# Patient Record
Sex: Female | Born: 1945 | Race: White | Hispanic: No | Marital: Married | Smoking: Former smoker
Health system: Southern US, Community
[De-identification: ages and names within clinical notes are randomized; demographics above are authoritative.]

## PROBLEM LIST (undated history)

## (undated) DIAGNOSIS — L259 Unspecified contact dermatitis, unspecified cause: Secondary | ICD-10-CM

## (undated) DIAGNOSIS — E785 Hyperlipidemia, unspecified: Secondary | ICD-10-CM

## (undated) DIAGNOSIS — N183 Chronic kidney disease, stage 3 unspecified: Secondary | ICD-10-CM

## (undated) DIAGNOSIS — M1990 Unspecified osteoarthritis, unspecified site: Secondary | ICD-10-CM

## (undated) DIAGNOSIS — Z124 Encounter for screening for malignant neoplasm of cervix: Secondary | ICD-10-CM

## (undated) DIAGNOSIS — H269 Unspecified cataract: Secondary | ICD-10-CM

## (undated) DIAGNOSIS — M25579 Pain in unspecified ankle and joints of unspecified foot: Secondary | ICD-10-CM

## (undated) DIAGNOSIS — T7840XA Allergy, unspecified, initial encounter: Secondary | ICD-10-CM

## (undated) DIAGNOSIS — I10 Essential (primary) hypertension: Secondary | ICD-10-CM

## (undated) DIAGNOSIS — K219 Gastro-esophageal reflux disease without esophagitis: Secondary | ICD-10-CM

## (undated) DIAGNOSIS — F32A Depression, unspecified: Secondary | ICD-10-CM

## (undated) DIAGNOSIS — F329 Major depressive disorder, single episode, unspecified: Secondary | ICD-10-CM

## (undated) DIAGNOSIS — M8580 Other specified disorders of bone density and structure, unspecified site: Secondary | ICD-10-CM

## (undated) DIAGNOSIS — Z79899 Other long term (current) drug therapy: Secondary | ICD-10-CM

## (undated) HISTORY — PX: FINGER SURGERY: SHX640

## (undated) HISTORY — DX: Chronic kidney disease, stage 3 unspecified: N18.30

## (undated) HISTORY — PX: JOINT REPLACEMENT: SHX530

## (undated) HISTORY — DX: Unspecified cataract: H26.9

## (undated) HISTORY — PX: UPPER GASTROINTESTINAL ENDOSCOPY: SHX188

## (undated) HISTORY — DX: Encounter for screening for malignant neoplasm of cervix: Z12.4

## (undated) HISTORY — DX: Unspecified contact dermatitis, unspecified cause: L25.9

## (undated) HISTORY — DX: Hyperlipidemia, unspecified: E78.5

## (undated) HISTORY — DX: Other specified disorders of bone density and structure, unspecified site: M85.80

## (undated) HISTORY — PX: TONSILLECTOMY: SUR1361

## (undated) HISTORY — DX: Allergy, unspecified, initial encounter: T78.40XA

## (undated) HISTORY — DX: Unspecified osteoarthritis, unspecified site: M19.90

## (undated) HISTORY — DX: Pain in unspecified ankle and joints of unspecified foot: M25.579

## (undated) HISTORY — PX: OTHER SURGICAL HISTORY: SHX169

## (undated) HISTORY — DX: Gastro-esophageal reflux disease without esophagitis: K21.9

## (undated) HISTORY — PX: COLONOSCOPY: SHX174

## (undated) HISTORY — DX: Other long term (current) drug therapy: Z79.899

---

## 1967-02-11 HISTORY — PX: RHINOPLASTY: SUR1284

## 1978-02-10 HISTORY — PX: TUBAL LIGATION: SHX77

## 1978-02-10 HISTORY — PX: DIAGNOSTIC LAPAROSCOPY: SUR761

## 2003-12-15 ENCOUNTER — Encounter: Admission: RE | Admit: 2003-12-15 | Discharge: 2003-12-15 | Payer: Self-pay | Admitting: Family Medicine

## 2004-12-16 ENCOUNTER — Encounter: Admission: RE | Admit: 2004-12-16 | Discharge: 2004-12-16 | Payer: Self-pay | Admitting: Internal Medicine

## 2005-12-17 ENCOUNTER — Encounter: Admission: RE | Admit: 2005-12-17 | Discharge: 2005-12-17 | Payer: Self-pay | Admitting: *Deleted

## 2005-12-18 ENCOUNTER — Ambulatory Visit: Payer: Self-pay | Admitting: Internal Medicine

## 2006-01-06 ENCOUNTER — Ambulatory Visit: Payer: Self-pay | Admitting: Internal Medicine

## 2006-02-27 ENCOUNTER — Encounter: Admission: RE | Admit: 2006-02-27 | Discharge: 2006-02-27 | Payer: Self-pay | Admitting: Orthopedic Surgery

## 2006-05-12 HISTORY — PX: ANKLE SURGERY: SHX546

## 2006-12-31 ENCOUNTER — Encounter: Admission: RE | Admit: 2006-12-31 | Discharge: 2006-12-31 | Payer: Self-pay | Admitting: *Deleted

## 2007-05-12 HISTORY — PX: KNEE ARTHROPLASTY: SHX992

## 2007-11-05 ENCOUNTER — Inpatient Hospital Stay (HOSPITAL_COMMUNITY): Admission: RE | Admit: 2007-11-05 | Discharge: 2007-11-08 | Payer: Self-pay | Admitting: Orthopedic Surgery

## 2008-01-03 ENCOUNTER — Encounter: Admission: RE | Admit: 2008-01-03 | Discharge: 2008-01-03 | Payer: Self-pay | Admitting: *Deleted

## 2009-01-31 ENCOUNTER — Encounter: Admission: RE | Admit: 2009-01-31 | Discharge: 2009-01-31 | Payer: Self-pay | Admitting: Internal Medicine

## 2009-12-04 ENCOUNTER — Ambulatory Visit (HOSPITAL_COMMUNITY): Admission: RE | Admit: 2009-12-04 | Discharge: 2009-12-04 | Payer: Self-pay | Admitting: Orthopedic Surgery

## 2010-03-02 ENCOUNTER — Encounter: Payer: Self-pay | Admitting: Orthopedic Surgery

## 2010-05-13 LAB — HM MAMMOGRAPHY: HM Mammogram: NEGATIVE

## 2010-06-28 NOTE — Discharge Summary (Signed)
[name], [name]          ACCOUNT NO.:  [id number]   MEDICAL RECORD NO.:  [id number]          PATIENT TYPE:  INP   LOCATION:  5024                         FACILITY:  [facility]   PHYSICIAN:  Almedia Balls. Ranell Patrick, M.D. DATE OF BIRTH:  Jan 24, 1946   DATE OF ADMISSION:  11/05/2007  DATE OF DISCHARGE:  11/08/2007                               DISCHARGE SUMMARY   ADMITTING DIAGNOSIS:  Left knee end-stage osteoarthritis.   DISCHARGE DIAGNOSIS:  Left knee end-stage osteoarthritis.   PROCEDURE PERFORMED:  Left total knee replacement performed on November 05, 2007.   HISTORY OF PRESENT ILLNESS:  The patient is a 65 year old female with  worsening arthritis of the left knee.  She has failed conservative  management consistent with arthroscopy as well as injections.  Viscosupplementation therapy active modification presents now for end-  stage arthritis of the knee with disabling pain and functional loss,  desires a total knee replacement.  For further details in patient's past  medical history and physical examination, please see the medical record.   HOSPITAL COURSE:  The patient admitted to Orthopedics on November 05, 2007, where the patient underwent uncomplicated total knee arthroplasty.  The patient did well postoperatively, is up and ambulating and  weightbearing as tolerated.  She was comfortable on oral medication  prior to discharge.  She was discharged on regular diet.  Stable  condition on anticoagulant including Coumadin.  The patient to be  anticoagulated for 30 days.  She will follow up in Orthopedics in 2  weeks.      Almedia Balls. Ranell Patrick, M.D.  Electronically Signed     SRN/MEDQ  D:  12/14/2007  T:  12/15/2007  Job:  [id number]

## 2010-06-28 NOTE — Op Note (Signed)
[name], [name]          ACCOUNT NO.:  [id number]   MEDICAL RECORD NO.:  [id number]          PATIENT TYPE:  INP   LOCATION:  5024                         FACILITY:  [facility]   PHYSICIAN:  Almedia Balls. Ranell Patrick, M.D. DATE OF BIRTH:  03/15/1945   DATE OF PROCEDURE:  DATE OF DISCHARGE:  11/08/2007                               OPERATIVE REPORT   PREOPERATIVE DIAGNOSIS:  Left knee osteoarthritis, end stage.   POSTOPERATIVE DIAGNOSIS:  Left knee osteoarthritis, end stage.   PROCEDURE PERFORMED:  Left knee total knee replacement using a DePuy  segment rotating platform prosthesis.   ATTENDING SURGEON:  Almedia Balls. Ranell Patrick, MD   ASSISTANTAlbin Felling, one of the nurses in the [facility] operating room.   ANESTHESIA:  Spinal anesthesia was used.   ESTIMATED BLOOD LOSS:  Minimal.   FLUID REPLACEMENT:  2000 mL crystalloid.   TOURNIQUET TIME:  One hour and thirty minutes at 300 mmHg.   INSTRUMENT COUNT:  Correct.   COMPLICATIONS:  None.   Perioperative antibiotics were given.   INDICATIONS:  The patient is a 65 year old female presents who presents  with a history of worsening left knee pain secondary to end-stage  osteoarthritis.  The patient has failed conservative management, desires  operative treatment to restore function and eliminate pain.  Informed  consent was obtained.   DESCRIPTION OF THE PROCEDURE:  After an adequate level of spinal  anesthesia was achieved, the patient was positioned supine on the  operating table.  A nonsterile tourniquet was placed on the left  proximal thigh and left leg sterilely prepped and draped in the usual  manner.  After exsanguination of the limb using the Esmarch bandage, we  elevated the tourniquet to 300 mmHg, a longitudinal midline incision was  created over the knee in flexion.  Performed a medial parapatellar  arthrotomy.  We everted  the patella, opened the distal femur using a  step-cut drill, used distal femoral resection guide  intramedullary to  resect 10 mm off the affected left side.  This was set at 5 degrees  left.  We then proceeded to size the femur.  Anterior and posteriorly  with size 3.  We then performed anterior-posterior chamfer cuts with a  four-in-one block with appropriate attention towards the intercondylar  and epicondylar axis.  At this point, we went ahead and resected the  ACL, PCL, and remaining meniscal tissue.  We subluxed the tibia  anteriorly and performed a tibial cut, 90 degrees perpendicular to long  axis of the tibia, resecting 4 mm off the affected side.  We then  checked her gaps.  We had nice 10-mm symmetric gaps in extension and  flexion.  We then went ahead and finished the tibial preparation with  the modular step cut punch and drill.  In this, we sized to a size 3 on  the tibia as well.  Once we completed tibial preparation and resected  the bone on the femur for the posterior cruciate substituting prosthesis  using the box-cut guide, we then placed raw components on and sized 3  left femur and size 3 tibia.  We reduced  the knee and trialed with both  size 12.5 and also a size 15 insert.  The 12.5 seem to fit well.  We  then went ahead and resurfaced the patella with a freehand technique,  starting at 24-mm thickness and going down to size 16 and this was a 32  patella.  Following this, we went ahead and removed all trial  components.  We went ahead pulse irrigated the femur and the tibia.  We  plugged the end of the femur using available bone.  We then went ahead  and cemented the components in place using diffuse DePuy SmartSet high  viscosity cement.  Once the components were allowed to harden with the  knee in extension, we removed excess cement using 0.25-inch curved  osteotomes.  We then trialed again with a 12.5 and a 15 insert, the 15  fit well and we went and selected a 15 polyethylene.  With that in  place, we were able to achieve full extension and good balance both  in  flexion and extension.  The patellar tracked nicely.  At this point, we  closed the knee using PDS suture for the parapatellar arthrotomy,  subcutaneous closure with 2-0 Vicryl, and staples for skin.  Sterile  compressive dressing was applied followed by a knee immobilizer.  The  patient tolerated surgery well.      Almedia Balls. Ranell Patrick, M.D.  Electronically Signed     SRN/MEDQ  D:  11/18/2007  T:  11/19/2007  Job:  [id number]

## 2010-06-28 NOTE — H&P (Signed)
[name], [name]          ACCOUNT NO.:  [id number]   MEDICAL RECORD NO.:  [id number]         PATIENT TYPE:  CINP   LOCATION:                               FACILITY:  [facility]   PHYSICIAN:  Almedia Balls. Ranell Patrick, M.D. DATE OF BIRTH:  01-02-1946   DATE OF ADMISSION:  11/05/2007  DATE OF DISCHARGE:                              HISTORY & PHYSICAL   CHIEF COMPLAINTS:  Left knee pain.   HISTORY OF PRESENT ILLNESS:  The patient is a 65 year old female with  worsening left knee pain, which has been refractory to conservative  treatment.  The patient elected to have a left total knee arthroplasty  by Dr. Malon Kindle.   PAST MEDICAL HISTORY:  Hypertension, hyperlipidemia, and osteoarthritis.   FAMILY MEDICAL HISTORY:  Negative.   SOCIAL HISTORY:  The patient is married.  Primary care physician, Dr.  Zack Seal.  Does not smoke.  Occasional alcohol use.   ALLERGIES:  None.   CURRENT MEDICATIONS:  1. Fosamax 70 mg p.o. daily.  2. Aspirin 81 mg p.o. daily.  3. Simvastatin 40 mg p.o. daily.  4. Multivitamin.  5. Hydrochlorothiazide 12.5 mg p.o. daily.  6. Celebrex 200 mg p.o. daily.  7. Calcium 750 mg p.o. daily.   REVIEW OF SYSTEMS:  Pain with ambulation.   PHYSICAL EXAMINATION:  Pulse 76, respirations 16, and blood pressure  120/82.  The patient is a healthy-appearing 65 year old female, in no  acute distress.  Pleasant mood and affect and oriented x3.  Head and  neck exam shows cranial nerves II-XII grossly intact.  Full range of  motion of the cervical spine without any tenderness.  Chest has active  breath sounds bilaterally.  No wheezes, rhonchi, or rales.  Heart shows  regular rate and rhythm with no murmur.  Abdomen is nontender and  nondistended with active bowel sounds.  Extremities shows moderate  tenderness and crepitus of the left knee especially in the medial joint  line.  No effusion.  No rashes and no edema.   X-rays show end-stage osteoarthritis to the left  knee.   IMPRESSION:  End-stage osteoarthritis, left knee.   PLAN OF ACTION:  Left total knee arthroplasty by Dr. Malon Kindle.      Thomas B. Durwin Nora, P.A.      Almedia Balls. Ranell Patrick, M.D.  Electronically Signed    TBD/MEDQ  D:  10/21/2007  T:  10/21/2007  Job:  [id number]

## 2010-07-03 ENCOUNTER — Other Ambulatory Visit: Payer: Self-pay | Admitting: Internal Medicine

## 2010-07-03 DIAGNOSIS — Z1231 Encounter for screening mammogram for malignant neoplasm of breast: Secondary | ICD-10-CM

## 2010-07-23 ENCOUNTER — Ambulatory Visit
Admission: RE | Admit: 2010-07-23 | Discharge: 2010-07-23 | Disposition: A | Discharge status: Home / Self Care | Payer: Medicare Other | Source: Ambulatory Visit | Attending: Internal Medicine | Admitting: Internal Medicine

## 2010-07-23 DIAGNOSIS — Z1231 Encounter for screening mammogram for malignant neoplasm of breast: Secondary | ICD-10-CM

## 2010-09-06 LAB — HM PAP SMEAR: HM Pap smear: NORMAL

## 2010-11-11 LAB — DIFFERENTIAL
Basophils Absolute: 0
Basophils Relative: 1
Eosinophils Absolute: 0.2
Eosinophils Relative: 2
Lymphocytes Relative: 34
Lymphs Abs: 2.3
Monocytes Absolute: 0.5
Monocytes Relative: 8
Neutro Abs: 3.8
Neutrophils Relative %: 56

## 2010-11-11 LAB — BASIC METABOLIC PANEL
BUN: 16
BUN: 6
BUN: 8
BUN: 9
CO2: 28
CO2: 28
CO2: 30
CO2: 32
Calcium: 7.9 — ABNORMAL LOW
Calcium: 8.1 — ABNORMAL LOW
Calcium: 9.7
Chloride: 100
Chloride: 104
Chloride: 105
Chloride: 99
Creatinine, Ser: 0.71
Creatinine, Ser: 0.77
Creatinine, Ser: 0.77
GFR calc Af Amer: >60
GFR calc Af Amer: >60
GFR calc Af Amer: >60
GFR calc non Af Amer: >60
GFR calc non Af Amer: >60
GFR calc non Af Amer: >60
Glucose, Bld: 105 — ABNORMAL HIGH
Glucose, Bld: 107 — ABNORMAL HIGH
Glucose, Bld: 119 — ABNORMAL HIGH
Glucose, Bld: 94
Potassium: 3.8
Potassium: 4.1
Potassium: 4.2
Potassium: 4.4
Sodium: 135
Sodium: 138
Sodium: 140

## 2010-11-11 LAB — CBC
HCT: 29.2 — ABNORMAL LOW
HCT: 31.2 — ABNORMAL LOW
HCT: 32 — ABNORMAL LOW
HCT: 42.3
Hemoglobin: 10.1 — ABNORMAL LOW
Hemoglobin: 10.4 — ABNORMAL LOW
Hemoglobin: 10.9 — ABNORMAL LOW
Hemoglobin: 14
MCHC: 33.2
MCHC: 33.4
MCHC: 34
MCV: 87.9
MCV: 88.1
MCV: 89.7
MCV: 89.9
Platelets: 136 — ABNORMAL LOW
Platelets: 141 — ABNORMAL LOW
Platelets: 226
RBC: 3.47 — ABNORMAL LOW
RBC: 3.64 — ABNORMAL LOW
RBC: 4.71
RDW: 12.9
RDW: 12.9
RDW: 13.1
WBC: 6.9
WBC: 7.9

## 2010-11-11 LAB — APTT: aPTT: 35

## 2010-11-11 LAB — URINALYSIS, ROUTINE W REFLEX MICROSCOPIC
Bilirubin Urine: NEGATIVE
Glucose, UA: NEGATIVE
Hgb urine dipstick: NEGATIVE
Ketones, ur: NEGATIVE
Nitrite: NEGATIVE
Protein, ur: NEGATIVE
Specific Gravity, Urine: 1.024
Urobilinogen, UA: 0.2
pH: 7.5

## 2010-11-11 LAB — PROTIME-INR
INR: 1
INR: 1.1
Prothrombin Time: 12.8
Prothrombin Time: 15

## 2010-11-11 LAB — TYPE AND SCREEN
ABO/RH(D): A POS
Antibody Screen: NEGATIVE

## 2010-11-11 LAB — ABO/RH: ABO/RH(D): A POS

## 2011-02-17 DIAGNOSIS — J019 Acute sinusitis, unspecified: Secondary | ICD-10-CM | POA: Diagnosis not present

## 2011-02-17 DIAGNOSIS — R030 Elevated blood-pressure reading, without diagnosis of hypertension: Secondary | ICD-10-CM | POA: Diagnosis not present

## 2011-09-01 DIAGNOSIS — M19079 Primary osteoarthritis, unspecified ankle and foot: Secondary | ICD-10-CM | POA: Diagnosis not present

## 2011-09-08 DIAGNOSIS — I10 Essential (primary) hypertension: Secondary | ICD-10-CM | POA: Diagnosis not present

## 2011-09-08 DIAGNOSIS — M159 Polyosteoarthritis, unspecified: Secondary | ICD-10-CM | POA: Diagnosis not present

## 2011-09-08 DIAGNOSIS — Z0000 Encounter for general adult medical examination without abnormal findings: Secondary | ICD-10-CM | POA: Diagnosis not present

## 2011-09-08 DIAGNOSIS — E785 Hyperlipidemia, unspecified: Secondary | ICD-10-CM | POA: Diagnosis not present

## 2011-09-09 ENCOUNTER — Other Ambulatory Visit: Payer: Self-pay | Admitting: Internal Medicine

## 2011-09-09 DIAGNOSIS — Z1231 Encounter for screening mammogram for malignant neoplasm of breast: Secondary | ICD-10-CM

## 2011-09-15 DIAGNOSIS — Z13820 Encounter for screening for osteoporosis: Secondary | ICD-10-CM | POA: Diagnosis not present

## 2011-09-25 ENCOUNTER — Ambulatory Visit: Payer: Medicare Other

## 2011-10-02 ENCOUNTER — Ambulatory Visit
Admission: RE | Admit: 2011-10-02 | Discharge: 2011-10-02 | Disposition: A | Discharge status: Home / Self Care | Payer: Medicare Other | Source: Ambulatory Visit | Attending: Internal Medicine | Admitting: Internal Medicine

## 2011-10-02 DIAGNOSIS — Z1231 Encounter for screening mammogram for malignant neoplasm of breast: Secondary | ICD-10-CM

## 2011-10-08 DIAGNOSIS — M19079 Primary osteoarthritis, unspecified ankle and foot: Secondary | ICD-10-CM | POA: Diagnosis not present

## 2011-10-17 ENCOUNTER — Encounter (HOSPITAL_COMMUNITY): Payer: Self-pay | Admitting: Pharmacy Technician

## 2011-10-22 NOTE — Pre-Procedure Instructions (Signed)
20 [name]  10/22/2011   Your procedure is scheduled on: Thurs, Sept 19 @ 9:15 AM  Report to [facility] at 7:15 AM.  Call this number if you have problems the morning of surgery: [phone]   Remember:   Do not eat food:After Midnight.        Do not wear jewelry, make-up or nail polish.  Do not wear lotions, powders, or perfumes. You may wear deodorant.  Do not shave 48 hours prior to surgery.  Do not bring valuables to the hospital.  Contacts, dentures or bridgework may not be worn into surgery.  Leave suitcase in the car. After surgery it may be brought to your room.  For patients admitted to the hospital, checkout time is 11:00 AM the day of discharge.   Patients discharged the day of surgery will not be allowed to drive home.    Special Instructions: CHG Shower Use Special Wash: 1/2 bottle night before surgery and 1/2 bottle morning of surgery.   Please read over the following fact sheets that you were given: Pain Booklet, Coughing and Deep Breathing, Total Joint Packet, MRSA Information and Surgical Site Infection Prevention

## 2011-10-23 ENCOUNTER — Encounter (HOSPITAL_COMMUNITY)
Admission: RE | Admit: 2011-10-23 | Discharge: 2011-10-23 | Disposition: A | Discharge status: Home / Self Care | Payer: Medicare Other | Source: Ambulatory Visit | Attending: Orthopedic Surgery | Admitting: Orthopedic Surgery

## 2011-10-23 ENCOUNTER — Encounter (HOSPITAL_COMMUNITY): Payer: Self-pay

## 2011-10-23 HISTORY — DX: Essential (primary) hypertension: I10

## 2011-10-23 LAB — CBC
Hemoglobin: 14.3 g/dL (ref 12.0–15.0)
MCH: 29.5 pg (ref 26.0–34.0)
MCHC: 33.5 g/dL (ref 30.0–36.0)
MCV: 88.2 fL (ref 78.0–100.0)
RBC: 4.84 MIL/uL (ref 3.87–5.11)
RDW: 12.7 % (ref 11.5–15.5)

## 2011-10-23 LAB — SURGICAL PCR SCREEN
MRSA, PCR: NEGATIVE
Staphylococcus aureus: NEGATIVE

## 2011-10-23 LAB — TYPE AND SCREEN: ABO/RH(D): A POS

## 2011-10-23 LAB — BASIC METABOLIC PANEL
BUN: 14 mg/dL (ref 6–23)
CO2: 29 mEq/L (ref 19–32)
Calcium: 9.3 mg/dL (ref 8.4–10.5)
GFR calc non Af Amer: 85 mL/min — ABNORMAL LOW (ref >90)
Glucose, Bld: 94 mg/dL (ref 70–99)
Potassium: 4.2 mEq/L (ref 3.5–5.1)

## 2011-10-23 NOTE — Progress Notes (Addendum)
Pt here for PAT visit.  Reports being seen in August by PA at [facility] in [place], [place] for preop work-up.  Reported that she was"ok'd" for surgery at that time. Requested CXR and recent OV notes from [facility]([place], [place]). Dr. Victorino Dike paged prior to appt for consent orders-never responded to call. Spoke w/ Alcario Drought re: lack of orders.  She stated she had left message x1 w/ office re: orders. Also, she spoke w/ Cordelia Pen on 10/22/11@1526  Re: need for orders.//L. Kashia Brossard,RN

## 2011-10-29 MED ORDER — CEFAZOLIN SODIUM-DEXTROSE 2-3 GM-% IV SOLR
2.0000 g | INTRAVENOUS | Status: AC
  Administered 2011-10-30: 2 g via INTRAVENOUS
  Filled 2011-10-29: qty 50

## 2011-10-30 ENCOUNTER — Encounter (HOSPITAL_COMMUNITY): Payer: Self-pay | Admitting: Anesthesiology

## 2011-10-30 ENCOUNTER — Encounter (HOSPITAL_COMMUNITY)
Admission: RE | Disposition: A | Discharge status: Home / Self Care | Payer: Self-pay | Source: Ambulatory Visit | Attending: Orthopedic Surgery

## 2011-10-30 ENCOUNTER — Encounter (HOSPITAL_COMMUNITY): Payer: Self-pay | Admitting: *Deleted

## 2011-10-30 ENCOUNTER — Inpatient Hospital Stay (HOSPITAL_COMMUNITY): Payer: Medicare Other | Admitting: Anesthesiology

## 2011-10-30 ENCOUNTER — Inpatient Hospital Stay (HOSPITAL_COMMUNITY): Payer: Medicare Other

## 2011-10-30 ENCOUNTER — Inpatient Hospital Stay (HOSPITAL_COMMUNITY)
Admission: RE | Admit: 2011-10-30 | Discharge: 2011-10-31 | DRG: 470 | Disposition: A | Discharge status: Home / Self Care | Payer: Medicare Other | Source: Ambulatory Visit | Attending: Orthopedic Surgery | Admitting: Orthopedic Surgery

## 2011-10-30 DIAGNOSIS — Z96669 Presence of unspecified artificial ankle joint: Secondary | ICD-10-CM | POA: Diagnosis not present

## 2011-10-30 DIAGNOSIS — I10 Essential (primary) hypertension: Secondary | ICD-10-CM | POA: Diagnosis present

## 2011-10-30 DIAGNOSIS — Z96659 Presence of unspecified artificial knee joint: Secondary | ICD-10-CM

## 2011-10-30 DIAGNOSIS — E669 Obesity, unspecified: Secondary | ICD-10-CM | POA: Diagnosis present

## 2011-10-30 DIAGNOSIS — E785 Hyperlipidemia, unspecified: Secondary | ICD-10-CM | POA: Diagnosis present

## 2011-10-30 DIAGNOSIS — Z01812 Encounter for preprocedural laboratory examination: Secondary | ICD-10-CM

## 2011-10-30 DIAGNOSIS — Z7982 Long term (current) use of aspirin: Secondary | ICD-10-CM | POA: Diagnosis not present

## 2011-10-30 DIAGNOSIS — Z01811 Encounter for preprocedural respiratory examination: Secondary | ICD-10-CM | POA: Diagnosis not present

## 2011-10-30 DIAGNOSIS — Z79899 Other long term (current) drug therapy: Secondary | ICD-10-CM | POA: Diagnosis not present

## 2011-10-30 DIAGNOSIS — Z87891 Personal history of nicotine dependence: Secondary | ICD-10-CM | POA: Diagnosis not present

## 2011-10-30 DIAGNOSIS — M19079 Primary osteoarthritis, unspecified ankle and foot: Principal | ICD-10-CM | POA: Diagnosis present

## 2011-10-30 DIAGNOSIS — M25579 Pain in unspecified ankle and joints of unspecified foot: Secondary | ICD-10-CM | POA: Diagnosis not present

## 2011-10-30 DIAGNOSIS — Z471 Aftercare following joint replacement surgery: Secondary | ICD-10-CM | POA: Diagnosis not present

## 2011-10-30 DIAGNOSIS — G8918 Other acute postprocedural pain: Secondary | ICD-10-CM | POA: Diagnosis not present

## 2011-10-30 HISTORY — PX: TOTAL ANKLE ARTHROPLASTY: SHX811

## 2011-10-30 HISTORY — DX: Hyperlipidemia, unspecified: E78.5

## 2011-10-30 SURGERY — ARTHROPLASTY, ANKLE, TOTAL
Anesthesia: General | Site: Ankle | Laterality: Right | Wound class: Clean

## 2011-10-30 MED ORDER — DEXAMETHASONE SODIUM PHOSPHATE 4 MG/ML IJ SOLN
INTRAMUSCULAR | Status: DC | PRN
  Administered 2011-10-30: 4 mg

## 2011-10-30 MED ORDER — ADULT MULTIVITAMIN W/MINERALS CH
1.0000 | ORAL_TABLET | Freq: Every day | ORAL | Status: DC
  Administered 2011-10-30: 1 via ORAL
  Filled 2011-10-30 (×2): qty 1

## 2011-10-30 MED ORDER — SODIUM CHLORIDE 0.9 % IV SOLN: INTRAVENOUS | Status: DC

## 2011-10-30 MED ORDER — DIPHENHYDRAMINE HCL 12.5 MG/5ML PO ELIX: 12.5000 mg | ORAL_SOLUTION | ORAL | Status: DC | PRN

## 2011-10-30 MED ORDER — METHOCARBAMOL 100 MG/ML IJ SOLN
500.0000 mg | Freq: Four times a day (QID) | INTRAVENOUS | Status: DC | PRN
  Filled 2011-10-30: qty 5

## 2011-10-30 MED ORDER — ACETAMINOPHEN 10 MG/ML IV SOLN
INTRAVENOUS | Status: DC | PRN
  Administered 2011-10-30: 1000 mg via INTRAVENOUS

## 2011-10-30 MED ORDER — SIMVASTATIN 20 MG PO TABS
20.0000 mg | ORAL_TABLET | Freq: Every evening | ORAL | Status: DC
  Administered 2011-10-30: 20 mg via ORAL
  Filled 2011-10-30 (×2): qty 1

## 2011-10-30 MED ORDER — MIDAZOLAM HCL 5 MG/5ML IJ SOLN
INTRAMUSCULAR | Status: DC | PRN
  Administered 2011-10-30: 2 mg via INTRAVENOUS

## 2011-10-30 MED ORDER — ZOLPIDEM TARTRATE 5 MG PO TABS: 5.0000 mg | ORAL_TABLET | Freq: Every evening | ORAL | Status: DC | PRN

## 2011-10-30 MED ORDER — SENNA 8.6 MG PO TABS
1.0000 | ORAL_TABLET | Freq: Two times a day (BID) | ORAL | Status: DC
  Administered 2011-10-30 (×2): 8.6 mg via ORAL
  Filled 2011-10-30 (×4): qty 1

## 2011-10-30 MED ORDER — ONDANSETRON HCL 4 MG PO TABS: 4.0000 mg | ORAL_TABLET | Freq: Four times a day (QID) | ORAL | Status: DC | PRN

## 2011-10-30 MED ORDER — ASPIRIN 325 MG PO TABS
325.0000 mg | ORAL_TABLET | Freq: Every day | ORAL | Status: DC
  Administered 2011-10-30: 325 mg via ORAL
  Filled 2011-10-30 (×2): qty 1

## 2011-10-30 MED ORDER — OXYCODONE-ACETAMINOPHEN 5-325 MG PO TABS
1.0000 | ORAL_TABLET | ORAL | Status: DC | PRN
  Administered 2011-10-30 – 2011-10-31 (×5): 2 via ORAL
  Filled 2011-10-30 (×5): qty 2

## 2011-10-30 MED ORDER — LACTATED RINGERS IV SOLN
INTRAVENOUS | Status: DC | PRN
  Administered 2011-10-30 (×2): via INTRAVENOUS

## 2011-10-30 MED ORDER — HYDROMORPHONE HCL PF 1 MG/ML IJ SOLN
0.2500 mg | INTRAMUSCULAR | Status: DC | PRN
  Administered 2011-10-30 (×2): 0.5 mg via INTRAVENOUS

## 2011-10-30 MED ORDER — LIDOCAINE HCL 1 % IJ SOLN
INTRAMUSCULAR | Status: DC | PRN
  Administered 2011-10-30: 50 mg via INTRADERMAL

## 2011-10-30 MED ORDER — BACITRACIN ZINC 500 UNIT/GM EX OINT
TOPICAL_OINTMENT | CUTANEOUS | Status: AC
  Filled 2011-10-30: qty 15

## 2011-10-30 MED ORDER — EPHEDRINE SULFATE 50 MG/ML IJ SOLN
INTRAMUSCULAR | Status: DC | PRN
  Administered 2011-10-30: 5 mg via INTRAVENOUS

## 2011-10-30 MED ORDER — DOCUSATE SODIUM 100 MG PO CAPS
100.0000 mg | ORAL_CAPSULE | Freq: Two times a day (BID) | ORAL | Status: DC
  Administered 2011-10-30 (×2): 100 mg via ORAL
  Filled 2011-10-30 (×4): qty 1

## 2011-10-30 MED ORDER — CEFAZOLIN SODIUM-DEXTROSE 2-3 GM-% IV SOLR
2.0000 g | Freq: Four times a day (QID) | INTRAVENOUS | Status: AC
  Administered 2011-10-30 – 2011-10-31 (×3): 2 g via INTRAVENOUS
  Filled 2011-10-30 (×3): qty 50

## 2011-10-30 MED ORDER — LACTATED RINGERS IV SOLN
INTRAVENOUS | Status: DC
  Administered 2011-10-30: 08:00:00 via INTRAVENOUS

## 2011-10-30 MED ORDER — FENTANYL CITRATE 0.05 MG/ML IJ SOLN
INTRAMUSCULAR | Status: AC
  Filled 2011-10-30: qty 2

## 2011-10-30 MED ORDER — METOCLOPRAMIDE HCL 5 MG/ML IJ SOLN: 5.0000 mg | Freq: Three times a day (TID) | INTRAMUSCULAR | Status: DC | PRN

## 2011-10-30 MED ORDER — CHLORHEXIDINE GLUCONATE 4 % EX LIQD: 60.0000 mL | Freq: Once | CUTANEOUS | Status: DC

## 2011-10-30 MED ORDER — BUPIVACAINE-EPINEPHRINE PF 0.5-1:200000 % IJ SOLN
INTRAMUSCULAR | Status: DC | PRN
  Administered 2011-10-30: 45 mL

## 2011-10-30 MED ORDER — ACETAMINOPHEN 10 MG/ML IV SOLN
INTRAVENOUS | Status: AC
  Filled 2011-10-30: qty 100

## 2011-10-30 MED ORDER — 0.9 % SODIUM CHLORIDE (POUR BTL) OPTIME
TOPICAL | Status: DC | PRN
  Administered 2011-10-30: 1000 mL

## 2011-10-30 MED ORDER — FENTANYL CITRATE 0.05 MG/ML IJ SOLN
50.0000 ug | Freq: Once | INTRAMUSCULAR | Status: AC
  Administered 2011-10-30: 100 ug via INTRAVENOUS

## 2011-10-30 MED ORDER — FENTANYL CITRATE 0.05 MG/ML IJ SOLN
INTRAMUSCULAR | Status: DC | PRN
  Administered 2011-10-30: 25 ug via INTRAVENOUS
  Administered 2011-10-30: 50 ug via INTRAVENOUS
  Administered 2011-10-30 (×3): 25 ug via INTRAVENOUS
  Administered 2011-10-30: 50 ug via INTRAVENOUS

## 2011-10-30 MED ORDER — VITAMIN D3 25 MCG (1000 UNIT) PO TABS
1000.0000 [IU] | ORAL_TABLET | Freq: Every day | ORAL | Status: DC
  Administered 2011-10-30: 1000 [IU] via ORAL
  Filled 2011-10-30 (×2): qty 1

## 2011-10-30 MED ORDER — VANCOMYCIN HCL 500 MG IV SOLR
INTRAVENOUS | Status: AC
  Filled 2011-10-30: qty 500

## 2011-10-30 MED ORDER — PROMETHAZINE HCL 25 MG/ML IJ SOLN: 6.2500 mg | INTRAMUSCULAR | Status: DC | PRN

## 2011-10-30 MED ORDER — PROPOFOL 10 MG/ML IV BOLUS
INTRAVENOUS | Status: DC | PRN
  Administered 2011-10-30: 140 mg via INTRAVENOUS

## 2011-10-30 MED ORDER — ONDANSETRON HCL 4 MG/2ML IJ SOLN
INTRAMUSCULAR | Status: DC | PRN
  Administered 2011-10-30: 4 mg via INTRAVENOUS

## 2011-10-30 MED ORDER — VANCOMYCIN HCL 500 MG IV SOLR
INTRAVENOUS | Status: DC | PRN
  Administered 2011-10-30: 500 mg

## 2011-10-30 MED ORDER — BACITRACIN ZINC 500 UNIT/GM EX OINT
TOPICAL_OINTMENT | CUTANEOUS | Status: DC | PRN
  Administered 2011-10-30: 1 via TOPICAL

## 2011-10-30 MED ORDER — MIDAZOLAM HCL 2 MG/2ML IJ SOLN
INTRAMUSCULAR | Status: AC
  Filled 2011-10-30: qty 2

## 2011-10-30 MED ORDER — MIDAZOLAM HCL 2 MG/2ML IJ SOLN
1.0000 mg | INTRAMUSCULAR | Status: DC | PRN
  Administered 2011-10-30: 2 mg via INTRAVENOUS

## 2011-10-30 MED ORDER — HYDROMORPHONE HCL PF 1 MG/ML IJ SOLN
INTRAMUSCULAR | Status: AC
  Filled 2011-10-30: qty 1

## 2011-10-30 MED ORDER — ONDANSETRON HCL 4 MG/2ML IJ SOLN: 4.0000 mg | Freq: Four times a day (QID) | INTRAMUSCULAR | Status: DC | PRN

## 2011-10-30 MED ORDER — METOCLOPRAMIDE HCL 10 MG PO TABS: 5.0000 mg | ORAL_TABLET | Freq: Three times a day (TID) | ORAL | Status: DC | PRN

## 2011-10-30 MED ORDER — METHOCARBAMOL 500 MG PO TABS
500.0000 mg | ORAL_TABLET | Freq: Four times a day (QID) | ORAL | Status: DC | PRN
  Administered 2011-10-30: 500 mg via ORAL
  Filled 2011-10-30: qty 1

## 2011-10-30 MED ORDER — HYDROMORPHONE HCL PF 1 MG/ML IJ SOLN: 0.5000 mg | INTRAMUSCULAR | Status: DC | PRN

## 2011-10-30 SURGICAL SUPPLY — 62 items
BANDAGE ESMARK 6X9 LF (GAUZE/BANDAGES/DRESSINGS) ×2 IMPLANT
BANDAGE GAUZE ELAST BULKY 4 IN (GAUZE/BANDAGES/DRESSINGS) ×2 IMPLANT
BLADE LONG MED 31X9 (MISCELLANEOUS) ×1 IMPLANT
BLADE RECIPRO TAPERED (BLADE) ×3 IMPLANT
BLADE SAGITTAL (BLADE) ×3
BLADE SAW THK.89X75X18XSGTL (BLADE) ×2 IMPLANT
BLADE SURG 15 STRL LF DISP TIS (BLADE) ×6 IMPLANT
BLADE SURG 15 STRL SS (BLADE) ×12
BNDG CMPR 9X6 STRL LF SNTH (GAUZE/BANDAGES/DRESSINGS) ×2
BNDG COHESIVE 4X5 TAN STRL (GAUZE/BANDAGES/DRESSINGS) ×2 IMPLANT
BNDG COHESIVE 6X5 TAN STRL LF (GAUZE/BANDAGES/DRESSINGS) ×2 IMPLANT
BNDG ESMARK 6X9 LF (GAUZE/BANDAGES/DRESSINGS) ×3
CHLORAPREP W/TINT 26ML (MISCELLANEOUS) ×3 IMPLANT
CLOTH BEACON ORANGE TIMEOUT ST (SAFETY) ×3 IMPLANT
COVER SURGICAL LIGHT HANDLE (MISCELLANEOUS) ×3 IMPLANT
CUFF TOURNIQUET SINGLE 34IN LL (TOURNIQUET CUFF) ×3 IMPLANT
CUFF TOURNIQUET SINGLE 44IN (TOURNIQUET CUFF) IMPLANT
DRAPE C-ARM 42X72 X-RAY (DRAPES) ×3 IMPLANT
DRAPE C-ARMOR (DRAPES) ×3 IMPLANT
DRAPE EXTREMITY T 121X128X90 (DRAPE) ×3 IMPLANT
DRAPE ORTHO SPLIT 77X108 STRL (DRAPES)
DRAPE SURG ORHT 6 SPLT 77X108 (DRAPES) ×1 IMPLANT
DRAPE U-SHAPE 47X51 STRL (DRAPES) ×3 IMPLANT
DRSG ADAPTIC 3X8 NADH LF (GAUZE/BANDAGES/DRESSINGS) ×1 IMPLANT
DRSG MEPILEX BORDER 4X4 (GAUZE/BANDAGES/DRESSINGS) ×1 IMPLANT
DRSG OPSITE 4X5.5 SM (GAUZE/BANDAGES/DRESSINGS) ×2 IMPLANT
DRSG PAD ABDOMINAL 8X10 ST (GAUZE/BANDAGES/DRESSINGS) ×9 IMPLANT
ELECT REM PT RETURN 9FT ADLT (ELECTROSURGICAL) ×3
ELECTRODE REM PT RTRN 9FT ADLT (ELECTROSURGICAL) ×2 IMPLANT
EVACUATOR 1/8 PVC DRAIN (DRAIN) ×1 IMPLANT
GEL ULTRASOUND 20GR AQUASONIC (MISCELLANEOUS) ×2 IMPLANT
GLOVE BIO SURGEON STRL SZ 6.5 (GLOVE) ×4 IMPLANT
GLOVE BIO SURGEON STRL SZ8 (GLOVE) ×3 IMPLANT
GLOVE BIOGEL PI IND STRL 6.5 (GLOVE) ×3 IMPLANT
GLOVE BIOGEL PI IND STRL 8 (GLOVE) ×5 IMPLANT
GLOVE BIOGEL PI INDICATOR 6.5 (GLOVE) ×3
GLOVE BIOGEL PI INDICATOR 8 (GLOVE) ×4
GLOVE ORTHO TXT STRL SZ7.5 (GLOVE) ×1 IMPLANT
GLOVE SURG SS PI 7.5 STRL IVOR (GLOVE) ×4 IMPLANT
GLOVE SURG SS PI 8.0 STRL IVOR (GLOVE) ×2 IMPLANT
GOWN PREVENTION PLUS XLARGE (GOWN DISPOSABLE) ×3 IMPLANT
GOWN STRL NON-REIN LRG LVL3 (GOWN DISPOSABLE) ×10 IMPLANT
IMPLANT TALAR STAR SZ XS RT (Orthopedic Implant) ×2 IMPLANT
IMPLANT TIBIAL STAR SZ M (Orthopedic Implant) ×2 IMPLANT
KIT BASIN OR (CUSTOM PROCEDURE TRAY) ×3 IMPLANT
KIT ROOM TURNOVER OR (KITS) ×3 IMPLANT
MANIFOLD NEPTUNE II (INSTRUMENTS) ×1 IMPLANT
NS IRRIG 1000ML POUR BTL (IV SOLUTION) ×3 IMPLANT
PACK TOTAL JOINT (CUSTOM PROCEDURE TRAY) ×3 IMPLANT
PAD ARMBOARD 7.5X6 YLW CONV (MISCELLANEOUS) ×6 IMPLANT
PAD CAST 4YDX4 CTTN HI CHSV (CAST SUPPLIES) ×5 IMPLANT
PADDING CAST COTTON 4X4 STRL (CAST SUPPLIES) ×6
PADDING CAST COTTON 6X4 STRL (CAST SUPPLIES) ×4 IMPLANT
SPONGE GAUZE 4X4 12PLY (GAUZE/BANDAGES/DRESSINGS) ×4 IMPLANT
SUCTION FRAZIER TIP 10 FR DISP (SUCTIONS) ×1 IMPLANT
SUT MNCRL AB 3-0 PS2 18 (SUTURE) ×5 IMPLANT
SUT PROLENE 3 0 PS 2 (SUTURE) ×5 IMPLANT
SUT VIC AB 0 CT1 27 (SUTURE) ×6
SUT VIC AB 0 CT1 27XBRD ANBCTR (SUTURE) ×3 IMPLANT
TOWEL OR 17X24 6PK STRL BLUE (TOWEL DISPOSABLE) ×3 IMPLANT
TOWEL OR 17X26 10 PK STRL BLUE (TOWEL DISPOSABLE) ×3 IMPLANT
WATER STERILE IRR 1000ML POUR (IV SOLUTION) ×1 IMPLANT

## 2011-10-30 NOTE — Progress Notes (Signed)
UR COMPLETED  

## 2011-10-30 NOTE — Anesthesia Preprocedure Evaluation (Addendum)
Anesthesia Evaluation  Patient identified by MRN, date of birth, ID band Patient awake    Reviewed: Allergy & Precautions, H&P , NPO status , Patient's Chart, lab work & pertinent test results  History of Anesthesia Complications Negative for: history of anesthetic complications  Airway Mallampati: I TM Distance: >3 FB Neck ROM: Full    Dental  (+) Teeth Intact and Dental Advisory Given   Pulmonary neg pulmonary ROS, former smoker,  breath sounds clear to auscultation        Cardiovascular hypertension (off meds x 1 year by MD), Rhythm:Regular Rate:Normal     Neuro/Psych negative neurological ROS  negative psych ROS   GI/Hepatic negative GI ROS, Neg liver ROS,   Endo/Other  negative endocrine ROS  Renal/GU negative Renal ROS  negative genitourinary   Musculoskeletal  (+) Arthritis -, Osteoarthritis,    Abdominal (+) + obese,   Peds negative pediatric ROS (+)  Hematology negative hematology ROS (+)   Anesthesia Other Findings   Reproductive/Obstetrics negative OB ROS                         Anesthesia Physical Anesthesia Plan  ASA: II  Anesthesia Plan: General   Post-op Pain Management:    Induction: Intravenous  Airway Management Planned: LMA  Additional Equipment:   Intra-op Plan:   Post-operative Plan: Extubation in OR  Informed Consent: I have reviewed the patients History and Physical, chart, labs and discussed the procedure including the risks, benefits and alternatives for the proposed anesthesia with the patient or authorized representative who has indicated his/her understanding and acceptance.     Plan Discussed with: CRNA and Surgeon  Anesthesia Plan Comments:         Anesthesia Quick Evaluation

## 2011-10-30 NOTE — Transfer of Care (Signed)
Immediate Anesthesia Transfer of Care Note  Patient: [name]  Procedure(s) Performed: Procedure(s) (LRB) with comments: TOTAL ANKLE ARTHOPLASTY (Right) - RIGHT TOTAL ANKLE REPLACEMENT, POSSIBLE GASTROC RESESSION  Patient Location: PACU  Anesthesia Type: General and Regional  Level of Consciousness: awake, alert  and oriented  Airway & Oxygen Therapy: Patient Spontanous Breathing and Patient connected to nasal cannula oxygen  Post-op Assessment: Report given to PACU RN  Post vital signs: Reviewed and stable  Complications: No apparent anesthesia complications

## 2011-10-30 NOTE — Anesthesia Postprocedure Evaluation (Signed)
  Anesthesia Post-op Note  Patient: [name]  Procedure(s) Performed: Procedure(s) (LRB) with comments: TOTAL ANKLE ARTHOPLASTY (Right) - RIGHT TOTAL ANKLE REPLACEMENT, POSSIBLE GASTROC RESESSION  Patient Location: PACU  Anesthesia Type: GA combined with regional for post-op pain  Level of Consciousness: awake, oriented, sedated and patient cooperative  Airway and Oxygen Therapy: Patient Spontanous Breathing  Post-op Pain: mild  Post-op Assessment: Post-op Vital signs reviewed, Patient's Cardiovascular Status Stable, Respiratory Function Stable, Patent Airway, No signs of Nausea or vomiting and Pain level controlled  Post-op Vital Signs: stable  Complications: No apparent anesthesia complications

## 2011-10-30 NOTE — H&P (Signed)
[name] is an 66 y.o. female.   Chief Complaint: right ankle pain HPI: 66 y/o female with h/o right ankle pain for many years.  She has failed treatment with activity modification, bracing, steroid injections and NSAIDs.  She presents now for right total ankle replacement.  Past Medical History  Diagnosis Date  . Hypertension     been resolved >1 year.  . Hyperlipidemia     Past Surgical History  Procedure Date  . Joint replacement     Total Knee(L)-11/05/07  . Ankle surgery 05/2006    Right Ankle  . Tonsillectomy     @ 66 y.o.  . Diagnostic laparoscopy 1980  . Tubal ligation 1980  . Rhinoplasty 1969  . Carpel tunnel release [id number]    [id number]), Left(2006)  . Knee arthroplasty 05/2007    Left    History reviewed. No pertinent family history. Social History:  reports that she quit smoking about 31 years ago. Her smoking use included Cigarettes. She has a 16 pack-year smoking history. She does not have any smokeless tobacco history on file. Her alcohol and drug histories not on file.  Allergies:  Allergies  Allergen Reactions  . Morphine And Related Itching    Medications Prior to Admission  Medication Sig Dispense Refill  . aspirin EC 81 MG tablet Take 81 mg by mouth daily.      . cholecalciferol (VITAMIN D) 1000 UNITS tablet Take 1,000 Units by mouth daily.      . Multiple Vitamin (MULTIVITAMIN WITH MINERALS) TABS Take 1 tablet by mouth daily.      . simvastatin (ZOCOR) 20 MG tablet Take 20 mg by mouth every evening.        No results found for this or any previous visit (from the past 48 hour(s)). Dg Chest 2 View  November 17, 2011  *RADIOLOGY REPORT*  Clinical Data: Preoperative radiograph for total knee arthroplasty.  CHEST - 2 VIEW  Comparison: 11/02/2007  Findings: Heart size upper normal to mildly enlarged. No focal consolidation, pleural effusion, or pneumothorax.  Dextro scoliosis of the spine is unchanged. No acute osseous finding.  IMPRESSION: No  radiographic evidence of acute cardiopulmonary process.   Original Report Authenticated By: Waneta Martins, M.D.     ROS  No recent f/c/n/v/wt loss.  Blood pressure 114/75, pulse 72, temperature 98.4 F (36.9 C), temperature source Oral, resp. rate 18, SpO2 95.00%. Physical Exam wn wd woman in nad.  A and O x 4.  Mood and affect normal.  EOMI.  Respirations unlabored.  R ankle without gross deformity.  Skin healthy and intact.  2+ dp and PT pulses.  Feels LT throughout the foot and ankle.  5/5 strength in PF and DF of ankle.  Assessment/Plan Right ankle arthritis - to OR for total ankle replacement.  The risks and benefits of the alternative treatment options have been discussed in detail.  The patient wishes to proceed with surgery and specifically understands risks of bleeding, infection, nerve damage, blood clots, need for additional surgery, amputation and death.   Toni Arthurs 11/17/11, 8:40 AM

## 2011-10-30 NOTE — Brief Op Note (Signed)
10/30/2011  12:31 PM  PATIENT:  Austin Miles Eckersley  66 y.o. female  PRE-OPERATIVE DIAGNOSIS:  RIGHT ANKLE ARTHRITIS  POST-OPERATIVE DIAGNOSIS:  RIGHT ANKLE ARTHRITIS  Procedure(s): 1.  Right total ankle replacement 2.  Fluoro > 1 hour  SURGEON:  Toni Arthurs, MD  ASSISTANT: Leonides Grills, MD  ANESTHESIA:   General, regional  EBL:  minimal   TOURNIQUET:   Total Tourniquet Time Documented: Thigh (Right) - 135 minutes  COMPLICATIONS:  None apparent  DISPOSITION:  Extubated, awake and stable to recovery.  DICTATION ID:  [id number]

## 2011-10-30 NOTE — Preoperative (Signed)
Beta Blockers   Reason not to administer Beta Blockers:Not Applicable 

## 2011-10-30 NOTE — Anesthesia Procedure Notes (Signed)
Anesthesia Regional Block:  Popliteal block  Pre-Anesthetic Checklist: ,, timeout performed, Correct Patient, Correct Site, Correct Laterality, Correct Procedure, Correct Position, site marked, Risks and benefits discussed,  Surgical consent,  Pre-op evaluation,  At surgeon's request and post-op pain management  Laterality: Right  Prep: chloraprep       Needles:  Injection technique: Single-shot  Needle Type: Echogenic Stimulator Needle     Needle Length:cm 9 cm Needle Gauge: 22 and 22 G    Additional Needles:  Procedures: nerve stimulator Popliteal block  Nerve Stimulator or Paresthesia:  Response: 0.48 mA,   Additional Responses:   Narrative:  Start time: 10/30/2011 8:55 AM End time: 10/30/2011 9:09 AM Injection made incrementally with aspirations every 5 mL. Anesthesiologist: Dr Gypsy Balsam  Additional Notes: [id number] R Popliteal Nerve Block POP CHG prep, sterile tech #22 stim/echo needle lateral approach using stim only down to .48ma Multiple neg asp Marc .5% w/epi 45cc+decadron 4mg  infiltrated No compl Dr Gypsy Balsam

## 2011-10-31 ENCOUNTER — Encounter (HOSPITAL_COMMUNITY): Payer: Self-pay | Admitting: Orthopedic Surgery

## 2011-10-31 MED ORDER — ASPIRIN 325 MG PO TABS: 325.0000 mg | ORAL_TABLET | Freq: Every day | ORAL | Status: DC

## 2011-10-31 MED ORDER — OXYCODONE HCL 5 MG PO TABS: 5.0000 mg | ORAL_TABLET | ORAL | Status: DC | PRN

## 2011-10-31 MED ORDER — SENNA 8.6 MG PO TABS: 1.0000 | ORAL_TABLET | Freq: Two times a day (BID) | ORAL | Status: DC

## 2011-10-31 MED ORDER — DSS 100 MG PO CAPS: 100.0000 mg | ORAL_CAPSULE | Freq: Two times a day (BID) | ORAL | Status: DC

## 2011-10-31 NOTE — Evaluation (Signed)
Physical Therapy Evaluation Patient Details Name: [name] MRN: [id number] DOB: 1945-06-09 Today's Date: 10/31/2011 Time: 6213-0865 PT Time Calculation (min): 19 min  PT Assessment / Plan / Recommendation Clinical Impression  66 y/o WF s/p R total ankle replacement presents with MOD I level of mobility with use of knee walker.  No PT needs identified and will d/c.    PT Assessment  Patent does not need any further PT services    Follow Up Recommendations  No PT follow up    Barriers to Discharge        Equipment Recommendations  None recommended by PT    Recommendations for Other Services     Frequency      Precautions / Restrictions Restrictions RLE Weight Bearing: Non weight bearing   Pertinent Vitals/Pain 0/10      Mobility  Bed Mobility Bed Mobility: Supine to Sit;Sitting - Scoot to Edge of Bed Supine to Sit: 7: Independent Sitting - Scoot to [place] of Bed: 7: Independent Transfers Transfers: Sit to Stand;Stand to [employer] Transfers Sit to Stand: 6: Modified independent (Device/Increase time) Stand to Sit: 6: Modified independent (Device/Increase time) Stand Pivot Transfers: 6: Modified independent (Device/Increase time) Ambulation/Gait Ambulation/Gait Assistance: 6: Modified independent (Device/Increase time) Ambulation Distance (Feet): 20 Feet Assistive device: Other (Comment) (knee walker) Ambulation/Gait Assistance Details: Pt able to maneuver in tight spaces with knee walker.    Exercises     PT Diagnosis:    PT Problem List:   PT Treatment Interventions:     PT Goals    Visit Information  Last PT Received On: 10/31/11 Assistance Needed: +1    Subjective Data  Subjective: "I've gotten up to the potty chair." Patient Stated Goal: Go home today.   Prior Functioning  Home Living Lives With: Spouse Available Help at Discharge: Family Type of Home: House Home Access: Level entry Home Layout: Two level;Able to live on main level  with bedroom/bathroom Bathroom Toilet: Handicapped height Home Adaptive Equipment: Other (comment) (knee walker) Prior Function Level of Independence: Independent Driving: Yes Vocation: Retired Musician: No difficulties    Cognition  Overall Cognitive Status: Appears within functional limits for tasks assessed/performed Arousal/Alertness: Awake/alert Orientation Level: Appears intact for tasks assessed Behavior During Session: [facility] for tasks performed    Extremity/Trunk Assessment Right Upper Extremity Assessment RUE ROM/Strength/Tone: [facility] for tasks assessed Left Upper Extremity Assessment LUE ROM/Strength/Tone: WFL for tasks assessed Right Lower Extremity Assessment RLE ROM/Strength/Tone: WFL for tasks assessed (short leg cast) Left Lower Extremity Assessment LLE ROM/Strength/Tone: WFL for tasks assessed   Balance    End of Session PT - End of Session Equipment Utilized During Treatment: Gait belt Activity Tolerance: Patient tolerated treatment well Patient left: in bed Nurse Communication: Mobility status  GP     Norris Brumbach LUBECK 10/31/2011, 10:13 AM

## 2011-10-31 NOTE — Op Note (Signed)
[name], [name]          ACCOUNT NO.:  [id number]  MEDICAL RECORD NO.:  [id number]  LOCATION:  [id number]                        FACILITY:  [facility]  PHYSICIAN:  Toni Arthurs, MD        DATE OF BIRTH:  1945/11/10  DATE OF PROCEDURE:  10/30/2011 DATE OF DISCHARGE:                              OPERATIVE REPORT   PREOPERATIVE DIAGNOSIS:  Right ankle arthritis.  POSTOPERATIVE DIAGNOSIS:  Right ankle arthritis.  PROCEDURE: 1. Right total ankle replacement. 2. Intraoperative interpretation of fluoroscopic imaging greater than     1 hour.  SURGEON:  Toni Arthurs, MD  FIRST ASSISTANT:  Leonides Grills, MD  ANESTHESIA:  General, regional.  ESTIMATED BLOOD LOSS:  Minimal.  TOURNIQUET TIME:  Two hours and 15 minutes at 300 mmHg.  COMPLICATIONS:  None apparent.  DISPOSITION:  Extubated, awake, and stable to recovery.  INDICATIONS FOR PROCEDURE:  The patient is a 66 year old woman with past medical history significant for right ankle fracture.  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  She has failed treatment with bracing, repeated steroid injections, activity modification, and oral pain medicines.  She understands risks and benefits, the alternative treatment options and elects surgical treatment.  She specifically understands risks of bleeding, infection, nerve damage, blood clots, need for additional surgery, amputation, and death.  PROCEDURE IN DETAIL:  After preoperative consent was obtained, the correct operative site was identified.  The patient was brought to the operating room and placed supine on the operating table.  General anesthesia was induced.  Preoperative antibiotics were administered. Surgical time-out was taken.  The right lower extremity was prepped and draped in standard sterile fashion with tourniquet around the thigh. The extremity was exsanguinated and tourniquet was inflated to 300  mmHg. An anterior incision was marked on the skin.  Sharp dissection was carried down through the skin and subcutaneous tissue, taking care to protect crossing branches of the superficial peroneal nerve.  The extensor retinaculum was incised over the EHL tendon.  It was released proximally and distally.  The interval between the EHL and tibialis anterior was then developed, taking care to protect the neurovascular bundle.  The anterior ankle joint was opened.  The joint capsule was released medially and laterally.  The deep deltoid ligament was released off the medial malleolus.  Overhanging osteophytes were removed.  A 3.2 mm pin was then inserted percutaneously at the level of the tibial tubercle.  This was inserted in line with a quarter-inch osteotome in the medial gutter.  The external tibial alignment guide for the STAR system was then applied and lined up with the medial corner of the ankle joint.  The proximal block was pinned in unicortical fashion.  The AP and lateral fluoroscopic images were obtained aligning the cutting guide with the tibial shaft in both planes.  The proximal block was then pinned securely with 2 pins.  The distal block was aligned with the tibial cut in the AP and lateral planes using the Angel wing, placed through the distal cutting block.  Distal block was pinned securely with 4 more pins.  The reciprocating saw was used to make the medial gutter  cut.  Distal tibial cut was then made with the oscillating saw.  The cutting guide was removed.  The bone fragments were removed distally. The sizing guide was inserted and was noted to have appropriate resection level.  The talar cutting guide was then applied and pinned into position after lateral fluoroscopic image confirmed appropriate position.  The talar cut was made with the oscillating saw.  The guide was removed.  The Taylor bone was removed.  The cut surface of the talus was incised as an extra-small.   The sizing guide was appropriately positioned in line with the second metatarsal and a pin was placed in the cut surface to mark this location.  The guide was removed and an extra-small anterior-posterior cutting guide was applied.  It was pinned into position after lateral fluoroscopic images confirmed appropriate position of the guidepin.  The anterior-posterior cuts were made and the guide was removed.  The medial lateral guide was then applied and pinned into position.  The medial and lateral cuts were made with reciprocating saw.  The guide was removed.  All waste bone was removed.  The medial and lateral gutters were cleaned of all bone.  The window trial was placed on the cut surface of the talus and lateral fluoroscopic view showed appropriate position.  It was pinned and the keel hole was drilled.  Guide was removed.  The keel punch was inserted.  The wound was irrigated copiously at this point.  All bony fragments were removed from the posterior ankle joint.  The medial and lateral gutters were again confirmed to be clear of all bony fragments.  The size extra small talar component was inserted and impacted.  Appropriate position was confirmed on the lateral x-ray.  At this point, a size large trial was inserted at the cut surface of the tibia, but was noted to be too large, so a size medium trial was inserted.  Reciprocating saw was used to resect a small amount of medial bone allowing appropriate positioning of the tibial trial.  The trial was pinned into position and the proximal holes were drilled.  The fin cuts were made.  The trial was removed and again the wound was irrigated copiously.  The final tibial implant was inserted and impacted into position.  The trial poly was placed and AP and lateral fluoroscopic images confirmed appropriate position of the tibial component.  At this point, the wound was again irrigated copiously.  A 10 mm trial poly was noted to fit well  with appropriate ankle ligament balance.  There was no impingement noted at the medial and lateral gutters.  The trial poly was removed and again the wound was irrigated copiously.  The final poly was inserted.  Final AP and lateral fluoroscopic images showed appropriate position of the components.  The anterior holes were bone grafted.  The ankle joint capsule was repaired with 0 Vicryl simple sutures.  The extensor retinaculum was closed with running 0 Vicryl and figure-of-eight 0 Vicryl sutures.  Subcutaneous tissue was approximated with inverted simple sutures of 3-0 Monocryl. The skin was closed with a running 3-0 Prolene.  Sterile dressings were applied followed by a compression wrap.  The tourniquet was released at 2 hours and 15 minutes after application of the dressing.  A short leg splint was applied with the ankle held in a neutral position.  The patient was then awakened from anesthesia and transported to recovery room in stable condition.  Dr. Lestine Box was present and scrubbed  for the duration of the operation.  His assistance was essential in gaining and maintaining exposure, placing the prosthesis and closing and splinting the extremity.  FOLLOWUP PLAN:  The patient will be nonweightbearing on the right lower extremity.  She will follow up with me in 3 weeks for suture removal and conversion to a cast or walking boot.    Toni Arthurs, MD    JH/MEDQ  D:  10/30/2011  T:  10/31/2011  Job:  [id number]

## 2011-10-31 NOTE — Care Management Note (Signed)
    Page 1 of 1   10/31/2011     11:49:45 AM   CARE MANAGEMENT NOTE 10/31/2011  Patient:  [name], [name]   Account Number:  [id number]  Date Initiated:  10/31/2011  Documentation initiated by:  Anette Guarneri  Subjective/Objective Assessment:   POD#1 s/p right ankle surgery  no f/u PT or DME needs  has knee walker at home, and RW     Action/Plan:   home with self care   Anticipated DC Date:  10/31/2011   Anticipated DC Plan:        DC Planning Services  CM consult      Choice offered to / List presented to:             Status of service:  Completed, signed off Medicare Important Message given?   (If response is "NO", the following Medicare IM given date fields will be blank) Date Medicare IM given:   Date Additional Medicare IM given:    Discharge Disposition:    Per UR Regulation:  Reviewed for med. necessity/level of care/duration of stay  If discussed at Long Length of Stay Meetings, dates discussed:    Comments:

## 2011-10-31 NOTE — Discharge Summary (Signed)
Physician Discharge Summary  Patient ID: [name] MRN: [id number] DOB/AGE: [birth year] 66 y.o.  Admit date: 10/30/2011 Discharge date: 10/31/2011  Admission Diagnoses:  Ankle arthritis  Discharge Diagnoses:  Ankle arthritis s/p total ankle replacement  Discharged Condition: stable  Hospital Course: pt was admitted and taken tot he OR on 9/19 for right total ankle replacement.  She tolerated the procedure well.  Her post operative course was unremarkable and she was discharged to home on 9/20.  Consults: None  Significant Diagnostic Studies: none  Treatments: therapies: PT  Discharge Exam: Blood pressure 139/75, pulse 84, temperature 97.7 F (36.5 C), temperature source Oral, resp. rate 18, height 5\' 2"  (1.575 m), weight 74.844 kg (165 lb), SpO2 96.00%. wn wd woman in nad.  a and O x 4.  mood and affect normal.  EOMI.  respirations unlabored.  R LE splinted.  brisk cap refill and normal sens to LT at toes.  Active PF ad DF at toes.  Disposition: to home  Discharge Orders    Future Orders Please Complete By Expires   Diet - low sodium heart healthy      Call MD / Call 911      Comments:   If you experience chest pain or shortness of breath, CALL 911 and be transported to the hospital emergency room.  If you develope a fever above 101 F, pus (white drainage) or increased drainage or redness at the wound, or calf pain, call your surgeon's office.   Constipation Prevention      Comments:   Drink plenty of fluids.  Prune juice may be helpful.  You may use a stool softener, such as Colace (over the counter) 100 mg twice a day.  Use MiraLax (over the counter) for constipation as needed.   Increase activity slowly as tolerated      Non weight bearing          Medication List     As of 10/31/2011  7:28 AM    STOP taking these medications         aspirin EC 81 MG tablet      TAKE these medications         aspirin 325 MG tablet   Take 1 tablet (325 mg total) by mouth  daily.      cholecalciferol 1000 UNITS tablet   Commonly known as: VITAMIN D   Take 1,000 Units by mouth daily.      DSS 100 MG Caps   Take 100 mg by mouth 2 (two) times daily.      multivitamin with minerals Tabs   Take 1 tablet by mouth daily.      oxyCODONE 5 MG immediate release tablet   Commonly known as: Oxy IR/ROXICODONE   Take 1-2 tablets (5-10 mg total) by mouth every 4 (four) hours as needed for pain.      senna 8.6 MG Tabs   Commonly known as: SENOKOT   Take 1 tablet (8.6 mg total) by mouth 2 (two) times daily.      simvastatin 20 MG tablet   Commonly known as: ZOCOR   Take 20 mg by mouth every evening.           Follow-up Information    Follow up with Aspasia Rude, Jonny Ruiz, MD. Schedule an appointment as soon as possible for a visit in 3 weeks.   Contact information:   [address] [place] [address] [phone]  SignedToni Arthurs 10/31/2011, 7:28 AM

## 2011-11-07 DIAGNOSIS — L02818 Cutaneous abscess of other sites: Secondary | ICD-10-CM | POA: Diagnosis not present

## 2011-11-07 DIAGNOSIS — L03818 Cellulitis of other sites: Secondary | ICD-10-CM | POA: Diagnosis not present

## 2011-12-03 DIAGNOSIS — Z23 Encounter for immunization: Secondary | ICD-10-CM | POA: Diagnosis not present

## 2011-12-10 DIAGNOSIS — Z9660 Presence of unspecified orthopedic joint implant: Secondary | ICD-10-CM | POA: Diagnosis not present

## 2012-01-02 DIAGNOSIS — M19079 Primary osteoarthritis, unspecified ankle and foot: Secondary | ICD-10-CM | POA: Diagnosis not present

## 2012-01-06 DIAGNOSIS — M19079 Primary osteoarthritis, unspecified ankle and foot: Secondary | ICD-10-CM | POA: Diagnosis not present

## 2012-01-13 DIAGNOSIS — M19079 Primary osteoarthritis, unspecified ankle and foot: Secondary | ICD-10-CM | POA: Diagnosis not present

## 2012-01-16 DIAGNOSIS — M19079 Primary osteoarthritis, unspecified ankle and foot: Secondary | ICD-10-CM | POA: Diagnosis not present

## 2012-01-20 DIAGNOSIS — M19079 Primary osteoarthritis, unspecified ankle and foot: Secondary | ICD-10-CM | POA: Diagnosis not present

## 2012-01-21 DIAGNOSIS — Z9660 Presence of unspecified orthopedic joint implant: Secondary | ICD-10-CM | POA: Diagnosis not present

## 2012-01-23 DIAGNOSIS — M19079 Primary osteoarthritis, unspecified ankle and foot: Secondary | ICD-10-CM | POA: Diagnosis not present

## 2012-01-28 DIAGNOSIS — M19079 Primary osteoarthritis, unspecified ankle and foot: Secondary | ICD-10-CM | POA: Diagnosis not present

## 2012-01-30 DIAGNOSIS — M19079 Primary osteoarthritis, unspecified ankle and foot: Secondary | ICD-10-CM | POA: Diagnosis not present

## 2012-02-02 DIAGNOSIS — M19079 Primary osteoarthritis, unspecified ankle and foot: Secondary | ICD-10-CM | POA: Diagnosis not present

## 2012-02-05 DIAGNOSIS — M19079 Primary osteoarthritis, unspecified ankle and foot: Secondary | ICD-10-CM | POA: Diagnosis not present

## 2012-02-09 DIAGNOSIS — M19079 Primary osteoarthritis, unspecified ankle and foot: Secondary | ICD-10-CM | POA: Diagnosis not present

## 2012-02-12 DIAGNOSIS — M19079 Primary osteoarthritis, unspecified ankle and foot: Secondary | ICD-10-CM | POA: Diagnosis not present

## 2012-02-18 DIAGNOSIS — M19079 Primary osteoarthritis, unspecified ankle and foot: Secondary | ICD-10-CM | POA: Diagnosis not present

## 2012-02-23 DIAGNOSIS — M19079 Primary osteoarthritis, unspecified ankle and foot: Secondary | ICD-10-CM | POA: Diagnosis not present

## 2012-02-26 DIAGNOSIS — M19079 Primary osteoarthritis, unspecified ankle and foot: Secondary | ICD-10-CM | POA: Diagnosis not present

## 2012-03-01 DIAGNOSIS — M19079 Primary osteoarthritis, unspecified ankle and foot: Secondary | ICD-10-CM | POA: Diagnosis not present

## 2012-03-04 DIAGNOSIS — M19079 Primary osteoarthritis, unspecified ankle and foot: Secondary | ICD-10-CM | POA: Diagnosis not present

## 2012-03-08 DIAGNOSIS — M19079 Primary osteoarthritis, unspecified ankle and foot: Secondary | ICD-10-CM | POA: Diagnosis not present

## 2012-03-10 DIAGNOSIS — H2510 Age-related nuclear cataract, unspecified eye: Secondary | ICD-10-CM | POA: Diagnosis not present

## 2012-03-11 DIAGNOSIS — M19079 Primary osteoarthritis, unspecified ankle and foot: Secondary | ICD-10-CM | POA: Diagnosis not present

## 2012-03-15 DIAGNOSIS — M19079 Primary osteoarthritis, unspecified ankle and foot: Secondary | ICD-10-CM | POA: Diagnosis not present

## 2012-03-18 DIAGNOSIS — M19079 Primary osteoarthritis, unspecified ankle and foot: Secondary | ICD-10-CM | POA: Diagnosis not present

## 2012-03-22 DIAGNOSIS — M19079 Primary osteoarthritis, unspecified ankle and foot: Secondary | ICD-10-CM | POA: Diagnosis not present

## 2012-03-23 DIAGNOSIS — M19079 Primary osteoarthritis, unspecified ankle and foot: Secondary | ICD-10-CM | POA: Diagnosis not present

## 2012-04-01 DIAGNOSIS — E785 Hyperlipidemia, unspecified: Secondary | ICD-10-CM | POA: Diagnosis not present

## 2012-04-01 DIAGNOSIS — M25579 Pain in unspecified ankle and joints of unspecified foot: Secondary | ICD-10-CM | POA: Diagnosis not present

## 2012-04-01 DIAGNOSIS — I10 Essential (primary) hypertension: Secondary | ICD-10-CM | POA: Diagnosis not present

## 2012-04-21 DIAGNOSIS — Z9660 Presence of unspecified orthopedic joint implant: Secondary | ICD-10-CM | POA: Diagnosis not present

## 2012-05-19 DIAGNOSIS — L570 Actinic keratosis: Secondary | ICD-10-CM | POA: Diagnosis not present

## 2012-07-13 DIAGNOSIS — M25579 Pain in unspecified ankle and joints of unspecified foot: Secondary | ICD-10-CM | POA: Diagnosis not present

## 2012-07-13 DIAGNOSIS — Z96669 Presence of unspecified artificial ankle joint: Secondary | ICD-10-CM | POA: Diagnosis not present

## 2012-08-16 DIAGNOSIS — Z96669 Presence of unspecified artificial ankle joint: Secondary | ICD-10-CM | POA: Diagnosis not present

## 2012-08-30 ENCOUNTER — Other Ambulatory Visit: Payer: Self-pay

## 2012-09-30 ENCOUNTER — Other Ambulatory Visit: Payer: Medicare Other

## 2012-09-30 ENCOUNTER — Other Ambulatory Visit: Payer: Self-pay | Admitting: *Deleted

## 2012-09-30 DIAGNOSIS — I10 Essential (primary) hypertension: Secondary | ICD-10-CM

## 2012-09-30 DIAGNOSIS — E785 Hyperlipidemia, unspecified: Secondary | ICD-10-CM

## 2012-10-01 ENCOUNTER — Encounter: Payer: Self-pay | Admitting: *Deleted

## 2012-10-01 LAB — CBC WITH DIFFERENTIAL/PLATELET
Eos: 3 % (ref 0–5)
Immature Grans (Abs): 0 10*3/uL (ref 0.0–0.1)
Immature Granulocytes: 0 % (ref 0–2)
Lymphs: 38 % (ref 14–46)
MCV: 88 fL (ref 79–97)
Monocytes Absolute: 0.4 10*3/uL (ref 0.1–0.9)
Neutrophils Relative %: 52 % (ref 40–74)
RDW: 13.4 % (ref 12.3–15.4)
WBC: 6.2 10*3/uL (ref 3.4–10.8)

## 2012-10-01 LAB — COMPREHENSIVE METABOLIC PANEL
ALT: 15 IU/L (ref 0–32)
AST: 20 IU/L (ref 0–40)
Albumin: 4 g/dL (ref 3.6–4.8)
Alkaline Phosphatase: 94 IU/L (ref 39–117)
BUN/Creatinine Ratio: 23 (ref 11–26)
Chloride: 101 mmol/L (ref 97–108)
GFR calc Af Amer: 82 mL/min/{1.73_m2} (ref >59)
Potassium: 4.6 mmol/L (ref 3.5–5.2)
Sodium: 141 mmol/L (ref 134–144)
Total Bilirubin: 0.3 mg/dL (ref 0.0–1.2)

## 2012-10-01 LAB — LIPID PANEL: HDL: 58 mg/dL (ref >39)

## 2012-10-04 ENCOUNTER — Encounter: Payer: Self-pay | Admitting: Nurse Practitioner

## 2012-10-04 ENCOUNTER — Ambulatory Visit: Payer: Self-pay | Admitting: Nurse Practitioner

## 2012-10-04 ENCOUNTER — Ambulatory Visit (INDEPENDENT_AMBULATORY_CARE_PROVIDER_SITE_OTHER): Payer: Medicare Other | Admitting: Nurse Practitioner

## 2012-10-04 VITALS — BP 122/76 | HR 76 | Resp 12 | Ht 61.5 in | Wt 162.0 lb

## 2012-10-04 DIAGNOSIS — M159 Polyosteoarthritis, unspecified: Secondary | ICD-10-CM

## 2012-10-04 DIAGNOSIS — Z23 Encounter for immunization: Secondary | ICD-10-CM

## 2012-10-04 DIAGNOSIS — E785 Hyperlipidemia, unspecified: Secondary | ICD-10-CM | POA: Diagnosis not present

## 2012-10-04 DIAGNOSIS — J309 Allergic rhinitis, unspecified: Secondary | ICD-10-CM | POA: Diagnosis not present

## 2012-10-04 DIAGNOSIS — G609 Hereditary and idiopathic neuropathy, unspecified: Secondary | ICD-10-CM | POA: Diagnosis not present

## 2012-10-04 DIAGNOSIS — G90529 Complex regional pain syndrome I of unspecified lower limb: Secondary | ICD-10-CM | POA: Insufficient documentation

## 2012-10-04 MED ORDER — VITAMIN D 1000 UNITS PO TABS: 1000.0000 [IU] | ORAL_TABLET | Freq: Every day | ORAL | Status: DC

## 2012-10-04 NOTE — Patient Instructions (Addendum)
Please verify when your last DEXA scan (bone density) was and let us know-- if this was greater than 3 years ago we will schedule

## 2012-10-04 NOTE — Progress Notes (Signed)
Patient ID: [name], female   DOB: 08/31/1945, 67 y.o.   MRN: [id number]   Allergies  Allergen Reactions  . Morphine And Related Itching    Chief Complaint  Patient presents with  . Follow-up    Discuss labs (copy printed)    HPI: Patient is a 67 y.o. female seen in the office today for EV and lab review  Has been off her statin- walking more and on weight watchers- has lost 25 lbs since January Feeling great. Overall has no complaints today other than having increase pain in left hip and knee over the past year and is planning to see Dr Charlann Boxer for this.  She had her Ankle replacement sept 2014 and has been doing well with this. Currently taking gabapentin twice daily and weaning herself off.  Health maintainence- Has not had mammogram this year-- currently due, dexa 2010, colonoscopy 2007 Review of Systems:  Review of Systems  Constitutional: Positive for weight loss (intentional). Negative for fever and chills.  HENT: Positive for hearing loss (ongoing- does not like hearing-aids) and congestion (uses nasonex which makes this better). Negative for ear pain, nosebleeds, sore throat and tinnitus.   Eyes:       Eye doctor yearly  Respiratory: Negative for cough and shortness of breath.   Cardiovascular: Negative for chest pain, palpitations and leg swelling.  Gastrointestinal: Negative for heartburn, abdominal pain, diarrhea and constipation.  Genitourinary: Negative for dysuria, urgency and frequency.  Musculoskeletal: Positive for joint pain (left hip and knee). Negative for myalgias and falls.  Skin: Negative.        Has yearly skin mapping schedule in April by dermatology  Neurological: Positive for tingling (weaning off gabapentin which she was taking for her neuropathy after surgery for ankle). Negative for dizziness, weakness and headaches.  Psychiatric/Behavioral: Negative for depression. The patient is not nervous/anxious and does not have insomnia.      Past  Medical History  Diagnosis Date  . Hypertension     been resolved >1 year.  . Hyperlipidemia   . Pain in joint, ankle and foot   . Contact dermatitis and other eczema, due to unspecified cause   . Other and unspecified hyperlipidemia   . Encounter for long-term (current) use of other medications   . Screening for malignant neoplasm of the cervix    Past Surgical History  Procedure Laterality Date  . 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 Ankle surgery  05/2006    Right Ankle  . Tonsillectomy      @ 67 y.o.  . Diagnostic laparoscopy  1980  . Tubal ligation  1980  . Rhinoplasty  1969  . Carpel tunnel release  [id number]    [id number]), Left(2006)  . Knee arthroplasty  05/2007    Left  . Total ankle arthroplasty  10/30/2011    Procedure: TOTAL ANKLE ARTHOPLASTY;  Surgeon: Toni Arthurs, MD;  Location: [facility] OR;  Service: Orthopedics;  Laterality: Right;  RIGHT TOTAL ANKLE REPLACEMENT, POSSIBLE GASTROC RESESSION   Social History:   reports that she quit smoking about 32 years ago. Her smoking use included Cigarettes. She has a 16 pack-year smoking history. She does not have any smokeless tobacco history on file. She reports that  drinks alcohol. She reports that she does not use illicit drugs.  Family History  Problem Relation Age of Onset  . Cancer Mother 85    breast    Medications: Patient's Medications  New Prescriptions  No medications on file  Previous Medications   ASCORBIC ACID (VITAMIN C) 1000 MG TABLET    Take 1,000 mg by mouth daily.   ASPIRIN EC 81 MG TABLET    Take 81 mg by mouth daily.   B COMPLEX VITAMINS (VITAMIN B-COMPLEX) TABS    Take by mouth once.   CALCIUM CARB-CHOLECALCIFEROL (CALCIUM 1000 + D PO)    Take by mouth daily.   GABAPENTIN (NEURONTIN) 100 MG CAPSULE    Take 100 mg by mouth 2 (two) times daily.   MULTIPLE VITAMIN (MULTIVITAMIN WITH MINERALS) TABS    Take 1 tablet by mouth daily.   TRIAMCINOLONE ACETONIDE (NASACORT ALLERGY 24HR) 55 MCG/ACT  AERO    Place into the nose daily.  Modified Medications   Modified Medication Previous Medication   CHOLECALCIFEROL (VITAMIN D) 1000 UNITS TABLET cholecalciferol (VITAMIN D) 1000 UNITS tablet      Take 1 tablet (1,000 Units total) by mouth daily.    Take 1,000 Units by mouth daily.  Discontinued Medications   ASPIRIN 325 MG TABLET    Take 1 tablet (325 mg total) by mouth daily.   DOCUSATE SODIUM 100 MG CAPS    Take 100 mg by mouth 2 (two) times daily.   OXYCODONE (ROXICODONE) 5 MG IMMEDIATE RELEASE TABLET    Take 1-2 tablets (5-10 mg total) by mouth every 4 (four) hours as needed for pain.   PHENYLEPHRINE (SUDAFED PE) 10 MG TABS TABLET    Take 10 mg by mouth every 4 (four) hours as needed.   SENNA (SENOKOT) 8.6 MG TABS    Take 1 tablet (8.6 mg total) by mouth 2 (two) times daily.   SIMVASTATIN (ZOCOR) 20 MG TABLET    Take 20 mg by mouth every evening. Take 1 tablet by mouth once daily for cholesterol.   TRAMADOL HCL 50 MG TBDP    Take 50 mg by mouth as needed.     Physical Exam:  Filed Vitals:   10/04/12 1449  BP: 122/76  Pulse: 76  Resp: 12  Height: 5' 1.5" (1.562 m)  Weight: 162 lb (73.483 kg)    Physical Exam GENERAL APPEARANCE: Alert, conversant. Appropriately groomed. No acute distress.  SKIN: No diaphoresis rash, or wounds HEAD: Normocephalic, atraumatic  EYES: Conjunctiva/lids clear. Pupils round, reactive. EOMs intact.  EARS: External exam WNL, Hearing grossly normal.  NOSE: No deformity or discharge.  MOUTH/THROAT: Lips w/o lesions. Mouth and throat normal. Tongue moist, w/o lesion.  NECK: No thyroid tenderness, enlargement or nodule  RESPIRATORY: Breathing is even, unlabored. Lung sounds are clear   CARDIOVASCULAR: Heart RRR no murmurs, rubs or gallops. No peripheral edema.  ARTERIAL: radial pulse 2+, DP pulse 1+  VENOUS: No varicosities. No venous stasis skin changes  GASTROINTESTINAL: Abdomen is soft, non-tender, not distended w/ normal bowel sounds. No mass,  hernias or organomegally GENITOURINARY: Bladder non tender, not distended  MUSCULOSKELETAL: No abnormal joints or musculature NEUROLOGIC: Oriented X3. Cranial nerves 2-12 grossly intact. Moves all extremities no tremor. Pinprick equal bilaterally to LE PSYCHIATRIC: Mood and affect appropriate to situation   Labs reviewed: Basic Metabolic Panel:  Recent Labs  40/98/11 0902 09/30/12 0822  NA 140 141  K 4.2 4.6  CL 103 101  CO2 29 28  GLUCOSE 94 85  BUN 14 19  CREATININE 0.75 0.84  CALCIUM 9.3 8.9   Liver Function Tests:  Recent Labs  09/30/12 0822  AST 20  ALT 15  ALKPHOS 94  BILITOT 0.3  PROT 6.3  No results found for this basename: LIPASE, AMYLASE,  in the last 8760 hours No results found for this basename: AMMONIA,  in the last 8760 hours CBC:  Recent Labs  10/23/11 0902 09/30/12 0822  WBC 6.7 6.2  NEUTROABS  --  3.2  HGB 14.3 13.5  HCT 42.7 40.1  MCV 88.2 88  PLT 197  --    Lipid Panel:  Recent Labs  09/30/12 0822  HDL 58  LDLCALC 124*  TRIG 77  CHOLHDL 3.4      Assessment/Plan 1. Need for TD vaccine  Tdap vaccine greater than or equal to 7yo IM given  2. neuropathy Weaning off gabapentin- twice daily until sept and then she will decrease to 1 tablet daily then she plans on stopping medication- reports no worsening of neuropathy  3. Other and unspecified hyperlipidemia Has been off statin since April- cholesterol is unchanged from last year when she was on statin. Will cont with diet and exercise modifications and follow up lipids in 1 year  4. Allergic rhinitis, cause unspecified Stable; reports symptoms are well controlled on nasacort OTC   5. Generalized osteoarthrosis, involving multiple sites pts OA is stable; will follow up with ortho due to hip pain otherwise she is doing well.  PREVENTIVE COUNSELING:  The patient was counseled regarding the appropriate use of alcohol, regular self-examination of the breasts on a monthly basis,  prevention of dental and periodontal disease, diet, regular sustained exercise for at least 30 minutes 3-4 times per week, routine screening interval for mammogram as recommended by the [employer] Cancer Society and ACOG,  and recommended schedule for GI hemoccult testing, colonoscopy, cholesterol, and diabetes screening.  Will have pt to follow up in 1 year and before if needed Will get fasting lipids, CMP and CBC in 1 year before EV

## 2012-10-05 ENCOUNTER — Ambulatory Visit: Payer: Self-pay | Admitting: Nurse Practitioner

## 2012-10-06 ENCOUNTER — Other Ambulatory Visit: Payer: Self-pay

## 2012-10-06 DIAGNOSIS — Z1231 Encounter for screening mammogram for malignant neoplasm of breast: Secondary | ICD-10-CM

## 2012-10-15 ENCOUNTER — Telehealth: Payer: Self-pay | Admitting: *Deleted

## 2012-10-15 DIAGNOSIS — M76899 Other specified enthesopathies of unspecified lower limb, excluding foot: Secondary | ICD-10-CM | POA: Diagnosis not present

## 2012-10-15 DIAGNOSIS — IMO0002 Reserved for concepts with insufficient information to code with codable children: Secondary | ICD-10-CM | POA: Diagnosis not present

## 2012-10-15 NOTE — Telephone Encounter (Signed)
09/14/2012--Bone  Density  Per Candelaria Celeste, NP---Low bone Mass (osteopenia) Continue with Calcium and Vitamin D supplement  Patient Notified

## 2012-10-20 DIAGNOSIS — M76899 Other specified enthesopathies of unspecified lower limb, excluding foot: Secondary | ICD-10-CM | POA: Diagnosis not present

## 2012-10-21 ENCOUNTER — Ambulatory Visit
Admission: RE | Admit: 2012-10-21 | Discharge: 2012-10-21 | Disposition: A | Discharge status: Home / Self Care | Payer: Medicare Other | Source: Ambulatory Visit

## 2012-10-21 DIAGNOSIS — Z1231 Encounter for screening mammogram for malignant neoplasm of breast: Secondary | ICD-10-CM | POA: Diagnosis not present

## 2012-10-25 DIAGNOSIS — M76899 Other specified enthesopathies of unspecified lower limb, excluding foot: Secondary | ICD-10-CM | POA: Diagnosis not present

## 2012-10-27 DIAGNOSIS — M76899 Other specified enthesopathies of unspecified lower limb, excluding foot: Secondary | ICD-10-CM | POA: Diagnosis not present

## 2012-11-01 DIAGNOSIS — M76899 Other specified enthesopathies of unspecified lower limb, excluding foot: Secondary | ICD-10-CM | POA: Diagnosis not present

## 2012-11-04 DIAGNOSIS — M76899 Other specified enthesopathies of unspecified lower limb, excluding foot: Secondary | ICD-10-CM | POA: Diagnosis not present

## 2012-11-10 DIAGNOSIS — M76899 Other specified enthesopathies of unspecified lower limb, excluding foot: Secondary | ICD-10-CM | POA: Diagnosis not present

## 2012-11-11 ENCOUNTER — Encounter: Payer: Self-pay | Admitting: Nurse Practitioner

## 2012-11-16 DIAGNOSIS — M76899 Other specified enthesopathies of unspecified lower limb, excluding foot: Secondary | ICD-10-CM | POA: Diagnosis not present

## 2012-11-23 ENCOUNTER — Ambulatory Visit: Payer: Medicare Other

## 2012-11-24 DIAGNOSIS — M76899 Other specified enthesopathies of unspecified lower limb, excluding foot: Secondary | ICD-10-CM | POA: Diagnosis not present

## 2012-12-02 DIAGNOSIS — L259 Unspecified contact dermatitis, unspecified cause: Secondary | ICD-10-CM | POA: Diagnosis not present

## 2012-12-02 DIAGNOSIS — L255 Unspecified contact dermatitis due to plants, except food: Secondary | ICD-10-CM | POA: Diagnosis not present

## 2012-12-16 ENCOUNTER — Other Ambulatory Visit: Payer: Self-pay

## 2012-12-17 DIAGNOSIS — L259 Unspecified contact dermatitis, unspecified cause: Secondary | ICD-10-CM | POA: Diagnosis not present

## 2012-12-20 ENCOUNTER — Ambulatory Visit: Payer: Medicare Other | Admitting: Internal Medicine

## 2013-01-05 DIAGNOSIS — S60229A Contusion of unspecified hand, initial encounter: Secondary | ICD-10-CM | POA: Diagnosis not present

## 2013-01-13 DIAGNOSIS — Z23 Encounter for immunization: Secondary | ICD-10-CM | POA: Diagnosis not present

## 2013-02-08 ENCOUNTER — Emergency Department (HOSPITAL_COMMUNITY): Payer: Medicare Other

## 2013-02-08 ENCOUNTER — Emergency Department (HOSPITAL_COMMUNITY)
Admission: EM | Admit: 2013-02-08 | Discharge: 2013-02-08 | Disposition: A | Discharge status: Home / Self Care | Payer: Medicare Other | Attending: Emergency Medicine | Admitting: Emergency Medicine

## 2013-02-08 ENCOUNTER — Encounter (HOSPITAL_COMMUNITY): Payer: Self-pay | Admitting: Emergency Medicine

## 2013-02-08 DIAGNOSIS — R112 Nausea with vomiting, unspecified: Secondary | ICD-10-CM | POA: Insufficient documentation

## 2013-02-08 DIAGNOSIS — Z79899 Other long term (current) drug therapy: Secondary | ICD-10-CM | POA: Diagnosis not present

## 2013-02-08 DIAGNOSIS — Z8739 Personal history of other diseases of the musculoskeletal system and connective tissue: Secondary | ICD-10-CM | POA: Diagnosis not present

## 2013-02-08 DIAGNOSIS — R1013 Epigastric pain: Secondary | ICD-10-CM | POA: Insufficient documentation

## 2013-02-08 DIAGNOSIS — IMO0002 Reserved for concepts with insufficient information to code with codable children: Secondary | ICD-10-CM | POA: Insufficient documentation

## 2013-02-08 DIAGNOSIS — I10 Essential (primary) hypertension: Secondary | ICD-10-CM | POA: Insufficient documentation

## 2013-02-08 DIAGNOSIS — Z7982 Long term (current) use of aspirin: Secondary | ICD-10-CM | POA: Diagnosis not present

## 2013-02-08 DIAGNOSIS — Z872 Personal history of diseases of the skin and subcutaneous tissue: Secondary | ICD-10-CM | POA: Diagnosis not present

## 2013-02-08 DIAGNOSIS — R109 Unspecified abdominal pain: Secondary | ICD-10-CM

## 2013-02-08 DIAGNOSIS — D1803 Hemangioma of intra-abdominal structures: Secondary | ICD-10-CM | POA: Diagnosis not present

## 2013-02-08 DIAGNOSIS — K7689 Other specified diseases of liver: Secondary | ICD-10-CM | POA: Diagnosis not present

## 2013-02-08 DIAGNOSIS — E785 Hyperlipidemia, unspecified: Secondary | ICD-10-CM | POA: Diagnosis not present

## 2013-02-08 DIAGNOSIS — Z87891 Personal history of nicotine dependence: Secondary | ICD-10-CM | POA: Diagnosis not present

## 2013-02-08 LAB — URINE MICROSCOPIC-ADD ON

## 2013-02-08 LAB — CBC WITH DIFFERENTIAL/PLATELET
Basophils Absolute: 0 10*3/uL (ref 0.0–0.1)
Basophils Relative: 0 % (ref 0–1)
Eosinophils Relative: 1 % (ref 0–5)
HCT: 46.3 % — ABNORMAL HIGH (ref 36.0–46.0)
Hemoglobin: 15.6 g/dL — ABNORMAL HIGH (ref 12.0–15.0)
Lymphocytes Relative: 18 % (ref 12–46)
MCHC: 33.7 g/dL (ref 30.0–36.0)
Monocytes Absolute: 0.7 10*3/uL (ref 0.1–1.0)
Neutro Abs: 4.5 10*3/uL (ref 1.7–7.7)
Platelets: 197 10*3/uL (ref 150–400)
RDW: 13.1 % (ref 11.5–15.5)
WBC: 6.4 10*3/uL (ref 4.0–10.5)

## 2013-02-08 LAB — COMPREHENSIVE METABOLIC PANEL
ALT: 20 U/L (ref 0–35)
AST: 21 U/L (ref 0–37)
Albumin: 3.6 g/dL (ref 3.5–5.2)
CO2: 26 mEq/L (ref 19–32)
Calcium: 8.5 mg/dL (ref 8.4–10.5)
Creatinine, Ser: 0.73 mg/dL (ref 0.50–1.10)
GFR calc Af Amer: >90 mL/min (ref >90)
GFR calc non Af Amer: 85 mL/min — ABNORMAL LOW (ref >90)
Glucose, Bld: 104 mg/dL — ABNORMAL HIGH (ref 70–99)
Sodium: 140 mEq/L (ref 137–147)

## 2013-02-08 LAB — URINALYSIS, ROUTINE W REFLEX MICROSCOPIC
Glucose, UA: NEGATIVE mg/dL
Nitrite: NEGATIVE
Protein, ur: 30 mg/dL — AB
Specific Gravity, Urine: 1.028 (ref 1.005–1.030)
pH: 6 (ref 5.0–8.0)

## 2013-02-08 LAB — POCT I-STAT TROPONIN I: Troponin i, poc: 0 ng/mL (ref 0.00–0.08)

## 2013-02-08 MED ORDER — SODIUM CHLORIDE 0.9 % IV BOLUS (SEPSIS)
1000.0000 mL | Freq: Once | INTRAVENOUS | Status: AC
  Administered 2013-02-08: 1000 mL via INTRAVENOUS

## 2013-02-08 MED ORDER — HYDROCODONE-ACETAMINOPHEN 5-325 MG PO TABS: 1.0000 | ORAL_TABLET | Freq: Four times a day (QID) | ORAL | Status: DC | PRN

## 2013-02-08 MED ORDER — ONDANSETRON 4 MG PO TBDP: ORAL_TABLET | ORAL | Status: DC

## 2013-02-08 MED ORDER — PANTOPRAZOLE SODIUM 40 MG IV SOLR
40.0000 mg | Freq: Once | INTRAVENOUS | Status: AC
  Administered 2013-02-08: 40 mg via INTRAVENOUS
  Filled 2013-02-08: qty 40

## 2013-02-08 MED ORDER — PANTOPRAZOLE SODIUM 20 MG PO TBEC: 20.0000 mg | DELAYED_RELEASE_TABLET | Freq: Every day | ORAL | Status: DC

## 2013-02-08 MED ORDER — HYDROMORPHONE HCL PF 1 MG/ML IJ SOLN
0.5000 mg | Freq: Once | INTRAMUSCULAR | Status: AC
  Administered 2013-02-08: 0.5 mg via INTRAVENOUS
  Filled 2013-02-08: qty 1

## 2013-02-08 MED ORDER — ONDANSETRON HCL 4 MG/2ML IJ SOLN
4.0000 mg | Freq: Once | INTRAMUSCULAR | Status: AC
  Administered 2013-02-08: 4 mg via INTRAVENOUS
  Filled 2013-02-08: qty 2

## 2013-02-08 MED ORDER — IOHEXOL 300 MG/ML  SOLN
80.0000 mL | Freq: Once | INTRAMUSCULAR | Status: AC | PRN
  Administered 2013-02-08: 80 mL via INTRAVENOUS

## 2013-02-08 MED ORDER — ONDANSETRON HCL 4 MG PO TABS: 4.0000 mg | ORAL_TABLET | Freq: Four times a day (QID) | ORAL | Status: DC

## 2013-02-08 MED ORDER — IOHEXOL 300 MG/ML  SOLN
25.0000 mL | INTRAMUSCULAR | Status: AC
  Administered 2013-02-08: 25 mL via ORAL

## 2013-02-08 NOTE — ED Notes (Signed)
Patient presents to ed c/o abd. Pain onset Sunday with 12 hours of vomiting, states she isn't vomiting any longer however c/o nausea and "waves of pain" states its epigastric and radiates into her back at times. Currently pain free,

## 2013-02-08 NOTE — ED Provider Notes (Addendum)
CSN: [id number]     Arrival date & time 02/08/13  4098 History   First MD Initiated Contact with Patient 02/08/13 [phone]     Chief Complaint  Patient presents with  . Abdominal Pain  . Emesis   (Consider location/radiation/quality/duration/timing/severity/associated sxs/prior Treatment) Patient is a 67 y.o. female presenting with abdominal pain and vomiting. The history is provided by the patient (the pt complains of abd pain and vomiting). No language interpreter was used.  Abdominal Pain Pain location:  Epigastric Pain quality: aching   Pain radiates to:  Does not radiate Pain severity:  Moderate Onset quality:  Gradual Timing:  Intermittent Associated symptoms: vomiting   Associated symptoms: no chest pain, no cough, no diarrhea, no fatigue and no hematuria   Emesis Associated symptoms: abdominal pain   Associated symptoms: no diarrhea and no headaches     Past Medical History  Diagnosis Date  . Hypertension     been resolved >1 year.  . Hyperlipidemia   . Pain in joint, ankle and foot   . Contact dermatitis and other eczema, due to unspecified cause   . Other and unspecified hyperlipidemia   . Encounter for long-term (current) use of other medications   . Screening for malignant neoplasm of the cervix    Past Surgical History  Procedure Laterality Date  . 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 Ankle surgery  05/2006    Right Ankle  . Tonsillectomy      @ 67 y.o.  . Diagnostic laparoscopy  1980  . Tubal ligation  1980  . Rhinoplasty  1969  . Carpel tunnel release  [id number]    [id number]), Left(2006)  . Knee arthroplasty  05/2007    Left  . Total ankle arthroplasty  10/30/2011    Procedure: TOTAL ANKLE ARTHOPLASTY;  Surgeon: Toni Arthurs, MD;  Location: [facility];  Service: Orthopedics;  Laterality: Right;  RIGHT TOTAL ANKLE REPLACEMENT, POSSIBLE GASTROC RESESSION   Family History  Problem Relation Age of Onset  . Cancer Mother 79    breast   History   Substance Use Topics  . Smoking status: Former Smoker -- 0.50 packs/day for 32 years    Types: Cigarettes    Quit date: 12/17/1979  . Smokeless tobacco: Not on file  . Alcohol Use: 0.0 oz/week    0 Glasses of wine per week     Comment: Rare   OB History   Grav Para Term Preterm Abortions TAB SAB Ect Mult Living                 Review of Systems  Constitutional: Negative for appetite change and fatigue.  HENT: Negative for congestion, ear discharge and sinus pressure.   Eyes: Negative for discharge.  Respiratory: Negative for cough.   Cardiovascular: Negative for chest pain.  Gastrointestinal: Positive for vomiting and abdominal pain. Negative for diarrhea.  Genitourinary: Negative for frequency and hematuria.  Musculoskeletal: Negative for back pain.  Skin: Negative for rash.  Neurological: Negative for seizures and headaches.  Psychiatric/Behavioral: Negative for hallucinations.    Allergies  Morphine and related  Home Medications   Current Outpatient Rx  Name  Route  Sig  Dispense  Refill  . Ascorbic Acid (VITAMIN C) 1000 MG tablet   Oral   Take 1,000 mg by mouth daily.         Marland Kitchen aspirin EC 81 MG tablet   Oral   Take 81 mg by mouth daily.         Marland Kitchen  B Complex Vitamins (VITAMIN B-COMPLEX) TABS   Oral   Take 1 tablet by mouth daily.          . Calcium Carb-Cholecalciferol (CALCIUM 1000 + D PO)   Oral   Take 1 tablet by mouth daily.          . cholecalciferol (VITAMIN D) 1000 UNITS tablet   Oral   Take 1 tablet (1,000 Units total) by mouth daily.   30 tablet   0   . gabapentin (NEURONTIN) 100 MG capsule   Oral   Take 100 mg by mouth daily.          . Multiple Vitamin (MULTIVITAMIN WITH MINERALS) TABS   Oral   Take 1 tablet by mouth daily.         . Triamcinolone Acetonide (NASACORT ALLERGY 24HR) 55 MCG/ACT AERO   Nasal   Place 2 sprays into the nose daily.          Marland Kitchen HYDROcodone-acetaminophen (NORCO/VICODIN) 5-325 MG per tablet    Oral   Take 1 tablet by mouth every 6 (six) hours as needed for moderate pain.   20 tablet   0   . ondansetron (ZOFRAN ODT) 4 MG disintegrating tablet      4mg  ODT q6 hours prn nausea/vomit   12 tablet   0   . pantoprazole (PROTONIX) 20 MG tablet   Oral   Take 1 tablet (20 mg total) by mouth daily.   30 tablet   0   . pantoprazole (PROTONIX) 20 MG tablet   Oral   Take 1 tablet (20 mg total) by mouth daily.   30 tablet   0    BP 150/77  Pulse 90  Temp(Src) 98.4 F (36.9 C) (Oral)  Resp 18  SpO2 99% Physical Exam  Constitutional: She is oriented to person, place, and time. She appears well-developed.  HENT:  Head: Normocephalic.  Eyes: Conjunctivae and EOM are normal. No scleral icterus.  Neck: Neck supple. No thyromegaly present.  Cardiovascular: Normal rate and regular rhythm.  Exam reveals no gallop and no friction rub.   No murmur heard. Pulmonary/Chest: No stridor. She has no wheezes. She has no rales. She exhibits no tenderness.  Abdominal: She exhibits no distension. There is tenderness. There is no rebound.  Tender epigatric  Musculoskeletal: Normal range of motion. She exhibits no edema.  Lymphadenopathy:    She has no cervical adenopathy.  Neurological: She is oriented to person, place, and time. She exhibits normal muscle tone. Coordination normal.  Skin: No rash noted. No erythema.  Psychiatric: She has a normal mood and affect. Her behavior is normal.    ED Course  Procedures (including critical care time) Labs Review Labs Reviewed  CBC WITH DIFFERENTIAL - Abnormal; Notable for the following:    RBC 5.17 (*)    Hemoglobin 15.6 (*)    HCT 46.3 (*)    All other components within normal limits  COMPREHENSIVE METABOLIC PANEL - Abnormal; Notable for the following:    Glucose, Bld 104 (*)    Total Bilirubin 0.2 (*)    GFR calc non Af Amer 85 (*)    All other components within normal limits  URINALYSIS, ROUTINE W REFLEX MICROSCOPIC - Abnormal;  Notable for the following:    APPearance CLOUDY (*)    Hgb urine dipstick SMALL (*)    Bilirubin Urine SMALL (*)    Ketones, ur >80 (*)    Protein, ur 30 (*)    Leukocytes,  UA SMALL (*)    All other components within normal limits  URINE MICROSCOPIC-ADD ON - Abnormal; Notable for the following:    Squamous Epithelial / LPF FEW (*)    Bacteria, UA FEW (*)    Crystals CA OXALATE CRYSTALS (*)    All other components within normal limits  LIPASE, BLOOD  POCT I-STAT TROPONIN I   Imaging Review US Abdomen Complete  02/08/2013   CLINICAL DATA:  Abdominal pain  EXAM: ULTRASOUND ABDOMEN COMPLETE  COMPARISON:  None.  FINDINGS: Gallbladder:  Normal without stones, sludge or wall thickening.  Common bile duct:  Diameter: 9 mm in diameter.  No visible stone.  Liver:  There are multiple cysts, the largest in the right lobe measuring 3.5 x 4.4 x 4.4 cm. There are 2 hyperechoic foci, 1 measuring 2.2 x 1.5 x 2.3 cm posteriorly in the right lobe and the other measuring 1.0 x 1.1 x 0.9 cm higher in the right lobe. There is mild intrahepatic ductal prominence. The portal vein is patent and shows normal directional flow.  IVC:  No abnormality visualized.  Pancreas:  Normal appearance of the head and body. The tail is poorly seen because of overlying bowel gas.  Spleen:  Normal at 7.5 cm.  Right Kidney:  Length: 10.4 cm in length. Echogenicity within normal limits. No mass or hydronephrosis visualized.  Left Kidney:  Length: 10.4 cm in length. Echogenicity within normal limits. No mass or hydronephrosis visualized.  Abdominal aorta:  No aneurysm visualized.  Other findings:  There is a cystic abnormality in the left upper quadrant measuring 3.7 x 4.3 by 4.7 cm. This is not well characterized because of regional bowel gas. This could possibly dry from the spleen, the pancreaticoduodenal or could be a free collection.  IMPRESSION: Multiple liver cysts. Two hyperechoic foci in the liver likely to represent hemangiomas.   3.7 x 4.3 x 4.7 cm cystic abnormality in the left upper quadrant of uncertain derivation.  Intra and extrahepatic biliary prominence without definable cause.  Because of these findings, CT scan of the abdomen with contrast to be suggested if able.   Electronically Signed   By: Paulina Fusi M.D.   On: 02/08/2013 12:29   Ct Abdomen Pelvis W Contrast  02/08/2013   CLINICAL DATA:  Pain.  Cramping.  EXAM: CT ABDOMEN AND PELVIS WITH CONTRAST  TECHNIQUE: Multidetector CT imaging of the abdomen and pelvis was performed using the standard protocol following bolus administration of intravenous contrast.  CONTRAST:  80mL OMNIPAQUE IOHEXOL 300 MG/ML  SOLN  COMPARISON:  Ultrasound 02/08/2013.  FINDINGS: 4.6 cm right hepatic simple cyst consistent with ultrasound finding. Several small hepatic cysts are present. Remaining smaller hypodensities in the liver demonstrate centripetal enhancement consistent with hemangiomas. This correlates with ultrasound findings. The gallbladder is nondistended. No definite biliary ductal distention could be demonstrated by CT but is present by ultrasound. Correlation with liver function tests suggested. If need be we can perform MRCP. Pancreas normal. Spleen is normal. A 4.6 cm maximum diameter cyst is noted in the left upper quadrant image number 13/series 4, and coronal image 37/series 7. This abuts and may arise from the left lobe of the liver. No solid component is noted. Nevertheless a follow-up CT in 6 months is suggested to demonstrate stability to exclude a low-grade cystic malignancy such as a malignancy of GI or gynecologic origin.  Adrenals normal. No significant renal abnormality. No hydronephrosis. No obstructing ureteral stone. A tiny 2-3 mm stone distal right ureter  cannot be completely excluded. This could represent a phlebolith as well.  No inguinal or retroperitoneal adenopathy. Abdominal aorta is widely patent with atherosclerotic vascular changes. Visceral vessels including  the portal vein and splenic vein patent.  Appendix difficult to visualize but appears normal. No inflammatory change in right or left lower quadrant. Stomach nondistended. Mildly distended loops of small and large bowel present this suggests possibility of mild adynamic ileus. No definite site of bowel obstruction noted. No hernia.  Mild atelectasis lung bases. Heart size normal. Degenerative changes lumbar spine. Scoliosis lumbar spine. Degenerative changes both hips.  IMPRESSION: 1. Cannot exclude tiny nonobstructing 2-3 mm distal right ureteral stone. This may just represent a tiny phlebolith. Mild distention of small large bowel noted. A mild adynamic ileus may be present .  2. Simple hepatic cysts and hemangiomas as noted on ultrasound. CT findings correlate well with ultrasound.  3. Large left upper quadrant cyst is most likely an exophytic cyst arising from the left lobe of the liver. To exclude other significant pathology including cystic malignancy a follow-up CT in 6 months suggested.  3. No definite biliary distention noted by CT. Mild biliary ductal distention does appear present by ultrasound and correlation with liver function tests suggested. If need be we can perform MRCP for further evaluation .   Electronically Signed   By: Maisie Fus  Register   On: 02/08/2013 14:42    EKG Interpretation    Date/Time:  Tuesday February 08 2013 07:39:17 EST Ventricular Rate:  109 PR Interval:  170 QRS Duration: 84 QT Interval:  334 QTC Calculation: 449 R Axis:   -66 Text Interpretation:  Sinus tachycardia Possible Left atrial enlargement Left anterior fascicular block Septal infarct , age undetermined Abnormal ECG Confirmed by Anyela Napierkowski  MD, Derrika Ruffalo ([id number]) on 02/08/2013 3:09:38 PM            MDM   1. Abdominal pain       Benny Lennert, MD 02/08/13 1610  Benny Lennert, MD 02/09/13 [phone]

## 2013-02-08 NOTE — ED Notes (Signed)
Pt c/o N/V x 2 days with upper abd pain

## 2013-02-08 NOTE — ED Notes (Signed)
Pt finished drinking PO contrast. CT made aware.  

## 2013-02-08 NOTE — ED Notes (Signed)
Patient resting on stretcher aware she is waiting on u/s. States her pain and nausea are totally gone. Only c/o periods of gas.

## 2013-02-11 ENCOUNTER — Ambulatory Visit: Payer: Self-pay | Admitting: Nurse Practitioner

## 2013-02-16 DIAGNOSIS — M7750 Other enthesopathy of unspecified foot: Secondary | ICD-10-CM | POA: Diagnosis not present

## 2013-02-17 ENCOUNTER — Ambulatory Visit (INDEPENDENT_AMBULATORY_CARE_PROVIDER_SITE_OTHER): Payer: Medicare Other | Admitting: Nurse Practitioner

## 2013-02-17 ENCOUNTER — Encounter: Payer: Self-pay | Admitting: Nurse Practitioner

## 2013-02-17 VITALS — BP 132/78 | HR 75 | Resp 10 | Wt 158.6 lb

## 2013-02-17 DIAGNOSIS — E663 Overweight: Secondary | ICD-10-CM | POA: Diagnosis not present

## 2013-02-17 DIAGNOSIS — N951 Menopausal and female climacteric states: Secondary | ICD-10-CM | POA: Diagnosis not present

## 2013-02-17 MED ORDER — ESTROGENS, CONJUGATED 0.625 MG/GM VA CREA: TOPICAL_CREAM | VAGINAL | Status: DC

## 2013-02-17 NOTE — Progress Notes (Signed)
Patient ID: [name], female   DOB: Dec 12, 1945, 68 y.o.   MRN: [id number]    Allergies  Allergen Reactions  . Morphine And Related Itching    Chief Complaint  Patient presents with  . Medication Management    Request for New RX   . Follow-up    ER follow-up (abdominal pain and nausea x 48 hours)     HPI: Patient is a 68 y.o. female seen in the office today for follow up from ER in Dec and would like premarin cream; was previously on premarin 2 years ago and was doing fine but now has started to experience dryness, itching and discomfort to the vaginal area and would like to restart the medication  -Went to ED on 02/08/13 due to abdominal pain and severe vomiting for over 12 hours; was worked up in the ED without finding anything; gave her IV fluids and medications she was prescribed helped the pain and vomiting, nausea abdominal pain had soon after resolved but she still felt bad;  in the last 2 days she has finally started to feel well again. - currently doing weight watcher; they have her goal weight at 120-130 but given her age she feels like this is may be too much weight loss; pts goal for herself is 142 and was wondering if this was okay Review of Systems:  Review of Systems  Constitutional: Negative for fever, chills and malaise/fatigue.  Respiratory: Negative for shortness of breath.   Cardiovascular: Negative for chest pain.  Gastrointestinal: Negative for nausea, vomiting, abdominal pain, diarrhea and constipation.  Genitourinary: Negative for dysuria.       Dryness with itching to vaginal area  Musculoskeletal: Negative for myalgias.  Skin: Negative.   Neurological: Negative for weakness.     Past Medical History  Diagnosis Date  . Hypertension     been resolved >1 year.  . Hyperlipidemia   . Pain in joint, ankle and foot   . Contact dermatitis and other eczema, due to unspecified cause   . Other and unspecified hyperlipidemia   . Encounter for long-term  (current) use of other medications   . Screening for malignant neoplasm of the cervix    Past Surgical History  Procedure Laterality Date  . 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 Ankle surgery  05/2006    Right Ankle  . Tonsillectomy      @ 68 y.o.  . Diagnostic laparoscopy  1980  . Tubal ligation  1980  . Rhinoplasty  1969  . Carpel tunnel release  [id number]    [id number]), Left(2006)  . Knee arthroplasty  05/2007    Left  . Total ankle arthroplasty  10/30/2011    Procedure: TOTAL ANKLE ARTHOPLASTY;  Surgeon: Wylene Simmer, MD;  Location: [facility];  Service: Orthopedics;  Laterality: Right;  RIGHT TOTAL ANKLE REPLACEMENT, POSSIBLE GASTROC RESESSION   Social History:   reports that she quit smoking about 33 years ago. Her smoking use included Cigarettes. She has a 16 pack-year smoking history. She does not have any smokeless tobacco history on file. She reports that she drinks alcohol. She reports that she does not use illicit drugs.  Family History  Problem Relation Age of Onset  . Cancer Mother 25    breast    Medications: Patient's Medications  New Prescriptions   No medications on file  Previous Medications   ASCORBIC ACID (VITAMIN C) 1000 MG TABLET    Take 1,000 mg by mouth  daily.   ASPIRIN EC 81 MG TABLET    Take 81 mg by mouth daily.   B COMPLEX VITAMINS (VITAMIN B-COMPLEX) TABS    Take 1 tablet by mouth daily.    CALCIUM CARB-CHOLECALCIFEROL (CALCIUM 1000 + D PO)    Take 1 tablet by mouth daily.    CHOLECALCIFEROL (VITAMIN D) 1000 UNITS TABLET    Take 1 tablet (1,000 Units total) by mouth daily.   GABAPENTIN (NEURONTIN) 100 MG CAPSULE    Take 100 mg by mouth daily.    HYDROCODONE-ACETAMINOPHEN (NORCO/VICODIN) 5-325 MG PER TABLET    Take 1 tablet by mouth every 6 (six) hours as needed for moderate pain.   MULTIPLE VITAMIN (MULTIVITAMIN WITH MINERALS) TABS    Take 1 tablet by mouth daily.   ONDANSETRON (ZOFRAN ODT) 4 MG DISINTEGRATING TABLET    4mg  ODT q6 hours prn  nausea/vomit   PANTOPRAZOLE (PROTONIX) 20 MG TABLET    Take 1 tablet (20 mg total) by mouth daily.   TRIAMCINOLONE ACETONIDE (NASACORT ALLERGY 24HR) 55 MCG/ACT AERO    Place 2 sprays into the nose daily.   Modified Medications   No medications on file  Discontinued Medications   PANTOPRAZOLE (PROTONIX) 20 MG TABLET    Take 1 tablet (20 mg total) by mouth daily.     Physical Exam:  Filed Vitals:   02/17/13 1022  BP: 132/78  Pulse: 75  Resp: 10  Weight: 158 lb 9.6 oz (71.94 kg)  SpO2: 99%    Physical Exam  Constitutional: She is oriented to person, place, and time and well-developed, well-nourished, and in no distress.  HENT:  Head: Normocephalic and atraumatic.  Mouth/Throat: Oropharynx is clear and moist. No oropharyngeal exudate.  Neck: Normal range of motion. Neck supple. No thyromegaly present.  Cardiovascular: Normal rate, regular rhythm and normal heart sounds.   Pulmonary/Chest: Effort normal and breath sounds normal. No respiratory distress.  Abdominal: Soft. Bowel sounds are normal.  Musculoskeletal: She exhibits no edema and no tenderness.  Lymphadenopathy:    She has no cervical adenopathy.  Neurological: She is alert and oriented to person, place, and time.  Skin: Skin is warm and dry. She is not diaphoretic.  Psychiatric: Affect normal.     Labs reviewed: Basic Metabolic Panel:  Recent Labs  09/30/12 0822 02/08/13 0753  NA 141 140  K 4.6 3.7  CL 101 102  CO2 28 26  GLUCOSE 85 104*  BUN 19 18  CREATININE 0.84 0.73  CALCIUM 8.9 8.5   Liver Function Tests:  Recent Labs  09/30/12 0822 02/08/13 0753  AST 20 21  ALT 15 20  ALKPHOS 94 76  BILITOT 0.3 0.2*  PROT 6.3 7.2  ALBUMIN  --  3.6    Recent Labs  02/08/13 0753  LIPASE 26   No results found for this basename: AMMONIA,  in the last 8760 hours CBC:  Recent Labs  09/30/12 0822 02/08/13 0753  WBC 6.2 6.4  NEUTROABS 3.2 4.5  HGB 13.5 15.6*  HCT 40.1 46.3*  MCV 88 89.6  PLT  --   197   Lipid Panel:  Recent Labs  09/30/12 0822  HDL 58  LDLCALC 124*  TRIG 77  CHOLHDL 3.4   TSH: No results found for this basename: TSH,  in the last 8760 hours A1C: No components found with this basename: A1C,    Assessment/Plan 1. Vaginal dryness, menopausal - conjugated estrogens (PREMARIN) vaginal cream; administer 0.5 g vaginally 3 x a week  Dispense: 42.5 g; Refill: 12 -may allow use OTC lubricates due to dryness   2. Overweight -pt has done very well with weight watchers and conts to lose weight -given pts age a goal weight of 142 lbs is appropriate  -rx given for her weight watchers group  3.Abominal pain -has resolved

## 2013-02-17 NOTE — Patient Instructions (Addendum)
Premarin sent to pharmacy  Goal weight is 142 lbs    [place]

## 2013-03-20 DIAGNOSIS — N390 Urinary tract infection, site not specified: Secondary | ICD-10-CM | POA: Diagnosis not present

## 2013-04-11 DIAGNOSIS — S60229A Contusion of unspecified hand, initial encounter: Secondary | ICD-10-CM | POA: Diagnosis not present

## 2013-04-11 DIAGNOSIS — S63659A Sprain of metacarpophalangeal joint of unspecified finger, initial encounter: Secondary | ICD-10-CM | POA: Diagnosis not present

## 2013-04-11 DIAGNOSIS — M79609 Pain in unspecified limb: Secondary | ICD-10-CM | POA: Diagnosis not present

## 2013-05-25 DIAGNOSIS — S63659A Sprain of metacarpophalangeal joint of unspecified finger, initial encounter: Secondary | ICD-10-CM | POA: Diagnosis not present

## 2013-06-03 DIAGNOSIS — Z85828 Personal history of other malignant neoplasm of skin: Secondary | ICD-10-CM | POA: Diagnosis not present

## 2013-06-03 DIAGNOSIS — Q828 Other specified congenital malformations of skin: Secondary | ICD-10-CM | POA: Diagnosis not present

## 2013-06-03 DIAGNOSIS — D239 Other benign neoplasm of skin, unspecified: Secondary | ICD-10-CM | POA: Diagnosis not present

## 2013-06-03 DIAGNOSIS — L821 Other seborrheic keratosis: Secondary | ICD-10-CM | POA: Diagnosis not present

## 2013-06-03 DIAGNOSIS — L819 Disorder of pigmentation, unspecified: Secondary | ICD-10-CM | POA: Diagnosis not present

## 2013-06-03 DIAGNOSIS — L723 Sebaceous cyst: Secondary | ICD-10-CM | POA: Diagnosis not present

## 2013-06-03 DIAGNOSIS — D485 Neoplasm of uncertain behavior of skin: Secondary | ICD-10-CM | POA: Diagnosis not present

## 2013-06-03 DIAGNOSIS — L820 Inflamed seborrheic keratosis: Secondary | ICD-10-CM | POA: Diagnosis not present

## 2013-06-03 DIAGNOSIS — D1801 Hemangioma of skin and subcutaneous tissue: Secondary | ICD-10-CM | POA: Diagnosis not present

## 2013-08-18 DIAGNOSIS — J301 Allergic rhinitis due to pollen: Secondary | ICD-10-CM | POA: Diagnosis not present

## 2013-08-18 DIAGNOSIS — J3089 Other allergic rhinitis: Secondary | ICD-10-CM | POA: Diagnosis not present

## 2013-08-18 DIAGNOSIS — J3081 Allergic rhinitis due to animal (cat) (dog) hair and dander: Secondary | ICD-10-CM | POA: Diagnosis not present

## 2013-08-19 DIAGNOSIS — J309 Allergic rhinitis, unspecified: Secondary | ICD-10-CM | POA: Diagnosis not present

## 2013-09-05 DIAGNOSIS — J309 Allergic rhinitis, unspecified: Secondary | ICD-10-CM | POA: Diagnosis not present

## 2013-09-07 DIAGNOSIS — J309 Allergic rhinitis, unspecified: Secondary | ICD-10-CM | POA: Diagnosis not present

## 2013-09-09 DIAGNOSIS — J309 Allergic rhinitis, unspecified: Secondary | ICD-10-CM | POA: Diagnosis not present

## 2013-09-12 DIAGNOSIS — J309 Allergic rhinitis, unspecified: Secondary | ICD-10-CM | POA: Diagnosis not present

## 2013-09-14 DIAGNOSIS — J309 Allergic rhinitis, unspecified: Secondary | ICD-10-CM | POA: Diagnosis not present

## 2013-09-16 DIAGNOSIS — J309 Allergic rhinitis, unspecified: Secondary | ICD-10-CM | POA: Diagnosis not present

## 2013-09-19 DIAGNOSIS — J309 Allergic rhinitis, unspecified: Secondary | ICD-10-CM | POA: Diagnosis not present

## 2013-09-21 DIAGNOSIS — J309 Allergic rhinitis, unspecified: Secondary | ICD-10-CM | POA: Diagnosis not present

## 2013-09-23 DIAGNOSIS — J309 Allergic rhinitis, unspecified: Secondary | ICD-10-CM | POA: Diagnosis not present

## 2013-09-26 DIAGNOSIS — J309 Allergic rhinitis, unspecified: Secondary | ICD-10-CM | POA: Diagnosis not present

## 2013-09-27 ENCOUNTER — Other Ambulatory Visit: Payer: Self-pay

## 2013-09-27 DIAGNOSIS — Z1231 Encounter for screening mammogram for malignant neoplasm of breast: Secondary | ICD-10-CM

## 2013-09-28 DIAGNOSIS — J309 Allergic rhinitis, unspecified: Secondary | ICD-10-CM | POA: Diagnosis not present

## 2013-10-03 DIAGNOSIS — J309 Allergic rhinitis, unspecified: Secondary | ICD-10-CM | POA: Diagnosis not present

## 2013-10-04 DIAGNOSIS — M949 Disorder of cartilage, unspecified: Secondary | ICD-10-CM | POA: Diagnosis not present

## 2013-10-04 DIAGNOSIS — M899 Disorder of bone, unspecified: Secondary | ICD-10-CM | POA: Diagnosis not present

## 2013-10-04 LAB — HM DEXA SCAN

## 2013-10-06 DIAGNOSIS — J309 Allergic rhinitis, unspecified: Secondary | ICD-10-CM | POA: Diagnosis not present

## 2013-10-07 ENCOUNTER — Encounter: Payer: Medicare Other | Admitting: Internal Medicine

## 2013-10-10 DIAGNOSIS — J309 Allergic rhinitis, unspecified: Secondary | ICD-10-CM | POA: Diagnosis not present

## 2013-10-11 ENCOUNTER — Encounter: Payer: Self-pay | Admitting: *Deleted

## 2013-10-14 DIAGNOSIS — M25476 Effusion, unspecified foot: Secondary | ICD-10-CM | POA: Diagnosis not present

## 2013-10-14 DIAGNOSIS — J309 Allergic rhinitis, unspecified: Secondary | ICD-10-CM | POA: Diagnosis not present

## 2013-10-14 DIAGNOSIS — Z96669 Presence of unspecified artificial ankle joint: Secondary | ICD-10-CM | POA: Diagnosis not present

## 2013-10-14 DIAGNOSIS — M25473 Effusion, unspecified ankle: Secondary | ICD-10-CM | POA: Diagnosis not present

## 2013-10-18 DIAGNOSIS — J309 Allergic rhinitis, unspecified: Secondary | ICD-10-CM | POA: Diagnosis not present

## 2013-10-20 DIAGNOSIS — J309 Allergic rhinitis, unspecified: Secondary | ICD-10-CM | POA: Diagnosis not present

## 2013-10-24 DIAGNOSIS — J309 Allergic rhinitis, unspecified: Secondary | ICD-10-CM | POA: Diagnosis not present

## 2013-10-26 ENCOUNTER — Ambulatory Visit
Admission: RE | Admit: 2013-10-26 | Discharge: 2013-10-26 | Disposition: A | Discharge status: Home / Self Care | Payer: Medicare Other | Source: Ambulatory Visit

## 2013-10-26 DIAGNOSIS — Z1231 Encounter for screening mammogram for malignant neoplasm of breast: Secondary | ICD-10-CM | POA: Diagnosis not present

## 2013-10-27 DIAGNOSIS — J309 Allergic rhinitis, unspecified: Secondary | ICD-10-CM | POA: Diagnosis not present

## 2013-10-31 DIAGNOSIS — J309 Allergic rhinitis, unspecified: Secondary | ICD-10-CM | POA: Diagnosis not present

## 2013-11-03 DIAGNOSIS — J309 Allergic rhinitis, unspecified: Secondary | ICD-10-CM | POA: Diagnosis not present

## 2013-11-10 DIAGNOSIS — J3081 Allergic rhinitis due to animal (cat) (dog) hair and dander: Secondary | ICD-10-CM | POA: Diagnosis not present

## 2013-11-10 DIAGNOSIS — J301 Allergic rhinitis due to pollen: Secondary | ICD-10-CM | POA: Diagnosis not present

## 2013-11-10 DIAGNOSIS — J3089 Other allergic rhinitis: Secondary | ICD-10-CM | POA: Diagnosis not present

## 2013-11-14 DIAGNOSIS — J3089 Other allergic rhinitis: Secondary | ICD-10-CM | POA: Diagnosis not present

## 2013-11-14 DIAGNOSIS — J3081 Allergic rhinitis due to animal (cat) (dog) hair and dander: Secondary | ICD-10-CM | POA: Diagnosis not present

## 2013-11-14 DIAGNOSIS — J301 Allergic rhinitis due to pollen: Secondary | ICD-10-CM | POA: Diagnosis not present

## 2013-11-17 DIAGNOSIS — J3089 Other allergic rhinitis: Secondary | ICD-10-CM | POA: Diagnosis not present

## 2013-11-21 DIAGNOSIS — J3089 Other allergic rhinitis: Secondary | ICD-10-CM | POA: Diagnosis not present

## 2013-11-21 DIAGNOSIS — J3081 Allergic rhinitis due to animal (cat) (dog) hair and dander: Secondary | ICD-10-CM | POA: Diagnosis not present

## 2013-11-21 DIAGNOSIS — J301 Allergic rhinitis due to pollen: Secondary | ICD-10-CM | POA: Diagnosis not present

## 2013-11-23 DIAGNOSIS — J301 Allergic rhinitis due to pollen: Secondary | ICD-10-CM | POA: Diagnosis not present

## 2013-11-23 DIAGNOSIS — J3081 Allergic rhinitis due to animal (cat) (dog) hair and dander: Secondary | ICD-10-CM | POA: Diagnosis not present

## 2013-11-24 DIAGNOSIS — J3089 Other allergic rhinitis: Secondary | ICD-10-CM | POA: Diagnosis not present

## 2013-11-28 DIAGNOSIS — J3081 Allergic rhinitis due to animal (cat) (dog) hair and dander: Secondary | ICD-10-CM | POA: Diagnosis not present

## 2013-11-28 DIAGNOSIS — J301 Allergic rhinitis due to pollen: Secondary | ICD-10-CM | POA: Diagnosis not present

## 2013-11-28 DIAGNOSIS — J3089 Other allergic rhinitis: Secondary | ICD-10-CM | POA: Diagnosis not present

## 2013-12-01 DIAGNOSIS — J3089 Other allergic rhinitis: Secondary | ICD-10-CM | POA: Diagnosis not present

## 2013-12-06 DIAGNOSIS — J3089 Other allergic rhinitis: Secondary | ICD-10-CM | POA: Diagnosis not present

## 2013-12-06 DIAGNOSIS — J301 Allergic rhinitis due to pollen: Secondary | ICD-10-CM | POA: Diagnosis not present

## 2013-12-06 DIAGNOSIS — Z23 Encounter for immunization: Secondary | ICD-10-CM | POA: Diagnosis not present

## 2013-12-06 DIAGNOSIS — J3081 Allergic rhinitis due to animal (cat) (dog) hair and dander: Secondary | ICD-10-CM | POA: Diagnosis not present

## 2013-12-12 ENCOUNTER — Encounter (HOSPITAL_COMMUNITY): Payer: Self-pay | Admitting: Emergency Medicine

## 2013-12-12 DIAGNOSIS — R110 Nausea: Secondary | ICD-10-CM | POA: Diagnosis not present

## 2013-12-12 DIAGNOSIS — Z8639 Personal history of other endocrine, nutritional and metabolic disease: Secondary | ICD-10-CM | POA: Insufficient documentation

## 2013-12-12 DIAGNOSIS — R42 Dizziness and giddiness: Secondary | ICD-10-CM | POA: Diagnosis not present

## 2013-12-12 DIAGNOSIS — Z7951 Long term (current) use of inhaled steroids: Secondary | ICD-10-CM | POA: Insufficient documentation

## 2013-12-12 DIAGNOSIS — Z79899 Other long term (current) drug therapy: Secondary | ICD-10-CM | POA: Diagnosis not present

## 2013-12-12 DIAGNOSIS — I10 Essential (primary) hypertension: Secondary | ICD-10-CM | POA: Diagnosis not present

## 2013-12-12 DIAGNOSIS — Z87891 Personal history of nicotine dependence: Secondary | ICD-10-CM | POA: Insufficient documentation

## 2013-12-12 DIAGNOSIS — Z872 Personal history of diseases of the skin and subcutaneous tissue: Secondary | ICD-10-CM | POA: Diagnosis not present

## 2013-12-12 DIAGNOSIS — Z8739 Personal history of other diseases of the musculoskeletal system and connective tissue: Secondary | ICD-10-CM | POA: Diagnosis not present

## 2013-12-12 DIAGNOSIS — Z7982 Long term (current) use of aspirin: Secondary | ICD-10-CM | POA: Insufficient documentation

## 2013-12-12 LAB — COMPREHENSIVE METABOLIC PANEL
ALT: 17 U/L (ref 0–35)
AST: 24 U/L (ref 0–37)
Albumin: 3.7 g/dL (ref 3.5–5.2)
Alkaline Phosphatase: 77 U/L (ref 39–117)
Anion gap: 13 (ref 5–15)
BUN: 22 mg/dL (ref 6–23)
CALCIUM: 8.9 mg/dL (ref 8.4–10.5)
CHLORIDE: 100 meq/L (ref 96–112)
CO2: 24 meq/L (ref 19–32)
Creatinine, Ser: 0.83 mg/dL (ref 0.50–1.10)
GFR calc Af Amer: 81 mL/min — ABNORMAL LOW (ref >90)
GFR, EST NON AFRICAN AMERICAN: 70 mL/min — AB (ref >90)
Glucose, Bld: 148 mg/dL — ABNORMAL HIGH (ref 70–99)
Potassium: 4.1 mEq/L (ref 3.7–5.3)
SODIUM: 137 meq/L (ref 137–147)
Total Bilirubin: <0.2 mg/dL — ABNORMAL LOW (ref 0.3–1.2)
Total Protein: 6.9 g/dL (ref 6.0–8.3)

## 2013-12-12 LAB — CBC WITH DIFFERENTIAL/PLATELET
BASOS ABS: 0 10*3/uL (ref 0.0–0.1)
BASOS PCT: 1 % (ref 0–1)
EOS PCT: 3 % (ref 0–5)
Eosinophils Absolute: 0.2 10*3/uL (ref 0.0–0.7)
HCT: 41.2 % (ref 36.0–46.0)
Hemoglobin: 14 g/dL (ref 12.0–15.0)
Lymphocytes Relative: 27 % (ref 12–46)
Lymphs Abs: 2 10*3/uL (ref 0.7–4.0)
MCH: 29.7 pg (ref 26.0–34.0)
MCHC: 34 g/dL (ref 30.0–36.0)
MCV: 87.3 fL (ref 78.0–100.0)
Monocytes Absolute: 0.5 10*3/uL (ref 0.1–1.0)
Monocytes Relative: 6 % (ref 3–12)
NEUTROS ABS: 4.7 10*3/uL (ref 1.7–7.7)
Neutrophils Relative %: 63 % (ref 43–77)
PLATELETS: 184 10*3/uL (ref 150–400)
RBC: 4.72 MIL/uL (ref 3.87–5.11)
RDW: 12.6 % (ref 11.5–15.5)
WBC: 7.4 10*3/uL (ref 4.0–10.5)

## 2013-12-12 NOTE — ED Notes (Signed)
Pt. reports elevated blood pressure this evening 171/106 with nausea and mild dizziness, alert and oriented , respirations unlabored , denies pain .

## 2013-12-13 ENCOUNTER — Emergency Department (HOSPITAL_COMMUNITY)
Admission: EM | Admit: 2013-12-13 | Discharge: 2013-12-13 | Disposition: A | Discharge status: Home / Self Care | Payer: Medicare Other | Attending: Emergency Medicine | Admitting: Emergency Medicine

## 2013-12-13 DIAGNOSIS — I10 Essential (primary) hypertension: Secondary | ICD-10-CM | POA: Diagnosis not present

## 2013-12-13 DIAGNOSIS — R42 Dizziness and giddiness: Secondary | ICD-10-CM

## 2013-12-13 DIAGNOSIS — J3081 Allergic rhinitis due to animal (cat) (dog) hair and dander: Secondary | ICD-10-CM | POA: Diagnosis not present

## 2013-12-13 DIAGNOSIS — J3089 Other allergic rhinitis: Secondary | ICD-10-CM | POA: Diagnosis not present

## 2013-12-13 DIAGNOSIS — J301 Allergic rhinitis due to pollen: Secondary | ICD-10-CM | POA: Diagnosis not present

## 2013-12-13 NOTE — Discharge Instructions (Signed)
Dizziness °Dizziness is a common problem. It is a feeling of unsteadiness or light-headedness. You may feel like you are about to faint. Dizziness can lead to injury if you stumble or fall. A person of any age group can suffer from dizziness, but dizziness is more common in older adults. °CAUSES  °Dizziness can be caused by many different things, including: °· Middle ear problems. °· Standing for too long. °· Infections. °· An allergic reaction. °· Aging. °· An emotional response to something, such as the sight of blood. °· Side effects of medicines. °· Tiredness. °· Problems with circulation or blood pressure. °· Excessive use of alcohol or medicines, or illegal drug use. °· Breathing too fast (hyperventilation). °· An irregular heart rhythm (arrhythmia). °· A low red blood cell count (anemia). °· Pregnancy. °· Vomiting, diarrhea, fever, or other illnesses that cause body fluid loss (dehydration). °· Diseases or conditions such as Parkinson's disease, high blood pressure (hypertension), diabetes, and thyroid problems. °· Exposure to extreme heat. °DIAGNOSIS  °Your health care provider will ask about your symptoms, perform a physical exam, and perform an electrocardiogram (ECG) to record the electrical activity of your heart. Your health care provider may also perform other heart or blood tests to determine the cause of your dizziness. These may include: °· Transthoracic echocardiogram (TTE). During echocardiography, sound waves are used to evaluate how blood flows through your heart. °· Transesophageal echocardiogram (TEE). °· Cardiac monitoring. This allows your health care provider to monitor your heart rate and rhythm in real time. °· Holter monitor. This is a portable device that records your heartbeat and can help diagnose heart arrhythmias. It allows your health care provider to track your heart activity for several days if needed. °· Stress tests by exercise or by giving medicine that makes the heart beat  faster. °TREATMENT  °Treatment of dizziness depends on the cause of your symptoms and can vary greatly. °HOME CARE INSTRUCTIONS  °· Drink enough fluids to keep your urine clear or pale yellow. This is especially important in very hot weather. In older adults, it is also important in cold weather. °· Take your medicine exactly as directed if your dizziness is caused by medicines. When taking blood pressure medicines, it is especially important to get up slowly. °· Rise slowly from chairs and steady yourself until you feel okay. °· In the morning, first sit up on the side of the bed. When you feel okay, stand slowly while holding onto something until you know your balance is fine. °· Move your legs often if you need to stand in one place for a long time. Tighten and relax your muscles in your legs while standing. °· Have someone stay with you for 1-2 days if dizziness continues to be a problem. Do this until you feel you are well enough to stay alone. Have the person call your health care provider if he or she notices changes in you that are concerning. °· Do not drive or use heavy machinery if you feel dizzy. °· Do not drink alcohol. °SEEK IMMEDIATE MEDICAL CARE IF:  °· Your dizziness or light-headedness gets worse. °· You feel nauseous or vomit. °· You have problems talking, walking, or using your arms, hands, or legs. °· You feel weak. °· You are not thinking clearly or you have trouble forming sentences. It may take a friend or family member to notice this. °· You have chest pain, abdominal pain, shortness of breath, or sweating. °· Your vision changes. °· You notice   any bleeding. °· You have side effects from medicine that seems to be getting worse rather than better. °MAKE SURE YOU:  °· Understand these instructions. °· Will watch your condition. °· Will get help right away if you are not doing well or get worse. °Document Released: 07/23/2000 Document Revised: 02/01/2013 Document Reviewed: 08/16/2010 °[employer]®  Patient Information ©2015 [employer]. This information is not intended to replace advice given to you by your health care provider. Make sure you discuss any questions you have with your health care provider. ° °Hypertension °Hypertension, commonly called high blood pressure, is when the force of blood pumping through your arteries is too strong. Your arteries are the blood vessels that carry blood from your heart throughout your body. A blood pressure reading consists of a higher number over a lower number, such as 110/72. The higher number (systolic) is the pressure inside your arteries when your heart pumps. The lower number (diastolic) is the pressure inside your arteries when your heart relaxes. Ideally you want your blood pressure below 120/80. °Hypertension forces your heart to work harder to pump blood. Your arteries may become narrow or stiff. Having hypertension puts you at risk for heart disease, stroke, and other problems.  °RISK FACTORS °Some risk factors for high blood pressure are controllable. Others are not.  °Risk factors you cannot control include:  °· Race. You may be at higher risk if you are African American. °· Age. Risk increases with age. °· Gender. Men are at higher risk than women before age 45 years. After age 65, women are at higher risk than men. °Risk factors you can control include: °· Not getting enough exercise or physical activity. °· Being overweight. °· Getting too much fat, sugar, calories, or salt in your diet. °· Drinking too much alcohol. °SIGNS AND SYMPTOMS °Hypertension does not usually cause signs or symptoms. Extremely high blood pressure (hypertensive crisis) may cause headache, anxiety, shortness of breath, and nosebleed. °DIAGNOSIS  °To check if you have hypertension, your health care provider will measure your blood pressure while you are seated, with your arm held at the level of your heart. It should be measured at least twice using the same arm. Certain  conditions can cause a difference in blood pressure between your right and left arms. A blood pressure reading that is higher than normal on one occasion does not mean that you need treatment. If one blood pressure reading is high, ask your health care provider about having it checked again. °TREATMENT  °Treating high blood pressure includes making lifestyle changes and possibly taking medicine. Living a healthy lifestyle can help lower high blood pressure. You may need to change some of your habits. °Lifestyle changes may include: °· Following the DASH diet. This diet is high in fruits, vegetables, and whole grains. It is low in salt, red meat, and added sugars. °· Getting at least 2½ hours of brisk physical activity every week. °· Losing weight if necessary. °· Not smoking. °· Limiting alcoholic beverages. °· Learning ways to reduce stress. ° If lifestyle changes are not enough to get your blood pressure under control, your health care provider may prescribe medicine. You may need to take more than one. Work closely with your health care provider to understand the risks and benefits. °HOME CARE INSTRUCTIONS °· Have your blood pressure rechecked as directed by your health care provider.   °· Take medicines only as directed by your health care provider. Follow the directions carefully. Blood pressure medicines must be taken as   prescribed. The medicine does not work as well when you skip doses. Skipping doses also puts you at risk for problems.   °· Do not smoke.   °· Monitor your blood pressure at home as directed by your health care provider.  °SEEK MEDICAL CARE IF:  °· You think you are having a reaction to medicines taken. °· You have recurrent headaches or feel dizzy. °· You have swelling in your ankles. °· You have trouble with your vision. °SEEK IMMEDIATE MEDICAL CARE IF: °· You develop a severe headache or confusion. °· You have unusual weakness, numbness, or feel faint. °· You have severe chest or abdominal  pain. °· You vomit repeatedly. °· You have trouble breathing. °MAKE SURE YOU:  °· Understand these instructions. °· Will watch your condition. °· Will get help right away if you are not doing well or get worse. °Document Released: 01/27/2005 Document Revised: 06/13/2013 Document Reviewed: 11/19/2012 °Exit[employer]® Patient Information ©2015 [employer]. This information is not intended to replace advice given to you by your health care provider. Make sure you discuss any questions you have with your health care provider. ° °

## 2013-12-13 NOTE — ED Provider Notes (Signed)
CSN: [id number]     Arrival date & time 12/12/13  2237 History   First MD Initiated Contact with Patient 12/13/13 0220     Chief Complaint  Patient presents with  . Hypertension     (Consider location/radiation/quality/duration/timing/severity/associated sxs/prior Treatment) HPI  68 year old female presents for evaluation of acute hypertension. She states normally her blood pressure is 518 systolic. She has been off of antihypertensives for over 2 years after diet and exercise. She states that she was bending over to pick something up and she felt acute dizziness like she was off balance and going to fall. When she stood back up she felt improved but not quite resolved. She went and rested. Overall the symptom lasts approximately 20 minutes. Patient took her blood pressure right after this and noticed it was 170/100. Patient denies any headache, chest pain, or shortness of breath. She has felt nauseous since this started but that is improving and nearly resolved. Patient currently feels well and denies any dizziness or weakness or numbness.  Past Medical History  Diagnosis Date  . Hypertension     been resolved >1 year.  . Hyperlipidemia   . Pain in joint, ankle and foot   . Contact dermatitis and other eczema, due to unspecified cause   . Other and unspecified hyperlipidemia   . Encounter for long-term (current) use of other medications   . Screening for malignant neoplasm of the cervix    Past Surgical History  Procedure Laterality Date  . 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 Ankle surgery  05/2006    Right Ankle  . Tonsillectomy      @ 68 y.o.  . Diagnostic laparoscopy  1980  . Tubal ligation  1980  . Rhinoplasty  1969  . Carpel tunnel release  [id number]    [id number]), Left(2006)  . Knee arthroplasty  05/2007    Left  . 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 Cancer Mother 5    breast   History  Substance Use Topics  . Smoking status: Former Smoker -- 0.50 packs/day for 32 years    Types: Cigarettes    Quit date: 12/17/1979  . Smokeless tobacco: Not on file  . Alcohol Use: 0.0 oz/week    0 Glasses of wine per week     Comment: Rare   OB History    No data available     Review of Systems  Respiratory: Negative for shortness of breath.   Cardiovascular: Negative for chest pain.  Gastrointestinal: Positive for nausea. Negative for vomiting and abdominal pain.  Neurological: Positive for dizziness. Negative for syncope, weakness and numbness.  All other systems reviewed and are negative.     Allergies  Morphine and related  Home Medications   Prior to Admission medications   Medication Sig Start Date End Date Taking? Authorizing Provider  Ascorbic Acid (VITAMIN C) 1000 MG tablet Take 1,000 mg by mouth daily.   Yes Historical Provider, MD  aspirin EC 81 MG tablet Take 81 mg by mouth daily.   Yes Historical Provider, MD  B Complex Vitamins (VITAMIN B-COMPLEX) TABS Take 1 tablet by mouth daily.    Yes Historical Provider, MD  Calcium Carb-Cholecalciferol (CALCIUM 1000 + D PO) Take 1  tablet by mouth daily.    Yes Historical Provider, MD  cholecalciferol (VITAMIN D) 1000 UNITS tablet Take 1 tablet (1,000 Units total) by mouth daily. 10/04/12  Yes Lauree Chandler, NP  desloratadine (CLARINEX) 5 MG tablet Take 5 mg by mouth daily.   Yes Historical Provider, MD  fluticasone (FLONASE) 50 MCG/ACT nasal spray Place 1 spray into both nostrils daily.   Yes Historical Provider, MD  gabapentin (NEURONTIN) 100 MG capsule Take 100 mg by mouth daily.    Yes Historical Provider, MD  HYDROcodone-acetaminophen (NORCO/VICODIN) 5-325 MG per tablet Take 1 tablet by mouth every 6 (six) hours as needed for moderate pain.  02/08/13  Yes Maudry Diego, MD  montelukast (SINGULAIR) 10 MG tablet Take 10 mg by mouth at bedtime.   Yes Historical Provider, MD  Multiple Vitamin (MULTIVITAMIN WITH MINERALS) TABS Take 1 tablet by mouth daily.   Yes Historical Provider, MD  ondansetron (ZOFRAN ODT) 4 MG disintegrating tablet 4mg  ODT q6 hours prn nausea/vomit 02/08/13  Yes Maudry Diego, MD  conjugated estrogens (PREMARIN) vaginal cream administer 0.5 g vaginally 3 x a week 02/17/13   Lauree Chandler, NP  pantoprazole (PROTONIX) 20 MG tablet Take 1 tablet (20 mg total) by mouth daily. 02/08/13   Maudry Diego, MD  Triamcinolone Acetonide (NASACORT ALLERGY 24HR) 55 MCG/ACT AERO Place 2 sprays into the nose daily.     Historical Provider, MD   BP 159/88 mmHg  Pulse 82  Temp(Src) 98.4 F (36.9 C) (Oral)  Resp 19  Ht 5\' 1"  (1.549 m)  Wt 155 lb (70.308 kg)  BMI 29.30 kg/m2  SpO2 97% Physical Exam  Constitutional: She is oriented to person, place, and time. She appears well-developed and well-nourished.  HENT:  Head: Normocephalic and atraumatic.  Right Ear: External ear normal.  Left Ear: External ear normal.  Nose: Nose normal.  Eyes: EOM are normal. Pupils are equal, round, and reactive to light. Right eye exhibits no discharge. Left eye exhibits no discharge.  No nystagmus  Cardiovascular: Normal rate, regular rhythm and normal heart sounds.   Pulmonary/Chest: Effort normal and breath sounds normal.  Abdominal: Soft. She exhibits no distension. There is no tenderness.  Neurological: She is alert and oriented to person, place, and time.  CN 2-12 grossly intact. 5/5 strength in all 4 extremities. Normal finger to nose, heel to shin, and normal gait.  Skin: Skin is warm and dry.  Nursing note and vitals reviewed.   ED Course  Procedures (including critical care time) Labs Review Labs Reviewed  COMPREHENSIVE METABOLIC PANEL - Abnormal; Notable for the following:    Glucose, Bld 148 (*)    Total Bilirubin  <0.2 (*)    GFR calc non Af Amer 70 (*)    GFR calc Af Amer 81 (*)    All other components within normal limits  CBC WITH DIFFERENTIAL    Imaging Review No results found.   EKG Interpretation   Date/Time:  Tuesday December 13 2013 02:55:15 EST Ventricular Rate:  70 PR Interval:  199 QRS Duration: 88 QT Interval:  399 QTC Calculation: 430 R Axis:   -36 Text Interpretation:  Sinus rhythm Probable left atrial enlargement Left  axis deviation Abnormal R-wave progression, late transition No significant  change since last tracing Confirmed by Lowell ([id number]) on  12/13/2013 3:24:14 AM      MDM   Final diagnoses:  Dizziness  Essential hypertension    Patient is currently asymptomatic and  well appearing. Initially hypertensive however this trended down without intervention. She did have an episode of vertigo-like sensation but given that it only occurred once with bending down a feels is more likely a peripheral cause. Low suspicion that this is a stroke. Given that her hypertension has trended down back towards her normal range, I feel she can be discharged with close outpatient follow-up. She does feel she can follow-up within the next 1-2 days with her PCP and prefers to wait until then for possibly restarting antihypertensives. Discussed strict return precautions.    Ephraim Hamburger, MD 12/13/13 [phone]

## 2013-12-14 ENCOUNTER — Ambulatory Visit (INDEPENDENT_AMBULATORY_CARE_PROVIDER_SITE_OTHER): Payer: Medicare Other | Admitting: Internal Medicine

## 2013-12-14 ENCOUNTER — Encounter: Payer: Self-pay | Admitting: Internal Medicine

## 2013-12-14 VITALS — BP 140/80 | HR 95 | Temp 98.1°F | Ht 61.0 in | Wt 160.8 lb

## 2013-12-14 DIAGNOSIS — M8580 Other specified disorders of bone density and structure, unspecified site: Secondary | ICD-10-CM | POA: Diagnosis not present

## 2013-12-14 DIAGNOSIS — I10 Essential (primary) hypertension: Secondary | ICD-10-CM | POA: Insufficient documentation

## 2013-12-14 MED ORDER — AMLODIPINE BESYLATE 5 MG PO TABS: 5.0000 mg | ORAL_TABLET | Freq: Every day | ORAL | Status: DC

## 2013-12-14 NOTE — Progress Notes (Signed)
Patient ID: [name], female   DOB: [birth year], 68 y.o.   MRN: [id number]    Chief Complaint  Patient presents with  . Hospitalization Follow-up    Issues with BP   Allergies  Allergen Reactions  . Morphine And Related Itching   HPI 68 yo female pt here for ED follow up. She was in the ED with elevated bp readings This Sunday she felt weak while standing at the church service and had to sit down.  on monday she felt weak while bending down, was feeling queasy, bp on check 177/107 and repeat remain elevated. Following this she went to the ED.she had extensive workup and bp in ED normalized. She was then discharged home. She had bp checked yesterday and DBP was 97. Today in office bp 140/80 She would like to know her dexa scan result  ROS Denies chest pain or dyspnea Denies nausea or vomiting Denies abdominal pain Denies focal weakness, headache or dizziness Denies change of vision  Past Medical History  Diagnosis Date  . Hypertension     been resolved >1 year.  . Hyperlipidemia   . Pain in joint, ankle and foot   . Contact dermatitis and other eczema, due to unspecified cause   . Other and unspecified hyperlipidemia   . Encounter for long-term (current) use of other medications   . Screening for malignant neoplasm of the cervix    Current Outpatient Prescriptions on File Prior to Visit  Medication Sig Dispense Refill  . Ascorbic Acid (VITAMIN C) 1000 MG tablet Take 1,000 mg by mouth daily.    Marland Kitchen aspirin EC 81 MG tablet Take 81 mg by mouth daily.    . B Complex Vitamins (VITAMIN B-COMPLEX) TABS Take 1 tablet by mouth daily.     . Calcium Carb-Cholecalciferol (CALCIUM 1000 + D PO) Take 1 tablet by mouth daily.     . cholecalciferol (VITAMIN D) 1000 UNITS tablet Take 1 tablet (1,000 Units total) by mouth daily. 30 tablet 0  . conjugated estrogens (PREMARIN) vaginal cream administer 0.5 g vaginally 3 x a week 42.5 g 12  . desloratadine (CLARINEX) 5 MG tablet Take 5 mg  by mouth daily.    . fluticasone (FLONASE) 50 MCG/ACT nasal spray Place 1 spray into both nostrils daily.    Marland Kitchen gabapentin (NEURONTIN) 100 MG capsule Take 100 mg by mouth daily.     . montelukast (SINGULAIR) 10 MG tablet Take 10 mg by mouth at bedtime.    . Multiple Vitamin (MULTIVITAMIN WITH MINERALS) TABS Take 1 tablet by mouth daily.     No current facility-administered medications on file prior to visit.   Physical exam BP 140/80 mmHg  Pulse 95  Temp(Src) 98.1 F (36.7 C) (Oral)  Ht 5\' 1"  (1.549 m)  Wt 160 lb 12.8 oz (72.938 kg)  BMI 30.40 kg/m2  SpO2 99%  General- elderly female in no acute distress Head- atraumatic, normocephalic Eyes- PERRLA, EOMI, no pallor, no icterus Neck- no lymphadenopathy, no thyromegaly, no jugular vein distension, no carotid bruit Chest- no chest wall deformities, no chest wall tenderness Cardiovascular- normal s1,s2, no murmurs/ rubs/ gallops Respiratory- bilateral clear to auscultation, no wheeze, no rhonchi, no crackles Abdomen- bowel sounds present, soft, non tender, no organomegaly, no abdominal bruits, no guarding or rigidity, no CVA tenderness Musculoskeletal- able to move all 4 extremities, no spinal and paraspinal tenderness, steady gait, no use of assistive device Neurological- no focal deficit, normal reflexes, normal muscle strength, normal sensation to fine  touch and vibration Psychiatry- alert and oriented to person, place and time, normal mood and affect  Labs CBC    Component Value Date/Time   WBC 7.4 12/12/2013 2304   WBC 6.2 09/30/2012 0822   RBC 4.72 12/12/2013 2304   RBC 4.56 09/30/2012 0822   HGB 14.0 12/12/2013 2304   HCT 41.2 12/12/2013 2304   PLT 184 12/12/2013 2304   MCV 87.3 12/12/2013 2304   MCH 29.7 12/12/2013 2304   MCH 29.6 09/30/2012 0822   MCHC 34.0 12/12/2013 2304   MCHC 33.7 09/30/2012 0822   RDW 12.6 12/12/2013 2304   RDW 13.4 09/30/2012 0822   LYMPHSABS 2.0 12/12/2013 2304   LYMPHSABS 2.4 09/30/2012 0822    MONOABS 0.5 12/12/2013 2304   EOSABS 0.2 12/12/2013 2304   EOSABS 0.2 09/30/2012 0822   BASOSABS 0.0 12/12/2013 2304   BASOSABS 0.0 09/30/2012 0822    CMP     Component Value Date/Time   NA 137 12/12/2013 2304   NA 141 09/30/2012 0822   K 4.1 12/12/2013 2304   CL 100 12/12/2013 2304   CO2 24 12/12/2013 2304   GLUCOSE 148* 12/12/2013 2304   GLUCOSE 85 09/30/2012 0822   BUN 22 12/12/2013 2304   BUN 19 09/30/2012 0822   CREATININE 0.83 12/12/2013 2304   CALCIUM 8.9 12/12/2013 2304   PROT 6.9 12/12/2013 2304   PROT 6.3 09/30/2012 0822   ALBUMIN 3.7 12/12/2013 2304   AST 24 12/12/2013 2304   ALT 17 12/12/2013 2304   ALKPHOS 77 12/12/2013 2304   BILITOT <0.2* 12/12/2013 2304   GFRNONAA 70* 12/12/2013 2304   GFRAA 81* 12/12/2013 2304    Assessment/plan  HTN A couple of elevated bp readings. Advised pt to check bp bid at home for now and if > 3 readings are > 140/90, start amlodipine 5 mg daily, script provided. Warning signs with elevated bp reading explained  Low bone mass Reviewed t score -1.3 compared to -1.1 two years back. On oscal and is doing weight bearing exercise. Has hx of knee and ankle replacement. Advised bisphosphonates/ SERM, pt to talk about this with her PCP on next visit

## 2013-12-20 DIAGNOSIS — J301 Allergic rhinitis due to pollen: Secondary | ICD-10-CM | POA: Diagnosis not present

## 2013-12-20 DIAGNOSIS — J3089 Other allergic rhinitis: Secondary | ICD-10-CM | POA: Diagnosis not present

## 2013-12-20 DIAGNOSIS — J3081 Allergic rhinitis due to animal (cat) (dog) hair and dander: Secondary | ICD-10-CM | POA: Diagnosis not present

## 2013-12-22 ENCOUNTER — Ambulatory Visit: Payer: Medicare Other | Admitting: Nurse Practitioner

## 2013-12-22 DIAGNOSIS — J3089 Other allergic rhinitis: Secondary | ICD-10-CM | POA: Diagnosis not present

## 2013-12-22 DIAGNOSIS — J3081 Allergic rhinitis due to animal (cat) (dog) hair and dander: Secondary | ICD-10-CM | POA: Diagnosis not present

## 2013-12-22 DIAGNOSIS — J301 Allergic rhinitis due to pollen: Secondary | ICD-10-CM | POA: Diagnosis not present

## 2013-12-27 DIAGNOSIS — J3081 Allergic rhinitis due to animal (cat) (dog) hair and dander: Secondary | ICD-10-CM | POA: Diagnosis not present

## 2013-12-27 DIAGNOSIS — J301 Allergic rhinitis due to pollen: Secondary | ICD-10-CM | POA: Diagnosis not present

## 2013-12-27 DIAGNOSIS — J3089 Other allergic rhinitis: Secondary | ICD-10-CM | POA: Diagnosis not present

## 2014-01-03 DIAGNOSIS — J3089 Other allergic rhinitis: Secondary | ICD-10-CM | POA: Diagnosis not present

## 2014-01-03 DIAGNOSIS — J3081 Allergic rhinitis due to animal (cat) (dog) hair and dander: Secondary | ICD-10-CM | POA: Diagnosis not present

## 2014-01-03 DIAGNOSIS — J301 Allergic rhinitis due to pollen: Secondary | ICD-10-CM | POA: Diagnosis not present

## 2014-01-10 DIAGNOSIS — J301 Allergic rhinitis due to pollen: Secondary | ICD-10-CM | POA: Diagnosis not present

## 2014-01-10 DIAGNOSIS — J3081 Allergic rhinitis due to animal (cat) (dog) hair and dander: Secondary | ICD-10-CM | POA: Diagnosis not present

## 2014-01-10 DIAGNOSIS — J3089 Other allergic rhinitis: Secondary | ICD-10-CM | POA: Diagnosis not present

## 2014-01-17 DIAGNOSIS — J3081 Allergic rhinitis due to animal (cat) (dog) hair and dander: Secondary | ICD-10-CM | POA: Diagnosis not present

## 2014-01-17 DIAGNOSIS — J3089 Other allergic rhinitis: Secondary | ICD-10-CM | POA: Diagnosis not present

## 2014-01-17 DIAGNOSIS — J301 Allergic rhinitis due to pollen: Secondary | ICD-10-CM | POA: Diagnosis not present

## 2014-01-19 ENCOUNTER — Ambulatory Visit (INDEPENDENT_AMBULATORY_CARE_PROVIDER_SITE_OTHER): Payer: Medicare Other | Admitting: Nurse Practitioner

## 2014-01-19 ENCOUNTER — Encounter: Payer: Self-pay | Admitting: Nurse Practitioner

## 2014-01-19 VITALS — BP 122/78 | HR 76 | Temp 97.1°F | Resp 10 | Ht 61.0 in | Wt 163.2 lb

## 2014-01-19 DIAGNOSIS — J309 Allergic rhinitis, unspecified: Secondary | ICD-10-CM

## 2014-01-19 DIAGNOSIS — M159 Polyosteoarthritis, unspecified: Secondary | ICD-10-CM

## 2014-01-19 DIAGNOSIS — I10 Essential (primary) hypertension: Secondary | ICD-10-CM | POA: Diagnosis not present

## 2014-01-19 DIAGNOSIS — M8580 Other specified disorders of bone density and structure, unspecified site: Secondary | ICD-10-CM

## 2014-01-19 NOTE — Progress Notes (Signed)
Patient ID: [name], female   DOB: [birth year], 68 y.o.   MRN: [id number]    PCP: Lauree Chandler, NP  Allergies  Allergen Reactions  . Morphine And Related Itching    Chief Complaint  Patient presents with  . Follow-up    6 week follow-up, never started Norvasc- B/P readings are good 129/82 (this am)      HPI: Patient is a 68 y.o. female seen in the office today to follow up blood pressure. Pt was seen by Dr Bubba Camp after ED visit for being dizzy nauseous and hypertensive. Was prescribed norvasc but never started it. Blood pressure has been at goal since. consistently <140/90. Been feeling well.  Had ankle surgery and it took 18 months before she was back to baseline. Walking 5 miles a day.   Was previously on fosamax but did not like taking this. Would like to consider another medication.   Getting allergy shots weekly by allergist- was previously taking benadryl and sudafed but has stopped    Review of Systems:  Review of Systems  Constitutional: Negative for activity change, appetite change, fatigue and unexpected weight change.  HENT: Negative for congestion and hearing loss.   Eyes: Negative.   Respiratory: Negative for cough and shortness of breath.   Cardiovascular: Negative for chest pain, palpitations and leg swelling.  Gastrointestinal: Negative for abdominal pain, diarrhea and constipation.  Genitourinary: Negative for dysuria and difficulty urinating.  Musculoskeletal: Negative for myalgias and arthralgias.  Skin: Negative for color change and wound.  Allergic/Immunologic: Positive for environmental allergies.  Neurological: Negative for dizziness and weakness.  Psychiatric/Behavioral: Negative for behavioral problems, confusion and agitation.    Past Medical History  Diagnosis Date  . Hypertension     been resolved >1 year.  . Hyperlipidemia   . Pain in joint, ankle and foot   . Contact dermatitis and other eczema, due to unspecified cause   .  Other and unspecified hyperlipidemia   . Encounter for long-term (current) use of other medications   . Screening for malignant neoplasm of the cervix    Past Surgical History  Procedure Laterality Date  . 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 Ankle surgery  05/2006    Right Ankle  . Tonsillectomy      @ 68 y.o.  . Diagnostic laparoscopy  1980  . Tubal ligation  1980  . Rhinoplasty  1969  . Carpel tunnel release  [id number]    [id number]), Left(2006)  . Knee arthroplasty  05/2007    Left  . Total ankle arthroplasty  10/30/2011    Procedure: TOTAL ANKLE ARTHOPLASTY;  Surgeon: Wylene Simmer, MD;  Location: [place];  Service: Orthopedics;  Laterality: Right;  RIGHT TOTAL ANKLE REPLACEMENT, POSSIBLE GASTROC RESESSION   Social History:   reports that she quit smoking about 34 years ago. Her smoking use included Cigarettes. She has a 16 pack-year smoking history. She does not have any smokeless tobacco history on file. She reports that she drinks alcohol. She reports that she does not use illicit drugs.  Family History  Problem Relation Age of Onset  . Cancer Mother 39    breast    Medications: Patient's Medications  New Prescriptions   No medications on file  Previous Medications   AMLODIPINE (NORVASC) 5 MG TABLET    Take 1 tablet (5 mg total) by mouth daily.   ASCORBIC ACID (VITAMIN C) 1000 MG TABLET    Take 1,000 mg by mouth  daily.   ASPIRIN EC 81 MG TABLET    Take 81 mg by mouth daily.   B COMPLEX VITAMINS (VITAMIN B-COMPLEX) TABS    Take 1 tablet by mouth daily.    CALCIUM CARB-CHOLECALCIFEROL (CALCIUM 1000 + D PO)    Take 1 tablet by mouth daily.    CHOLECALCIFEROL (VITAMIN D) 1000 UNITS TABLET    Take 1 tablet (1,000 Units total) by mouth daily.   CONJUGATED ESTROGENS (PREMARIN) VAGINAL CREAM    administer 0.5 g vaginally 3 x a week   DESLORATADINE (CLARINEX) 5 MG TABLET    Take 5 mg by mouth daily.   FLUTICASONE (FLONASE) 50 MCG/ACT NASAL SPRAY    Place 1 spray into  both nostrils daily.   GABAPENTIN (NEURONTIN) 100 MG CAPSULE    Take 100 mg by mouth daily.    MONTELUKAST (SINGULAIR) 10 MG TABLET    Take 10 mg by mouth at bedtime.   MULTIPLE VITAMIN (MULTIVITAMIN WITH MINERALS) TABS    Take 1 tablet by mouth daily.  Modified Medications   No medications on file  Discontinued Medications   No medications on file     Physical Exam:  Filed Vitals:   01/19/14 1415  BP: 122/78  Pulse: 76  Temp: 97.1 F (36.2 C)  TempSrc: Oral  Resp: 10  Height: 5\' 1"  (1.549 m)  Weight: 163 lb 3.2 oz (74.027 kg)  SpO2: 98%    Physical Exam  Constitutional: She is oriented to person, place, and time. She appears well-developed and well-nourished. No distress.  HENT:  Head: Normocephalic and atraumatic.  Mouth/Throat: Oropharynx is clear and moist. No oropharyngeal exudate.  Eyes: Conjunctivae are normal. Pupils are equal, round, and reactive to light.  Neck: Normal range of motion. Neck supple.  Cardiovascular: Normal rate, regular rhythm and normal heart sounds.   Pulmonary/Chest: Effort normal and breath sounds normal.  Abdominal: Soft. Bowel sounds are normal.  Musculoskeletal: She exhibits no edema or tenderness.  Neurological: She is alert and oriented to person, place, and time.  Skin: Skin is warm and dry. She is not diaphoretic.  Psychiatric: She has a normal mood and affect.    Labs reviewed: Basic Metabolic Panel:  Recent Labs  02/08/13 0753 12/12/13 2304  NA 140 137  K 3.7 4.1  CL 102 100  CO2 26 24  GLUCOSE 104* 148*  BUN 18 22  CREATININE 0.73 0.83  CALCIUM 8.5 8.9   Liver Function Tests:  Recent Labs  02/08/13 0753 12/12/13 2304  AST 21 24  ALT 20 17  ALKPHOS 76 77  BILITOT 0.2* <0.2*  PROT 7.2 6.9  ALBUMIN 3.6 3.7    Recent Labs  02/08/13 0753  LIPASE 26   No results for input(s): AMMONIA in the last 8760 hours. CBC:  Recent Labs  02/08/13 0753 12/12/13 2304  WBC 6.4 7.4  NEUTROABS 4.5 4.7  HGB 15.6*  14.0  HCT 46.3* 41.2  MCV 89.6 87.3  PLT 197 184   Lipid Panel: No results for input(s): CHOL, HDL, LDLCALC, TRIG, CHOLHDL, LDLDIRECT in the last 8760 hours. TSH: No results for input(s): TSH in the last 8760 hours. A1C: No results found for: HGBA1C   Assessment/Plan  1. Osteopenia -discussed proper intake of vit d and calcium, cont weight bearing exercises   2. Essential hypertension -conts to be at goal without medications. To cont lifestyle modifications -will cont to monitor  3. Generalized osteoarthrosis, involving multiple sites -stable at this time  4. Allergic  rhinitis, unspecified allergic rhinitis type -conts to follow up with allergy clinic

## 2014-01-19 NOTE — Patient Instructions (Signed)
Follow up in 6 months  Osteoporosis Throughout your life, your body breaks down old bone and replaces it with new bone. As you get older, your body does not replace bone as quickly as it breaks it down. By the age of 74 years, most people begin to gradually lose bone because of the imbalance between bone loss and replacement. Some people lose more bone than others. Bone loss beyond a specified normal degree is considered osteoporosis.  Osteoporosis affects the strength and durability of your bones. The inside of the ends of your bones and your flat bones, like the bones of your pelvis, look like honeycomb, filled with tiny open spaces. As bone loss occurs, your bones become less dense. This means that the open spaces inside your bones become bigger and the walls between these spaces become thinner. This makes your bones weaker. Bones of a person with osteoporosis can become so weak that they can break (fracture) during minor accidents, such as a simple fall. CAUSES  The following factors have been associated with the development of osteoporosis:  Smoking.  Drinking more than 2 alcoholic drinks several days per week.  Long-term use of certain medicines:  Corticosteroids.  Chemotherapy medicines.  Thyroid medicines.  Antiepileptic medicines.  Gonadal hormone suppression medicine.  Immunosuppression medicine.  Being underweight.  Lack of physical activity.  Lack of exposure to the sun. This can lead to vitamin D deficiency.  Certain medical conditions:  Certain inflammatory bowel diseases, such as Crohn disease and ulcerative colitis.  Diabetes.  Hyperthyroidism.  Hyperparathyroidism. RISK FACTORS Anyone can develop osteoporosis. However, the following factors can increase your risk of developing osteoporosis:  Gender--Women are at higher risk than men.  Age--Being older than 50 years increases your risk.  Ethnicity--White and Asian people have an increased  risk.  Weight --Being extremely underweight can increase your risk of osteoporosis.  Family history of osteoporosis--Having a family member who has developed osteoporosis can increase your risk. SYMPTOMS  Usually, people with osteoporosis have no symptoms.  DIAGNOSIS  Signs during a physical exam that may prompt your caregiver to suspect osteoporosis include:  Decreased height. This is usually caused by the compression of the bones that form your spine (vertebrae) because they have weakened and become fractured.  A curving or rounding of the upper back (kyphosis). To confirm signs of osteoporosis, your caregiver may request a procedure that uses 2 low-dose X-ray beams with different levels of energy to measure your bone mineral density (dual-energy X-ray absorptiometry [DXA]). Also, your caregiver may check your level of vitamin D. TREATMENT  The goal of osteoporosis treatment is to strengthen bones in order to decrease the risk of bone fractures. There are different types of medicines available to help achieve this goal. Some of these medicines work by slowing the processes of bone loss. Some medicines work by increasing bone density. Treatment also involves making sure that your levels of calcium and vitamin D are adequate. PREVENTION  There are things you can do to help prevent osteoporosis. Adequate intake of calcium and vitamin D can help you achieve optimal bone mineral density. Regular exercise can also help, especially resistance and weight-bearing activities. If you smoke, quitting smoking is an important part of osteoporosis prevention. MAKE SURE YOU:  Understand these instructions.  Will watch your condition.  Will get help right away if you are not doing well or get worse. FOR MORE INFORMATION www.osteo.org and Equipment[employer].com.ee Document Released: 11/06/2004 Document Revised: 05/24/2012 Document Reviewed: 01/11/2011 ExitCare Patient Information 2015  [employer]. This information  is not intended to replace advice given to you by your health care provider. Make sure you discuss any questions you have with your health care provider.

## 2014-01-23 ENCOUNTER — Other Ambulatory Visit: Payer: Medicare Other

## 2014-01-24 DIAGNOSIS — J301 Allergic rhinitis due to pollen: Secondary | ICD-10-CM | POA: Diagnosis not present

## 2014-01-24 DIAGNOSIS — J3089 Other allergic rhinitis: Secondary | ICD-10-CM | POA: Diagnosis not present

## 2014-01-24 DIAGNOSIS — J3081 Allergic rhinitis due to animal (cat) (dog) hair and dander: Secondary | ICD-10-CM | POA: Diagnosis not present

## 2014-01-26 ENCOUNTER — Encounter: Payer: Medicare Other | Admitting: Internal Medicine

## 2014-02-01 DIAGNOSIS — J3081 Allergic rhinitis due to animal (cat) (dog) hair and dander: Secondary | ICD-10-CM | POA: Diagnosis not present

## 2014-02-01 DIAGNOSIS — J301 Allergic rhinitis due to pollen: Secondary | ICD-10-CM | POA: Diagnosis not present

## 2014-02-01 DIAGNOSIS — J3089 Other allergic rhinitis: Secondary | ICD-10-CM | POA: Diagnosis not present

## 2014-02-07 DIAGNOSIS — J3089 Other allergic rhinitis: Secondary | ICD-10-CM | POA: Diagnosis not present

## 2014-02-07 DIAGNOSIS — J301 Allergic rhinitis due to pollen: Secondary | ICD-10-CM | POA: Diagnosis not present

## 2014-02-07 DIAGNOSIS — J3081 Allergic rhinitis due to animal (cat) (dog) hair and dander: Secondary | ICD-10-CM | POA: Diagnosis not present

## 2014-02-14 DIAGNOSIS — J3081 Allergic rhinitis due to animal (cat) (dog) hair and dander: Secondary | ICD-10-CM | POA: Diagnosis not present

## 2014-02-14 DIAGNOSIS — J301 Allergic rhinitis due to pollen: Secondary | ICD-10-CM | POA: Diagnosis not present

## 2014-02-14 DIAGNOSIS — J3089 Other allergic rhinitis: Secondary | ICD-10-CM | POA: Diagnosis not present

## 2014-02-21 DIAGNOSIS — J3089 Other allergic rhinitis: Secondary | ICD-10-CM | POA: Diagnosis not present

## 2014-02-21 DIAGNOSIS — J3081 Allergic rhinitis due to animal (cat) (dog) hair and dander: Secondary | ICD-10-CM | POA: Diagnosis not present

## 2014-02-21 DIAGNOSIS — J301 Allergic rhinitis due to pollen: Secondary | ICD-10-CM | POA: Diagnosis not present

## 2014-02-28 DIAGNOSIS — J3089 Other allergic rhinitis: Secondary | ICD-10-CM | POA: Diagnosis not present

## 2014-02-28 DIAGNOSIS — J301 Allergic rhinitis due to pollen: Secondary | ICD-10-CM | POA: Diagnosis not present

## 2014-03-07 DIAGNOSIS — J301 Allergic rhinitis due to pollen: Secondary | ICD-10-CM | POA: Diagnosis not present

## 2014-03-07 DIAGNOSIS — J3089 Other allergic rhinitis: Secondary | ICD-10-CM | POA: Diagnosis not present

## 2014-03-07 DIAGNOSIS — J3081 Allergic rhinitis due to animal (cat) (dog) hair and dander: Secondary | ICD-10-CM | POA: Diagnosis not present

## 2014-03-14 DIAGNOSIS — J301 Allergic rhinitis due to pollen: Secondary | ICD-10-CM | POA: Diagnosis not present

## 2014-03-14 DIAGNOSIS — J3089 Other allergic rhinitis: Secondary | ICD-10-CM | POA: Diagnosis not present

## 2014-03-14 DIAGNOSIS — J3081 Allergic rhinitis due to animal (cat) (dog) hair and dander: Secondary | ICD-10-CM | POA: Diagnosis not present

## 2014-03-23 DIAGNOSIS — J301 Allergic rhinitis due to pollen: Secondary | ICD-10-CM | POA: Diagnosis not present

## 2014-03-23 DIAGNOSIS — J3089 Other allergic rhinitis: Secondary | ICD-10-CM | POA: Diagnosis not present

## 2014-03-23 DIAGNOSIS — J3081 Allergic rhinitis due to animal (cat) (dog) hair and dander: Secondary | ICD-10-CM | POA: Diagnosis not present

## 2014-03-29 DIAGNOSIS — J301 Allergic rhinitis due to pollen: Secondary | ICD-10-CM | POA: Diagnosis not present

## 2014-03-29 DIAGNOSIS — J3081 Allergic rhinitis due to animal (cat) (dog) hair and dander: Secondary | ICD-10-CM | POA: Diagnosis not present

## 2014-03-29 DIAGNOSIS — J3089 Other allergic rhinitis: Secondary | ICD-10-CM | POA: Diagnosis not present

## 2014-04-04 DIAGNOSIS — J3089 Other allergic rhinitis: Secondary | ICD-10-CM | POA: Diagnosis not present

## 2014-04-04 DIAGNOSIS — J301 Allergic rhinitis due to pollen: Secondary | ICD-10-CM | POA: Diagnosis not present

## 2014-04-04 DIAGNOSIS — J3081 Allergic rhinitis due to animal (cat) (dog) hair and dander: Secondary | ICD-10-CM | POA: Diagnosis not present

## 2014-04-05 DIAGNOSIS — H2513 Age-related nuclear cataract, bilateral: Secondary | ICD-10-CM | POA: Diagnosis not present

## 2014-04-05 DIAGNOSIS — H52202 Unspecified astigmatism, left eye: Secondary | ICD-10-CM | POA: Diagnosis not present

## 2014-04-05 DIAGNOSIS — H5201 Hypermetropia, right eye: Secondary | ICD-10-CM | POA: Diagnosis not present

## 2014-04-05 DIAGNOSIS — H5212 Myopia, left eye: Secondary | ICD-10-CM | POA: Diagnosis not present

## 2014-04-11 DIAGNOSIS — J3089 Other allergic rhinitis: Secondary | ICD-10-CM | POA: Diagnosis not present

## 2014-04-11 DIAGNOSIS — J301 Allergic rhinitis due to pollen: Secondary | ICD-10-CM | POA: Diagnosis not present

## 2014-04-11 DIAGNOSIS — J3081 Allergic rhinitis due to animal (cat) (dog) hair and dander: Secondary | ICD-10-CM | POA: Diagnosis not present

## 2014-04-18 DIAGNOSIS — J301 Allergic rhinitis due to pollen: Secondary | ICD-10-CM | POA: Diagnosis not present

## 2014-04-18 DIAGNOSIS — J3081 Allergic rhinitis due to animal (cat) (dog) hair and dander: Secondary | ICD-10-CM | POA: Diagnosis not present

## 2014-04-18 DIAGNOSIS — J3089 Other allergic rhinitis: Secondary | ICD-10-CM | POA: Diagnosis not present

## 2014-04-25 DIAGNOSIS — J3081 Allergic rhinitis due to animal (cat) (dog) hair and dander: Secondary | ICD-10-CM | POA: Diagnosis not present

## 2014-04-25 DIAGNOSIS — J301 Allergic rhinitis due to pollen: Secondary | ICD-10-CM | POA: Diagnosis not present

## 2014-04-25 DIAGNOSIS — J3089 Other allergic rhinitis: Secondary | ICD-10-CM | POA: Diagnosis not present

## 2014-04-27 DIAGNOSIS — J3081 Allergic rhinitis due to animal (cat) (dog) hair and dander: Secondary | ICD-10-CM | POA: Diagnosis not present

## 2014-04-27 DIAGNOSIS — J301 Allergic rhinitis due to pollen: Secondary | ICD-10-CM | POA: Diagnosis not present

## 2014-05-02 DIAGNOSIS — J3081 Allergic rhinitis due to animal (cat) (dog) hair and dander: Secondary | ICD-10-CM | POA: Diagnosis not present

## 2014-05-02 DIAGNOSIS — J301 Allergic rhinitis due to pollen: Secondary | ICD-10-CM | POA: Diagnosis not present

## 2014-05-02 DIAGNOSIS — J3089 Other allergic rhinitis: Secondary | ICD-10-CM | POA: Diagnosis not present

## 2014-05-09 DIAGNOSIS — J3089 Other allergic rhinitis: Secondary | ICD-10-CM | POA: Diagnosis not present

## 2014-05-09 DIAGNOSIS — J301 Allergic rhinitis due to pollen: Secondary | ICD-10-CM | POA: Diagnosis not present

## 2014-05-10 ENCOUNTER — Other Ambulatory Visit: Payer: Medicare Other

## 2014-05-12 ENCOUNTER — Encounter: Payer: Medicare Other | Admitting: Internal Medicine

## 2014-05-16 DIAGNOSIS — J301 Allergic rhinitis due to pollen: Secondary | ICD-10-CM | POA: Diagnosis not present

## 2014-05-16 DIAGNOSIS — J3089 Other allergic rhinitis: Secondary | ICD-10-CM | POA: Diagnosis not present

## 2014-05-16 DIAGNOSIS — J3081 Allergic rhinitis due to animal (cat) (dog) hair and dander: Secondary | ICD-10-CM | POA: Diagnosis not present

## 2014-05-23 DIAGNOSIS — J3089 Other allergic rhinitis: Secondary | ICD-10-CM | POA: Diagnosis not present

## 2014-05-23 DIAGNOSIS — J3081 Allergic rhinitis due to animal (cat) (dog) hair and dander: Secondary | ICD-10-CM | POA: Diagnosis not present

## 2014-05-23 DIAGNOSIS — J301 Allergic rhinitis due to pollen: Secondary | ICD-10-CM | POA: Diagnosis not present

## 2014-05-30 DIAGNOSIS — J3081 Allergic rhinitis due to animal (cat) (dog) hair and dander: Secondary | ICD-10-CM | POA: Diagnosis not present

## 2014-05-30 DIAGNOSIS — J301 Allergic rhinitis due to pollen: Secondary | ICD-10-CM | POA: Diagnosis not present

## 2014-05-30 DIAGNOSIS — J3089 Other allergic rhinitis: Secondary | ICD-10-CM | POA: Diagnosis not present

## 2014-05-31 DIAGNOSIS — J3089 Other allergic rhinitis: Secondary | ICD-10-CM | POA: Diagnosis not present

## 2014-06-06 DIAGNOSIS — J3089 Other allergic rhinitis: Secondary | ICD-10-CM | POA: Diagnosis not present

## 2014-06-06 DIAGNOSIS — J301 Allergic rhinitis due to pollen: Secondary | ICD-10-CM | POA: Diagnosis not present

## 2014-06-06 DIAGNOSIS — J3081 Allergic rhinitis due to animal (cat) (dog) hair and dander: Secondary | ICD-10-CM | POA: Diagnosis not present

## 2014-06-14 ENCOUNTER — Other Ambulatory Visit: Payer: Medicare Other

## 2014-06-14 DIAGNOSIS — J301 Allergic rhinitis due to pollen: Secondary | ICD-10-CM | POA: Diagnosis not present

## 2014-06-14 DIAGNOSIS — J3089 Other allergic rhinitis: Secondary | ICD-10-CM | POA: Diagnosis not present

## 2014-06-16 DIAGNOSIS — J3081 Allergic rhinitis due to animal (cat) (dog) hair and dander: Secondary | ICD-10-CM | POA: Diagnosis not present

## 2014-06-16 DIAGNOSIS — J301 Allergic rhinitis due to pollen: Secondary | ICD-10-CM | POA: Diagnosis not present

## 2014-06-19 ENCOUNTER — Encounter: Payer: Medicare Other | Admitting: Internal Medicine

## 2014-06-20 DIAGNOSIS — J3081 Allergic rhinitis due to animal (cat) (dog) hair and dander: Secondary | ICD-10-CM | POA: Diagnosis not present

## 2014-06-20 DIAGNOSIS — J301 Allergic rhinitis due to pollen: Secondary | ICD-10-CM | POA: Diagnosis not present

## 2014-06-20 DIAGNOSIS — J3089 Other allergic rhinitis: Secondary | ICD-10-CM | POA: Diagnosis not present

## 2014-07-04 DIAGNOSIS — J3089 Other allergic rhinitis: Secondary | ICD-10-CM | POA: Diagnosis not present

## 2014-07-04 DIAGNOSIS — J3081 Allergic rhinitis due to animal (cat) (dog) hair and dander: Secondary | ICD-10-CM | POA: Diagnosis not present

## 2014-07-04 DIAGNOSIS — J301 Allergic rhinitis due to pollen: Secondary | ICD-10-CM | POA: Diagnosis not present

## 2014-07-11 DIAGNOSIS — J3089 Other allergic rhinitis: Secondary | ICD-10-CM | POA: Diagnosis not present

## 2014-07-11 DIAGNOSIS — J301 Allergic rhinitis due to pollen: Secondary | ICD-10-CM | POA: Diagnosis not present

## 2014-07-12 DIAGNOSIS — L820 Inflamed seborrheic keratosis: Secondary | ICD-10-CM | POA: Diagnosis not present

## 2014-07-12 DIAGNOSIS — D1801 Hemangioma of skin and subcutaneous tissue: Secondary | ICD-10-CM | POA: Diagnosis not present

## 2014-07-12 DIAGNOSIS — Z85828 Personal history of other malignant neoplasm of skin: Secondary | ICD-10-CM | POA: Diagnosis not present

## 2014-07-12 DIAGNOSIS — L814 Other melanin hyperpigmentation: Secondary | ICD-10-CM | POA: Diagnosis not present

## 2014-07-12 DIAGNOSIS — L821 Other seborrheic keratosis: Secondary | ICD-10-CM | POA: Diagnosis not present

## 2014-07-12 DIAGNOSIS — D225 Melanocytic nevi of trunk: Secondary | ICD-10-CM | POA: Diagnosis not present

## 2014-07-18 DIAGNOSIS — J301 Allergic rhinitis due to pollen: Secondary | ICD-10-CM | POA: Diagnosis not present

## 2014-07-18 DIAGNOSIS — J3081 Allergic rhinitis due to animal (cat) (dog) hair and dander: Secondary | ICD-10-CM | POA: Diagnosis not present

## 2014-07-18 DIAGNOSIS — J3089 Other allergic rhinitis: Secondary | ICD-10-CM | POA: Diagnosis not present

## 2014-07-19 DIAGNOSIS — H2513 Age-related nuclear cataract, bilateral: Secondary | ICD-10-CM | POA: Diagnosis not present

## 2014-07-19 DIAGNOSIS — H1859 Other hereditary corneal dystrophies: Secondary | ICD-10-CM | POA: Diagnosis not present

## 2014-07-25 DIAGNOSIS — J301 Allergic rhinitis due to pollen: Secondary | ICD-10-CM | POA: Diagnosis not present

## 2014-07-25 DIAGNOSIS — J3089 Other allergic rhinitis: Secondary | ICD-10-CM | POA: Diagnosis not present

## 2014-07-25 DIAGNOSIS — J3081 Allergic rhinitis due to animal (cat) (dog) hair and dander: Secondary | ICD-10-CM | POA: Diagnosis not present

## 2014-08-01 DIAGNOSIS — J3081 Allergic rhinitis due to animal (cat) (dog) hair and dander: Secondary | ICD-10-CM | POA: Diagnosis not present

## 2014-08-01 DIAGNOSIS — J3089 Other allergic rhinitis: Secondary | ICD-10-CM | POA: Diagnosis not present

## 2014-08-01 DIAGNOSIS — J301 Allergic rhinitis due to pollen: Secondary | ICD-10-CM | POA: Diagnosis not present

## 2014-08-07 ENCOUNTER — Other Ambulatory Visit: Payer: Self-pay

## 2014-08-08 DIAGNOSIS — J301 Allergic rhinitis due to pollen: Secondary | ICD-10-CM | POA: Diagnosis not present

## 2014-08-08 DIAGNOSIS — J3089 Other allergic rhinitis: Secondary | ICD-10-CM | POA: Diagnosis not present

## 2014-08-08 DIAGNOSIS — J3081 Allergic rhinitis due to animal (cat) (dog) hair and dander: Secondary | ICD-10-CM | POA: Diagnosis not present

## 2014-08-15 DIAGNOSIS — J301 Allergic rhinitis due to pollen: Secondary | ICD-10-CM | POA: Diagnosis not present

## 2014-08-15 DIAGNOSIS — J3089 Other allergic rhinitis: Secondary | ICD-10-CM | POA: Diagnosis not present

## 2014-08-15 DIAGNOSIS — J3081 Allergic rhinitis due to animal (cat) (dog) hair and dander: Secondary | ICD-10-CM | POA: Diagnosis not present

## 2014-08-16 DIAGNOSIS — H1859 Other hereditary corneal dystrophies: Secondary | ICD-10-CM | POA: Diagnosis not present

## 2014-08-16 DIAGNOSIS — H2513 Age-related nuclear cataract, bilateral: Secondary | ICD-10-CM | POA: Diagnosis not present

## 2014-08-17 ENCOUNTER — Other Ambulatory Visit: Payer: Medicare Other

## 2014-08-17 DIAGNOSIS — E785 Hyperlipidemia, unspecified: Secondary | ICD-10-CM | POA: Diagnosis not present

## 2014-08-18 LAB — CBC WITH DIFFERENTIAL
BASOS ABS: 0 10*3/uL (ref 0.0–0.2)
Basos: 1 %
EOS (ABSOLUTE): 0.2 10*3/uL (ref 0.0–0.4)
Eos: 4 %
HEMOGLOBIN: 13.9 g/dL (ref 11.1–15.9)
Hematocrit: 41.6 % (ref 34.0–46.6)
IMMATURE GRANULOCYTES: 0 %
Immature Grans (Abs): 0 10*3/uL (ref 0.0–0.1)
LYMPHS: 38 %
Lymphocytes Absolute: 2 10*3/uL (ref 0.7–3.1)
MCH: 29.5 pg (ref 26.6–33.0)
MCHC: 33.4 g/dL (ref 31.5–35.7)
MCV: 88 fL (ref 79–97)
MONOCYTES: 6 %
Monocytes Absolute: 0.3 10*3/uL (ref 0.1–0.9)
NEUTROS ABS: 2.7 10*3/uL (ref 1.4–7.0)
Neutrophils: 51 %
RBC: 4.71 x10E6/uL (ref 3.77–5.28)
RDW: 13.6 % (ref 12.3–15.4)
WBC: 5.3 10*3/uL (ref 3.4–10.8)

## 2014-08-18 LAB — COMPREHENSIVE METABOLIC PANEL
ALK PHOS: 67 IU/L (ref 39–117)
ALT: 15 IU/L (ref 0–32)
AST: 19 IU/L (ref 0–40)
Albumin/Globulin Ratio: 2 (ref 1.1–2.5)
Albumin: 4.1 g/dL (ref 3.5–4.8)
BUN/Creatinine Ratio: 20 (ref 11–26)
BUN: 16 mg/dL (ref 8–27)
Bilirubin Total: 0.2 mg/dL (ref 0.0–1.2)
CHLORIDE: 102 mmol/L (ref 97–108)
CO2: 26 mmol/L (ref 18–29)
Calcium: 8.9 mg/dL (ref 8.7–10.3)
Creatinine, Ser: 0.8 mg/dL (ref 0.57–1.00)
GFR calc Af Amer: 86 mL/min/{1.73_m2} (ref >59)
GFR calc non Af Amer: 74 mL/min/{1.73_m2} (ref >59)
GLUCOSE: 95 mg/dL (ref 65–99)
Globulin, Total: 2.1 g/dL (ref 1.5–4.5)
Potassium: 4.8 mmol/L (ref 3.5–5.2)
Sodium: 141 mmol/L (ref 134–144)
TOTAL PROTEIN: 6.2 g/dL (ref 6.0–8.5)

## 2014-08-18 LAB — LIPID PANEL
CHOL/HDL RATIO: 3.6 ratio (ref 0.0–4.4)
Cholesterol, Total: 196 mg/dL (ref 100–199)
HDL: 55 mg/dL (ref >39)
LDL CALC: 119 mg/dL — AB (ref 0–99)
Triglycerides: 108 mg/dL (ref 0–149)
VLDL CHOLESTEROL CAL: 22 mg/dL (ref 5–40)

## 2014-08-21 ENCOUNTER — Ambulatory Visit (INDEPENDENT_AMBULATORY_CARE_PROVIDER_SITE_OTHER): Payer: Medicare Other | Admitting: Internal Medicine

## 2014-08-21 ENCOUNTER — Encounter: Payer: Self-pay | Admitting: Internal Medicine

## 2014-08-21 VITALS — BP 132/80 | HR 88 | Temp 98.0°F | Resp 20 | Ht 61.0 in | Wt 156.6 lb

## 2014-08-21 DIAGNOSIS — Z23 Encounter for immunization: Secondary | ICD-10-CM

## 2014-08-21 DIAGNOSIS — H1859 Other hereditary corneal dystrophies: Secondary | ICD-10-CM

## 2014-08-21 DIAGNOSIS — H18529 Epithelial (juvenile) corneal dystrophy, unspecified eye: Secondary | ICD-10-CM | POA: Insufficient documentation

## 2014-08-21 DIAGNOSIS — I10 Essential (primary) hypertension: Secondary | ICD-10-CM

## 2014-08-21 DIAGNOSIS — M8580 Other specified disorders of bone density and structure, unspecified site: Secondary | ICD-10-CM | POA: Diagnosis not present

## 2014-08-21 DIAGNOSIS — N951 Menopausal and female climacteric states: Secondary | ICD-10-CM | POA: Diagnosis not present

## 2014-08-21 DIAGNOSIS — H269 Unspecified cataract: Secondary | ICD-10-CM | POA: Diagnosis not present

## 2014-08-21 DIAGNOSIS — J301 Allergic rhinitis due to pollen: Secondary | ICD-10-CM

## 2014-08-21 DIAGNOSIS — Z0000 Encounter for general adult medical examination without abnormal findings: Secondary | ICD-10-CM

## 2014-08-21 DIAGNOSIS — G90521 Complex regional pain syndrome I of right lower limb: Secondary | ICD-10-CM

## 2014-08-21 NOTE — Progress Notes (Signed)
Patient ID: [name], female   DOB: 09/23/1945, 69 y.o.   MRN: [id number]   Location:  [facility]  Goals of Care: Advanced Directive information Does patient have an advance directive?: Yes, Type of Advance Directive: [place];Living will, Does patient want to make changes to advanced directive?: No - Patient declined   Chief Complaint  Patient presents with  . Annual Exam    Annual wellness exam  . Medical Management of Chronic Issues    HPI: Patient is a 69 y.o. white female seen in the office today for her annual wellness exam as well as for her medical mgt of chronic diseases.    BLood pressure is well controlled without medicine.  Usually runs in 120s/70s-80s at home.  Watches her sodium in her diet.    Says she is better than she's been in 10 yrs.  Had total ankle replacement and took her a couple of years to recover.  She is walking and doing 5Ks.  Trains and shows dog--didn't think she'd ever get back to showing them herself.  Denies aches and pains. Has complex regional pain syndrome across the bottom of her foot and had been on gabapentin--this was a result of her ankle surgery.  If she runs on the top of her foot, there is still a little numbness.  Also had scar tissue removed on her lateral right foot. Feet get cold and wears socks at night.  She was the first ankle replacement done at [facility].    Has allergies managed with medication from allergist.  Is on immunotherapy.  Symptoms are fewer.  Pollens, molds, dogs are all responsible.  Testing has been consistent since her 48s for this.    Had bone density last fall.  Was on fosamax for a while.  This was stopped last year.  Does her weightbearing exercises.  Takes ca with D and additional D and eats and drinks dairy.   Occasionally uses premarin cream if vaginal dryness occurs.  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  She also has cataracts and is pending surgery for them.  Annual wellness screening:   She is up to date on vaccines except for her prevnar which is given today.   MMSE - Mini Mental State Exam 08/21/2014  Orientation to time 5  Orientation to Place 5  Registration 3  Attention/ Calculation 5  Recall 3  Language- name 2 objects 2  Language- repeat 1  Language- follow 3 step command 3  Language- read & follow direction 1  Write a sentence 1  Copy design 1  Total score 30  Passed clock.  She is not depressed per [facility] today, has not fallen.  She exercises regularly.  Had BMD and mammo 2015.  Has advance directive and will bring [place] a copy.  Review of Systems:  Review of Systems  Constitutional: Negative for fever, chills and malaise/fatigue.  HENT: Negative for congestion.   Eyes: Negative for blurred vision, pain, discharge and redness.       Cataracts and map dot fingerprint dystrophy  Respiratory: Negative for cough and shortness of breath.   Cardiovascular: Negative for chest pain, palpitations and leg swelling.  Gastrointestinal: Negative for abdominal pain, constipation, blood in stool and melena.  Genitourinary: Negative for dysuria, urgency and frequency.  Musculoskeletal: Negative for falls.  Skin: Negative for itching and rash.  Neurological: Positive for tingling. Negative for dizziness, loss of consciousness, weakness and headaches.       Mild in right ankle/foot  Endo/Heme/Allergies: Positive for environmental allergies. Does not bruise/bleed easily.  Psychiatric/Behavioral: Negative for depression and memory loss. The patient is not nervous/anxious and does not have insomnia.     Past Medical History  Diagnosis Date  . Hypertension     been resolved >1 year.  . Hyperlipidemia   . Pain in joint, ankle and foot   . Contact dermatitis and other eczema, due to unspecified cause   . Other and unspecified hyperlipidemia   .  Encounter for long-term (current) use of other medications   . Screening for malignant neoplasm of the cervix     Past Surgical History  Procedure Laterality Date  . 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 Ankle surgery  05/2006    Right Ankle  . Tonsillectomy      @ 69 y.o.  . Diagnostic laparoscopy  1980  . Tubal ligation  1980  . Rhinoplasty  1969  . Carpel tunnel release  [id number]    [id number]), Left(2006)  . Knee arthroplasty  05/2007    Left  . Total ankle arthroplasty  10/30/2011    Procedure: TOTAL ANKLE ARTHOPLASTY;  Surgeon: Wylene Simmer, MD;  Location: [place];  Service: Orthopedics;  Laterality: Right;  RIGHT TOTAL ANKLE REPLACEMENT, POSSIBLE GASTROC RESESSION    Allergies  Allergen Reactions  . Morphine And Related Itching   Medications: Patient's Medications  New Prescriptions   No medications on file  Previous Medications   ASCORBIC ACID (VITAMIN C) 1000 MG TABLET    Take 1,000 mg by mouth daily.   ASPIRIN EC 81 MG TABLET    Take 81 mg by mouth daily.   B COMPLEX VITAMINS (VITAMIN B-COMPLEX) TABS    Take 1 tablet by mouth daily.    CALCIUM CARB-CHOLECALCIFEROL (CALCIUM 1000 + D PO)    Take 1 tablet by mouth daily.    CHOLECALCIFEROL (VITAMIN D) 1000 UNITS TABLET    Take 1 tablet (1,000 Units total) by mouth daily.   CONJUGATED ESTROGENS (PREMARIN) VAGINAL CREAM    administer 0.5 g vaginally 3 x a week   DESLORATADINE (CLARINEX) 5 MG TABLET    Take 5 mg by mouth daily.   FLUTICASONE (FLONASE) 50 MCG/ACT NASAL SPRAY    Place 1 spray into both nostrils daily.   MONTELUKAST (SINGULAIR) 10 MG TABLET    Take 10 mg by mouth at bedtime.   MULTIPLE VITAMIN (MULTIVITAMIN WITH MINERALS) TABS    Take 1 tablet by mouth daily.  Modified Medications   No medications on file  Discontinued Medications   GABAPENTIN (NEURONTIN) 100 MG CAPSULE    Take 100 mg by mouth daily.     Physical Exam: Filed Vitals:   08/21/14 1321  BP: 132/80  Pulse: 88  Temp: 98 F (36.7  C)  TempSrc: Oral  Resp: 20  Height: 5\' 1"  (1.549 m)  Weight: 156 lb 9.6 oz (71.033 kg)  SpO2: 96%   Physical Exam  Constitutional: She is oriented to person, place, and time. She appears well-developed and well-nourished. No distress.  HENT:  Head: Normocephalic and atraumatic.  Right Ear: External ear normal.  Left Ear: External ear normal.  Nose: Nose normal.  Mouth/Throat: Oropharynx is clear and moist. No oropharyngeal exudate.  Eyes: Conjunctivae and EOM are normal. Pupils are equal, round, and reactive to light.  Neck: Normal  range of motion. Neck supple. No JVD present. No tracheal deviation present. No thyromegaly present.  Cardiovascular: Normal rate, regular rhythm, normal heart sounds and intact distal pulses.   Pulmonary/Chest: Effort normal and breath sounds normal. No respiratory distress. Right breast exhibits no inverted nipple, no mass, no nipple discharge, no skin change and no tenderness. Left breast exhibits no inverted nipple, no mass, no nipple discharge, no skin change and no tenderness.  Abdominal: Soft. Bowel sounds are normal. She exhibits no distension and no mass. There is no tenderness.  Musculoskeletal: Normal range of motion. She exhibits no tenderness.  Right ankle swelling around lateral malleolus  Neurological: She is alert and oriented to person, place, and time. She has normal reflexes. No cranial nerve deficit.  Skin: Skin is warm and dry.  Scar on right ankle and foot  Psychiatric: She has a normal mood and affect. Her behavior is normal. Judgment and thought content normal.    Labs reviewed: Basic Metabolic Panel:  Recent Labs  12/12/13 2304 08/17/14 0906  NA 137 141  K 4.1 4.8  CL 100 102  CO2 24 26  GLUCOSE 148* 95  BUN 22 16  CREATININE 0.83 0.80  CALCIUM 8.9 8.9   Liver Function Tests:  Recent Labs  12/12/13 2304 08/17/14 0906  AST 24 19  ALT 17 15  ALKPHOS 77 67  BILITOT <0.2* 0.2  PROT 6.9 6.2  ALBUMIN 3.7  --     No results for input(s): LIPASE, AMYLASE in the last 8760 hours. No results for input(s): AMMONIA in the last 8760 hours. CBC:  Recent Labs  12/12/13 2304 08/17/14 0906  WBC 7.4 5.3  NEUTROABS 4.7 2.7  HGB 14.0  --   HCT 41.2 41.6  MCV 87.3  --   PLT 184  --    Lipid Panel:  Recent Labs  08/17/14 0906  CHOL 196  HDL 55  LDLCALC 119*  TRIG 108  CHOLHDL 3.6    Assessment/Plan 1. Encounter for Medicare annual wellness exam -see details above in hpi  -depression screen negative  2. Essential hypertension -bp well controlled with diet and exercise -cont current regimen  3. Allergic rhinitis due to pollen -improved, cont flonase, singulair, clarinex per allergist  4. Complex regional pain syndrome of lower limb, right -improved, no longer requiring gabapentin, cont exercise regimen  5. Osteopenia -stable, cont ca with D, additional D and weight bearing execise -f/u bone density in 9/17  6. 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 Cataracts, bilateral -for future cataract surgery before #7 may recur  9. Need for vaccination with 13-polyvalent pneumococcal conjugate vaccine -prevnar was given today, has had pneumovax  Labs/tests ordered:  No new today Next appt:  6 mos with Janett Billow, NP  Rayhaan Huster L. Rasaan Brotherton, D.O. [facility] [address], [place] [address] Cell Phone (Mon-Fri 8am-5pm):  [phone] On Call:  [phone] & follow prompts after 5pm & weekends Office Phone:  [phone] Office Fax:  [phone]

## 2014-08-21 NOTE — Progress Notes (Signed)
Passed clock test 

## 2014-08-22 DIAGNOSIS — J3089 Other allergic rhinitis: Secondary | ICD-10-CM | POA: Diagnosis not present

## 2014-08-22 DIAGNOSIS — J301 Allergic rhinitis due to pollen: Secondary | ICD-10-CM | POA: Diagnosis not present

## 2014-08-22 DIAGNOSIS — J3081 Allergic rhinitis due to animal (cat) (dog) hair and dander: Secondary | ICD-10-CM | POA: Diagnosis not present

## 2014-08-29 DIAGNOSIS — J3089 Other allergic rhinitis: Secondary | ICD-10-CM | POA: Diagnosis not present

## 2014-08-29 DIAGNOSIS — J301 Allergic rhinitis due to pollen: Secondary | ICD-10-CM | POA: Diagnosis not present

## 2014-09-05 DIAGNOSIS — J3089 Other allergic rhinitis: Secondary | ICD-10-CM | POA: Diagnosis not present

## 2014-09-05 DIAGNOSIS — J301 Allergic rhinitis due to pollen: Secondary | ICD-10-CM | POA: Diagnosis not present

## 2014-09-05 DIAGNOSIS — J3081 Allergic rhinitis due to animal (cat) (dog) hair and dander: Secondary | ICD-10-CM | POA: Diagnosis not present

## 2014-09-12 DIAGNOSIS — J301 Allergic rhinitis due to pollen: Secondary | ICD-10-CM | POA: Diagnosis not present

## 2014-09-12 DIAGNOSIS — J3089 Other allergic rhinitis: Secondary | ICD-10-CM | POA: Diagnosis not present

## 2014-09-19 DIAGNOSIS — J3089 Other allergic rhinitis: Secondary | ICD-10-CM | POA: Diagnosis not present

## 2014-09-19 DIAGNOSIS — J301 Allergic rhinitis due to pollen: Secondary | ICD-10-CM | POA: Diagnosis not present

## 2014-09-19 DIAGNOSIS — J3081 Allergic rhinitis due to animal (cat) (dog) hair and dander: Secondary | ICD-10-CM | POA: Diagnosis not present

## 2014-09-26 DIAGNOSIS — J301 Allergic rhinitis due to pollen: Secondary | ICD-10-CM | POA: Diagnosis not present

## 2014-09-26 DIAGNOSIS — J3081 Allergic rhinitis due to animal (cat) (dog) hair and dander: Secondary | ICD-10-CM | POA: Diagnosis not present

## 2014-09-26 DIAGNOSIS — Z7982 Long term (current) use of aspirin: Secondary | ICD-10-CM | POA: Diagnosis not present

## 2014-09-26 DIAGNOSIS — Z87891 Personal history of nicotine dependence: Secondary | ICD-10-CM | POA: Diagnosis not present

## 2014-09-26 DIAGNOSIS — Z8669 Personal history of other diseases of the nervous system and sense organs: Secondary | ICD-10-CM | POA: Diagnosis not present

## 2014-09-26 DIAGNOSIS — H2512 Age-related nuclear cataract, left eye: Secondary | ICD-10-CM | POA: Diagnosis not present

## 2014-09-26 DIAGNOSIS — J3089 Other allergic rhinitis: Secondary | ICD-10-CM | POA: Diagnosis not present

## 2014-10-02 DIAGNOSIS — Z96661 Presence of right artificial ankle joint: Secondary | ICD-10-CM | POA: Diagnosis not present

## 2014-10-02 DIAGNOSIS — M19071 Primary osteoarthritis, right ankle and foot: Secondary | ICD-10-CM | POA: Diagnosis not present

## 2014-10-03 DIAGNOSIS — Z87891 Personal history of nicotine dependence: Secondary | ICD-10-CM | POA: Diagnosis not present

## 2014-10-03 DIAGNOSIS — H25812 Combined forms of age-related cataract, left eye: Secondary | ICD-10-CM | POA: Diagnosis not present

## 2014-10-03 DIAGNOSIS — M159 Polyosteoarthritis, unspecified: Secondary | ICD-10-CM | POA: Diagnosis not present

## 2014-10-03 DIAGNOSIS — H1852 Epithelial (juvenile) corneal dystrophy: Secondary | ICD-10-CM | POA: Diagnosis not present

## 2014-10-03 HISTORY — PX: CATARACT EXTRACTION: SUR2

## 2014-10-04 DIAGNOSIS — Z961 Presence of intraocular lens: Secondary | ICD-10-CM | POA: Diagnosis not present

## 2014-10-04 DIAGNOSIS — J3089 Other allergic rhinitis: Secondary | ICD-10-CM | POA: Diagnosis not present

## 2014-10-04 DIAGNOSIS — J301 Allergic rhinitis due to pollen: Secondary | ICD-10-CM | POA: Diagnosis not present

## 2014-10-04 DIAGNOSIS — Z48810 Encounter for surgical aftercare following surgery on the sense organs: Secondary | ICD-10-CM | POA: Diagnosis not present

## 2014-10-04 DIAGNOSIS — Z9842 Cataract extraction status, left eye: Secondary | ICD-10-CM | POA: Diagnosis not present

## 2014-10-04 DIAGNOSIS — Z87891 Personal history of nicotine dependence: Secondary | ICD-10-CM | POA: Diagnosis not present

## 2014-10-04 DIAGNOSIS — J3081 Allergic rhinitis due to animal (cat) (dog) hair and dander: Secondary | ICD-10-CM | POA: Diagnosis not present

## 2014-10-04 DIAGNOSIS — Z7982 Long term (current) use of aspirin: Secondary | ICD-10-CM | POA: Diagnosis not present

## 2014-10-04 DIAGNOSIS — H269 Unspecified cataract: Secondary | ICD-10-CM | POA: Diagnosis not present

## 2014-10-12 DIAGNOSIS — J301 Allergic rhinitis due to pollen: Secondary | ICD-10-CM | POA: Diagnosis not present

## 2014-10-12 DIAGNOSIS — J3081 Allergic rhinitis due to animal (cat) (dog) hair and dander: Secondary | ICD-10-CM | POA: Diagnosis not present

## 2014-10-12 DIAGNOSIS — J3089 Other allergic rhinitis: Secondary | ICD-10-CM | POA: Diagnosis not present

## 2014-10-19 ENCOUNTER — Other Ambulatory Visit: Payer: Self-pay

## 2014-10-19 DIAGNOSIS — Z1231 Encounter for screening mammogram for malignant neoplasm of breast: Secondary | ICD-10-CM

## 2014-10-19 DIAGNOSIS — J3089 Other allergic rhinitis: Secondary | ICD-10-CM | POA: Diagnosis not present

## 2014-10-19 DIAGNOSIS — J3081 Allergic rhinitis due to animal (cat) (dog) hair and dander: Secondary | ICD-10-CM | POA: Diagnosis not present

## 2014-10-19 DIAGNOSIS — J301 Allergic rhinitis due to pollen: Secondary | ICD-10-CM | POA: Diagnosis not present

## 2014-11-02 DIAGNOSIS — J3081 Allergic rhinitis due to animal (cat) (dog) hair and dander: Secondary | ICD-10-CM | POA: Diagnosis not present

## 2014-11-02 DIAGNOSIS — J301 Allergic rhinitis due to pollen: Secondary | ICD-10-CM | POA: Diagnosis not present

## 2014-11-02 DIAGNOSIS — J3089 Other allergic rhinitis: Secondary | ICD-10-CM | POA: Diagnosis not present

## 2014-11-08 ENCOUNTER — Ambulatory Visit
Admission: RE | Admit: 2014-11-08 | Discharge: 2014-11-08 | Disposition: A | Discharge status: Home / Self Care | Payer: Medicare Other | Source: Ambulatory Visit

## 2014-11-08 DIAGNOSIS — J3081 Allergic rhinitis due to animal (cat) (dog) hair and dander: Secondary | ICD-10-CM | POA: Diagnosis not present

## 2014-11-08 DIAGNOSIS — J3089 Other allergic rhinitis: Secondary | ICD-10-CM | POA: Diagnosis not present

## 2014-11-08 DIAGNOSIS — Z1231 Encounter for screening mammogram for malignant neoplasm of breast: Secondary | ICD-10-CM | POA: Diagnosis not present

## 2014-11-08 DIAGNOSIS — J301 Allergic rhinitis due to pollen: Secondary | ICD-10-CM | POA: Diagnosis not present

## 2014-11-10 DIAGNOSIS — M19041 Primary osteoarthritis, right hand: Secondary | ICD-10-CM | POA: Diagnosis not present

## 2014-11-10 DIAGNOSIS — M65321 Trigger finger, right index finger: Secondary | ICD-10-CM | POA: Diagnosis not present

## 2014-11-13 DIAGNOSIS — J3089 Other allergic rhinitis: Secondary | ICD-10-CM | POA: Diagnosis not present

## 2014-11-14 DIAGNOSIS — J3081 Allergic rhinitis due to animal (cat) (dog) hair and dander: Secondary | ICD-10-CM | POA: Diagnosis not present

## 2014-11-14 DIAGNOSIS — J301 Allergic rhinitis due to pollen: Secondary | ICD-10-CM | POA: Diagnosis not present

## 2014-11-14 DIAGNOSIS — J3089 Other allergic rhinitis: Secondary | ICD-10-CM | POA: Diagnosis not present

## 2014-11-16 DIAGNOSIS — M159 Polyosteoarthritis, unspecified: Secondary | ICD-10-CM | POA: Diagnosis not present

## 2014-11-16 DIAGNOSIS — H25811 Combined forms of age-related cataract, right eye: Secondary | ICD-10-CM | POA: Diagnosis not present

## 2014-11-16 DIAGNOSIS — Z87891 Personal history of nicotine dependence: Secondary | ICD-10-CM | POA: Diagnosis not present

## 2014-11-16 HISTORY — PX: CATARACT EXTRACTION: SUR2

## 2014-11-17 DIAGNOSIS — Z9841 Cataract extraction status, right eye: Secondary | ICD-10-CM | POA: Diagnosis not present

## 2014-11-17 DIAGNOSIS — Z9842 Cataract extraction status, left eye: Secondary | ICD-10-CM | POA: Diagnosis not present

## 2014-11-17 DIAGNOSIS — Z87891 Personal history of nicotine dependence: Secondary | ICD-10-CM | POA: Diagnosis not present

## 2014-11-17 DIAGNOSIS — Z961 Presence of intraocular lens: Secondary | ICD-10-CM | POA: Diagnosis not present

## 2014-11-17 DIAGNOSIS — Z7982 Long term (current) use of aspirin: Secondary | ICD-10-CM | POA: Diagnosis not present

## 2014-11-17 DIAGNOSIS — Z83518 Family history of other specified eye disorder: Secondary | ICD-10-CM | POA: Diagnosis not present

## 2014-11-17 DIAGNOSIS — H1851 Endothelial corneal dystrophy: Secondary | ICD-10-CM | POA: Diagnosis not present

## 2014-11-17 DIAGNOSIS — Z48810 Encounter for surgical aftercare following surgery on the sense organs: Secondary | ICD-10-CM | POA: Diagnosis not present

## 2014-11-21 DIAGNOSIS — Z23 Encounter for immunization: Secondary | ICD-10-CM | POA: Diagnosis not present

## 2014-11-21 DIAGNOSIS — J3081 Allergic rhinitis due to animal (cat) (dog) hair and dander: Secondary | ICD-10-CM | POA: Diagnosis not present

## 2014-11-21 DIAGNOSIS — J301 Allergic rhinitis due to pollen: Secondary | ICD-10-CM | POA: Diagnosis not present

## 2014-11-21 DIAGNOSIS — J3089 Other allergic rhinitis: Secondary | ICD-10-CM | POA: Diagnosis not present

## 2014-11-27 DIAGNOSIS — J3081 Allergic rhinitis due to animal (cat) (dog) hair and dander: Secondary | ICD-10-CM | POA: Diagnosis not present

## 2014-11-27 DIAGNOSIS — J301 Allergic rhinitis due to pollen: Secondary | ICD-10-CM | POA: Diagnosis not present

## 2014-11-27 DIAGNOSIS — J3089 Other allergic rhinitis: Secondary | ICD-10-CM | POA: Diagnosis not present

## 2014-12-05 DIAGNOSIS — J301 Allergic rhinitis due to pollen: Secondary | ICD-10-CM | POA: Diagnosis not present

## 2014-12-05 DIAGNOSIS — J3089 Other allergic rhinitis: Secondary | ICD-10-CM | POA: Diagnosis not present

## 2014-12-12 DIAGNOSIS — J301 Allergic rhinitis due to pollen: Secondary | ICD-10-CM | POA: Diagnosis not present

## 2014-12-12 DIAGNOSIS — J3089 Other allergic rhinitis: Secondary | ICD-10-CM | POA: Diagnosis not present

## 2014-12-12 DIAGNOSIS — J3081 Allergic rhinitis due to animal (cat) (dog) hair and dander: Secondary | ICD-10-CM | POA: Diagnosis not present

## 2014-12-19 DIAGNOSIS — J3081 Allergic rhinitis due to animal (cat) (dog) hair and dander: Secondary | ICD-10-CM | POA: Diagnosis not present

## 2014-12-19 DIAGNOSIS — J3089 Other allergic rhinitis: Secondary | ICD-10-CM | POA: Diagnosis not present

## 2014-12-19 DIAGNOSIS — J301 Allergic rhinitis due to pollen: Secondary | ICD-10-CM | POA: Diagnosis not present

## 2014-12-26 DIAGNOSIS — J301 Allergic rhinitis due to pollen: Secondary | ICD-10-CM | POA: Diagnosis not present

## 2014-12-26 DIAGNOSIS — J3081 Allergic rhinitis due to animal (cat) (dog) hair and dander: Secondary | ICD-10-CM | POA: Diagnosis not present

## 2014-12-26 DIAGNOSIS — J3089 Other allergic rhinitis: Secondary | ICD-10-CM | POA: Diagnosis not present

## 2015-01-02 DIAGNOSIS — J3081 Allergic rhinitis due to animal (cat) (dog) hair and dander: Secondary | ICD-10-CM | POA: Diagnosis not present

## 2015-01-02 DIAGNOSIS — J3089 Other allergic rhinitis: Secondary | ICD-10-CM | POA: Diagnosis not present

## 2015-01-02 DIAGNOSIS — J301 Allergic rhinitis due to pollen: Secondary | ICD-10-CM | POA: Diagnosis not present

## 2015-01-09 DIAGNOSIS — J301 Allergic rhinitis due to pollen: Secondary | ICD-10-CM | POA: Diagnosis not present

## 2015-01-09 DIAGNOSIS — J3081 Allergic rhinitis due to animal (cat) (dog) hair and dander: Secondary | ICD-10-CM | POA: Diagnosis not present

## 2015-01-09 DIAGNOSIS — J3089 Other allergic rhinitis: Secondary | ICD-10-CM | POA: Diagnosis not present

## 2015-01-16 DIAGNOSIS — J301 Allergic rhinitis due to pollen: Secondary | ICD-10-CM | POA: Diagnosis not present

## 2015-01-16 DIAGNOSIS — J3081 Allergic rhinitis due to animal (cat) (dog) hair and dander: Secondary | ICD-10-CM | POA: Diagnosis not present

## 2015-01-16 DIAGNOSIS — J3089 Other allergic rhinitis: Secondary | ICD-10-CM | POA: Diagnosis not present

## 2015-01-23 ENCOUNTER — Encounter: Payer: Self-pay | Admitting: Nurse Practitioner

## 2015-01-23 ENCOUNTER — Ambulatory Visit (INDEPENDENT_AMBULATORY_CARE_PROVIDER_SITE_OTHER): Payer: Medicare Other | Admitting: Nurse Practitioner

## 2015-01-23 VITALS — BP 130/86 | HR 85 | Temp 98.1°F | Ht 61.0 in | Wt 169.0 lb

## 2015-01-23 DIAGNOSIS — J0120 Acute ethmoidal sinusitis, unspecified: Secondary | ICD-10-CM

## 2015-01-23 MED ORDER — AMOXICILLIN-POT CLAVULANATE 875-125 MG PO TABS: 1.0000 | ORAL_TABLET | Freq: Two times a day (BID) | ORAL | Status: DC

## 2015-01-23 NOTE — Patient Instructions (Signed)
augmentin by mouth twice daily for 1 week Cont to use netti pot twice daily and may use plain saline spray as needed    Sinusitis, Adult Sinusitis is redness, soreness, and inflammation of the paranasal sinuses. Paranasal sinuses are air pockets within the bones of your face. They are located beneath your eyes, in the middle of your forehead, and above your eyes. In healthy paranasal sinuses, mucus is able to drain out, and air is able to circulate through them by way of your nose. However, when your paranasal sinuses are inflamed, mucus and air can become trapped. This can allow bacteria and other germs to grow and cause infection. Sinusitis can develop quickly and last only a short time (acute) or continue over a long period (chronic). Sinusitis that lasts for more than 12 weeks is considered chronic. CAUSES Causes of sinusitis include:  Allergies.  Structural abnormalities, such as displacement of the cartilage that separates your nostrils (deviated septum), which can decrease the air flow through your nose and sinuses and affect sinus drainage.  Functional abnormalities, such as when the small hairs (cilia) that line your sinuses and help remove mucus do not work properly or are not present. SIGNS AND SYMPTOMS Symptoms of acute and chronic sinusitis are the same. The primary symptoms are pain and pressure around the affected sinuses. Other symptoms include:  Upper toothache.  Earache.  Headache.  Bad breath.  Decreased sense of smell and taste.  A cough, which worsens when you are lying flat.  Fatigue.  Fever.  Thick drainage from your nose, which often is green and may contain pus (purulent).  Swelling and warmth over the affected sinuses. DIAGNOSIS Your health care provider will perform a physical exam. During your exam, your health care provider may perform any of the following to help determine if you have acute sinusitis or chronic sinusitis:  Look in your nose for  signs of abnormal growths in your nostrils (nasal polyps).  Tap over the affected sinus to check for signs of infection.  View the inside of your sinuses using an imaging device that has a light attached (endoscope). If your health care provider suspects that you have chronic sinusitis, one or more of the following tests may be recommended:  Allergy tests.  Nasal culture. A sample of mucus is taken from your nose, sent to a lab, and screened for bacteria.  Nasal cytology. A sample of mucus is taken from your nose and examined by your health care provider to determine if your sinusitis is related to an allergy. TREATMENT Most cases of acute sinusitis are related to a viral infection and will resolve on their own within 10 days. Sometimes, medicines are prescribed to help relieve symptoms of both acute and chronic sinusitis. These may include pain medicines, decongestants, nasal steroid sprays, or saline sprays. However, for sinusitis related to a bacterial infection, your health care provider will prescribe antibiotic medicines. These are medicines that will help kill the bacteria causing the infection. Rarely, sinusitis is caused by a fungal infection. In these cases, your health care provider will prescribe antifungal medicine. For some cases of chronic sinusitis, surgery is needed. Generally, these are cases in which sinusitis recurs more than 3 times per year, despite other treatments. HOME CARE INSTRUCTIONS  Drink plenty of water. Water helps thin the mucus so your sinuses can drain more easily.  Use a humidifier.  Inhale steam 3-4 times a day (for example, sit in the bathroom with the shower running).  Apply  a warm, moist washcloth to your face 3-4 times a day, or as directed by your health care provider.  Use saline nasal sprays to help moisten and clean your sinuses.  Take medicines only as directed by your health care provider.  If you were prescribed either an antibiotic or  antifungal medicine, finish it all even if you start to feel better. SEEK IMMEDIATE MEDICAL CARE IF:  You have increasing pain or severe headaches.  You have nausea, vomiting, or drowsiness.  You have swelling around your face.  You have vision problems.  You have a stiff neck.  You have difficulty breathing.   This information is not intended to replace advice given to you by your health care provider. Make sure you discuss any questions you have with your health care provider.   Document Released: 01/27/2005 Document Revised: 02/17/2014 Document Reviewed: 02/11/2011 Elsevier Interactive Patient Education [employer].

## 2015-01-23 NOTE — Progress Notes (Signed)
Patient ID: [name], female   DOB: November 13, 1945, 69 y.o.   MRN: [id number]    PCP: Lauree Chandler, NP  Advanced Directive information    Allergies  Allergen Reactions  . Morphine And Related Itching    Chief Complaint  Patient presents with  . Acute Visit    ? Sinus infection- facial pain, drainage, tooth pain and overall feels bad. x 2 weeks      HPI: Patient is a 69 y.o. female seen in the office today due to possible sinus infection. Pt has been feeling poorly for over 2 week. Has increased pressure to sinuses. Started on left side now on both. Eyes hurting. Increase tooth pain (has appt with dentist to rule out tooth infection). Increase drainage. Using netti pot without much relief. Occasional productive cough due to post nasal drip. No fever but fatigue.  Taking tylenol for the pain.  Review of Systems:  Review of Systems  Constitutional: Positive for fatigue. Negative for fever, chills, activity change and unexpected weight change.  HENT: Positive for congestion, ear pain (increase pressure), postnasal drip and rhinorrhea. Negative for dental problem, hearing loss, nosebleeds and sore throat.   Respiratory: Positive for cough (mild). Negative for shortness of breath.   Cardiovascular: Negative for chest pain and leg swelling.  Neurological: Negative for dizziness, weakness and light-headedness.    Past Medical History  Diagnosis Date  . Hypertension     been resolved >1 year.  . Hyperlipidemia   . Pain in joint, ankle and foot   . Contact dermatitis and other eczema, due to unspecified cause   . Other and unspecified hyperlipidemia   . Encounter for long-term (current) use of other medications   . Screening for malignant neoplasm of the cervix    Past Surgical History  Procedure Laterality Date  . 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 Ankle surgery  05/2006    Right Ankle  . Tonsillectomy      @ 69 y.o.  . Diagnostic laparoscopy  1980  .  Tubal ligation  1980  . Rhinoplasty  1969  . Carpel tunnel release  [id number]    [id number]), Left(2006)  . Knee arthroplasty  05/2007    Left  . Total ankle arthroplasty  10/30/2011    Procedure: TOTAL ANKLE ARTHOPLASTY;  Surgeon: Wylene Simmer, MD;  Location: [place];  Service: Orthopedics;  Laterality: Right;  RIGHT TOTAL ANKLE REPLACEMENT, POSSIBLE GASTROC RESESSION  . Cataract extraction Left 10/03/14  . Cataract extraction Right 11/16/2014   Social History:   reports that she quit smoking about 35 years ago. Her smoking use included Cigarettes. She has a 16 pack-year smoking history. She has never used smokeless tobacco. She reports that she drinks alcohol. She reports that she does not use illicit drugs.  Family History  Problem Relation Age of Onset  . Cancer Mother 14    breast    Medications: Patient's Medications  New Prescriptions   No medications on file  Previous Medications   ASCORBIC ACID (VITAMIN C) 1000 MG TABLET    Take 1,000 mg by mouth daily.   ASPIRIN EC 81 MG TABLET    Take 81 mg by mouth daily.   B COMPLEX VITAMINS (VITAMIN B-COMPLEX) TABS    Take 1 tablet by mouth daily.    CALCIUM CARB-CHOLECALCIFEROL (CALCIUM 1000 + D PO)    Take 1 tablet by mouth daily.    CHOLECALCIFEROL (VITAMIN D) 1000 UNITS TABLET  Take 1 tablet (1,000 Units total) by mouth daily.   CONJUGATED ESTROGENS (PREMARIN) VAGINAL CREAM    administer 0.5 g vaginally 3 x a week   DESLORATADINE (CLARINEX) 5 MG TABLET    Take 5 mg by mouth daily.   FLUTICASONE (FLONASE) 50 MCG/ACT NASAL SPRAY    Place 1 spray into both nostrils daily.   MONTELUKAST (SINGULAIR) 10 MG TABLET    Take 10 mg by mouth at bedtime.   MULTIPLE VITAMIN (MULTIVITAMIN WITH MINERALS) TABS    Take 1 tablet by mouth daily.  Modified Medications   No medications on file  Discontinued Medications   No medications on file     Physical Exam:  Filed Vitals:   01/23/15 1453  BP: 130/86  Pulse: 85  Temp: 98.1 F (36.7 C)    TempSrc: Oral  Height: 5\' 1"  (1.549 m)  Weight: 169 lb (76.658 kg)  SpO2: 95%   Body mass index is 31.95 kg/(m^2).  Physical Exam  Constitutional: She is oriented to person, place, and time. She appears well-developed and well-nourished.  HENT:  Head: Normocephalic and atraumatic.  Right Ear: External ear normal.  Left Ear: External ear normal.  Nose: Mucosal edema and rhinorrhea present.  Mouth/Throat: Oropharynx is clear and moist. No oropharyngeal exudate.  Tenderness to sinuses   Eyes: Conjunctivae and EOM are normal. Pupils are equal, round, and reactive to light.  Neck: Normal range of motion. Neck supple.  Cardiovascular: Normal rate, regular rhythm and normal heart sounds.   Pulmonary/Chest: Effort normal and breath sounds normal. No respiratory distress.  Neurological: She is alert and oriented to person, place, and time.  Skin: Skin is warm and dry.    Labs reviewed: Basic Metabolic Panel:  Recent Labs  08/17/14 0906  NA 141  K 4.8  CL 102  CO2 26  GLUCOSE 95  BUN 16  CREATININE 0.80  CALCIUM 8.9   Liver Function Tests:  Recent Labs  08/17/14 0906  AST 19  ALT 15  ALKPHOS 67  BILITOT 0.2  PROT 6.2  ALBUMIN 4.1   No results for input(s): LIPASE, AMYLASE in the last 8760 hours. No results for input(s): AMMONIA in the last 8760 hours. CBC:  Recent Labs  08/17/14 0906  WBC 5.3  NEUTROABS 2.7  HCT 41.6   Lipid Panel:  Recent Labs  08/17/14 0906  CHOL 196  HDL 55  LDLCALC 119*  TRIG 108  CHOLHDL 3.6   TSH: No results for input(s): TSH in the last 8760 hours. A1C: No results found for: HGBA1C   Assessment/Plan 1. Acute ethmoidal sinusitis, recurrence not specified -cont nettipot, may use plain nasal saline -may use tylenol as needed - amoxicillin-clavulanate (AUGMENTIN) 875-125 MG tablet; Take 1 tablet by mouth 2 (two) times daily.  Dispense: 14 tablet; Refill: 0 - return precautions discussed   Myley Bahner K. Harle Battiest  [facility] [phone] 8 am - 5 pm) [phone] (after hours)

## 2015-01-26 DIAGNOSIS — J3089 Other allergic rhinitis: Secondary | ICD-10-CM | POA: Diagnosis not present

## 2015-01-26 DIAGNOSIS — J301 Allergic rhinitis due to pollen: Secondary | ICD-10-CM | POA: Diagnosis not present

## 2015-01-26 DIAGNOSIS — J3081 Allergic rhinitis due to animal (cat) (dog) hair and dander: Secondary | ICD-10-CM | POA: Diagnosis not present

## 2015-01-31 DIAGNOSIS — J301 Allergic rhinitis due to pollen: Secondary | ICD-10-CM | POA: Diagnosis not present

## 2015-01-31 DIAGNOSIS — J3089 Other allergic rhinitis: Secondary | ICD-10-CM | POA: Diagnosis not present

## 2015-01-31 DIAGNOSIS — J3081 Allergic rhinitis due to animal (cat) (dog) hair and dander: Secondary | ICD-10-CM | POA: Diagnosis not present

## 2015-02-07 ENCOUNTER — Other Ambulatory Visit: Payer: Self-pay | Admitting: *Deleted

## 2015-02-07 DIAGNOSIS — J3089 Other allergic rhinitis: Secondary | ICD-10-CM | POA: Diagnosis not present

## 2015-02-07 DIAGNOSIS — J3081 Allergic rhinitis due to animal (cat) (dog) hair and dander: Secondary | ICD-10-CM | POA: Diagnosis not present

## 2015-02-07 DIAGNOSIS — J301 Allergic rhinitis due to pollen: Secondary | ICD-10-CM | POA: Diagnosis not present

## 2015-02-07 DIAGNOSIS — N951 Menopausal and female climacteric states: Secondary | ICD-10-CM

## 2015-02-07 MED ORDER — ESTROGENS, CONJUGATED 0.625 MG/GM VA CREA: TOPICAL_CREAM | VAGINAL | Status: DC

## 2015-02-07 NOTE — Telephone Encounter (Signed)
Patient requested Cream to be faxed to [facility].

## 2015-02-14 DIAGNOSIS — J301 Allergic rhinitis due to pollen: Secondary | ICD-10-CM | POA: Diagnosis not present

## 2015-02-14 DIAGNOSIS — J3089 Other allergic rhinitis: Secondary | ICD-10-CM | POA: Diagnosis not present

## 2015-02-14 DIAGNOSIS — J3081 Allergic rhinitis due to animal (cat) (dog) hair and dander: Secondary | ICD-10-CM | POA: Diagnosis not present

## 2015-02-21 DIAGNOSIS — J3089 Other allergic rhinitis: Secondary | ICD-10-CM | POA: Diagnosis not present

## 2015-02-21 DIAGNOSIS — J3081 Allergic rhinitis due to animal (cat) (dog) hair and dander: Secondary | ICD-10-CM | POA: Diagnosis not present

## 2015-02-21 DIAGNOSIS — J301 Allergic rhinitis due to pollen: Secondary | ICD-10-CM | POA: Diagnosis not present

## 2015-02-22 ENCOUNTER — Encounter: Payer: Self-pay | Admitting: Nurse Practitioner

## 2015-02-22 ENCOUNTER — Ambulatory Visit (INDEPENDENT_AMBULATORY_CARE_PROVIDER_SITE_OTHER): Payer: Medicare Other | Admitting: Nurse Practitioner

## 2015-02-22 VITALS — BP 118/76 | HR 66 | Temp 97.9°F | Resp 20 | Ht 61.0 in | Wt 166.0 lb

## 2015-02-22 DIAGNOSIS — M159 Polyosteoarthritis, unspecified: Secondary | ICD-10-CM | POA: Diagnosis not present

## 2015-02-22 DIAGNOSIS — I10 Essential (primary) hypertension: Secondary | ICD-10-CM

## 2015-02-22 DIAGNOSIS — M8580 Other specified disorders of bone density and structure, unspecified site: Secondary | ICD-10-CM

## 2015-02-22 DIAGNOSIS — E785 Hyperlipidemia, unspecified: Secondary | ICD-10-CM

## 2015-02-22 NOTE — Progress Notes (Signed)
Patient ID: [name], female   DOB: 1945-10-29, 70 y.o.   MRN: [id number]    PCP: Lauree Chandler, NP  Advanced Directive information Does patient have an advance directive?: Yes  Allergies  Allergen Reactions  . Morphine And Related Itching    Chief Complaint  Patient presents with  . Medical Management of Chronic Issues    6 month follow-up for Hyperension     HPI: Patient is a 70 y.o. female seen in the office today for routine follow up on chronic conditions. Pt with hx of htn, OA, OP, seasonal allergies, vaginal dryness. Pt reports she is feeling well. Good energy. conts to travel.  Blood pressure remains at goal, not on medication.  conts to see the allergist and weekly injections.  Attempts to get 10,000 steps a day, uses fitbit Uses treadmill occasionally but not regular exercise regimen Very active.  At weight watcher now because her weight is up.  No worsening arthritis, hx of left knee and right ankle replacement  Review of Systems:  Review of Systems  Constitutional: Negative for activity change, appetite change, fatigue and unexpected weight change.  HENT: Negative for congestion and hearing loss.   Eyes: Negative.   Respiratory: Negative for cough and shortness of breath.   Cardiovascular: Negative for chest pain, palpitations and leg swelling.  Gastrointestinal: Negative for abdominal pain, diarrhea and constipation.  Genitourinary: Negative for dysuria and difficulty urinating.  Musculoskeletal: Negative for myalgias and arthralgias.  Skin: Negative for color change and wound.  Allergic/Immunologic: Positive for environmental allergies.  Neurological: Negative for dizziness and weakness.  Psychiatric/Behavioral: Negative for behavioral problems, confusion and agitation.    Past Medical History  Diagnosis Date  . Hypertension     been resolved >1 year.  . Hyperlipidemia   . Pain in joint, ankle and foot   . Contact dermatitis and other  eczema, due to unspecified cause   . Other and unspecified hyperlipidemia   . Encounter for long-term (current) use of other medications   . Screening for malignant neoplasm of the cervix    Past Surgical History  Procedure Laterality Date  . 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 Ankle surgery  05/2006    Right Ankle  . Tonsillectomy      @ 70 y.o.  . Diagnostic laparoscopy  1980  . Tubal ligation  1980  . Rhinoplasty  1969  . Carpel tunnel release  [id number]    [id number]), Left(2006)  . Knee arthroplasty  05/2007    Left  . Total ankle arthroplasty  10/30/2011    Procedure: TOTAL ANKLE ARTHOPLASTY;  Surgeon: Wylene Simmer, MD;  Location: [place];  Service: Orthopedics;  Laterality: Right;  RIGHT TOTAL ANKLE REPLACEMENT, POSSIBLE GASTROC RESESSION  . Cataract extraction Left 10/03/14  . Cataract extraction Right 11/16/2014   Social History:   reports that she quit smoking about 35 years ago. Her smoking use included Cigarettes. She has a 16 pack-year smoking history. She has never used smokeless tobacco. She reports that she drinks alcohol. She reports that she does not use illicit drugs.  Family History  Problem Relation Age of Onset  . Cancer Mother 62    breast    Medications: Patient's Medications  New Prescriptions   No medications on file  Previous Medications   ASCORBIC ACID (VITAMIN C) 1000 MG TABLET    Take 1,000 mg by mouth daily.   ASPIRIN EC 81 MG TABLET    Take  81 mg by mouth daily.   B COMPLEX VITAMINS (VITAMIN B-COMPLEX) TABS    Take 1 tablet by mouth daily.    CALCIUM CARB-CHOLECALCIFEROL (CALCIUM 1000 + D PO)    Take 1 tablet by mouth daily.    CHOLECALCIFEROL (VITAMIN D) 1000 UNITS TABLET    Take 1 tablet (1,000 Units total) by mouth daily.   CONJUGATED ESTROGENS (PREMARIN) VAGINAL CREAM    administer 0.5 g vaginally 3 x a week   DESLORATADINE (CLARINEX) 5 MG TABLET    Take 5 mg by mouth daily.   FLUTICASONE (FLONASE) 50 MCG/ACT NASAL SPRAY    Place 1  spray into both nostrils daily.   MONTELUKAST (SINGULAIR) 10 MG TABLET    Take 10 mg by mouth at bedtime.   MULTIPLE VITAMIN (MULTIVITAMIN WITH MINERALS) TABS    Take 1 tablet by mouth daily.  Modified Medications   No medications on file  Discontinued Medications   AMOXICILLIN-CLAVULANATE (AUGMENTIN) 875-125 MG TABLET    Take 1 tablet by mouth 2 (two) times daily.     Physical Exam:  Filed Vitals:   02/22/15 1018  BP: 118/76  Pulse: 66  Temp: 97.9 F (36.6 C)  TempSrc: Oral  Resp: 20  Height: 5\' 1"  (1.549 m)  Weight: 166 lb (75.297 kg)  SpO2: 96%   Body mass index is 31.38 kg/(m^2).  Physical Exam  Constitutional: She is oriented to person, place, and time. She appears well-developed and well-nourished. No distress.  HENT:  Head: Normocephalic and atraumatic.  Mouth/Throat: Oropharynx is clear and moist. No oropharyngeal exudate.  Eyes: Conjunctivae are normal. Pupils are equal, round, and reactive to light.  Neck: Normal range of motion. Neck supple.  Cardiovascular: Normal rate, regular rhythm and normal heart sounds.   Pulmonary/Chest: Effort normal and breath sounds normal.  Abdominal: Soft. Bowel sounds are normal.  Musculoskeletal: She exhibits no edema or tenderness.  Neurological: She is alert and oriented to person, place, and time.  Skin: Skin is warm and dry. She is not diaphoretic.  Psychiatric: She has a normal mood and affect.    Labs reviewed: Basic Metabolic Panel:  Recent Labs  08/17/14 0906  NA 141  K 4.8  CL 102  CO2 26  GLUCOSE 95  BUN 16  CREATININE 0.80  CALCIUM 8.9   Liver Function Tests:  Recent Labs  08/17/14 0906  AST 19  ALT 15  ALKPHOS 67  BILITOT 0.2  PROT 6.2  ALBUMIN 4.1   No results for input(s): LIPASE, AMYLASE in the last 8760 hours. No results for input(s): AMMONIA in the last 8760 hours. CBC:  Recent Labs  08/17/14 0906  WBC 5.3  NEUTROABS 2.7  HCT 41.6  MCV 88   Lipid Panel:  Recent Labs   08/17/14 0906  CHOL 196  HDL 55  LDLCALC 119*  TRIG 108  CHOLHDL 3.6   TSH: No results for input(s): TSH in the last 8760 hours. A1C: No results found for: HGBA1C   Assessment/Plan 1. Essential hypertension -blood pressure well controlled off medication -cont lifestyle modifications.  - Comprehensive metabolic panel; Future - CBC with Differential; Future  2. Osteopenia -stable, conts on calcium with vit D   3. Generalized osteoarthrosis, involving multiple sites Stable, without increase in pain. May use tylenol as needed for pain  4. Hyperlipidemia -to cont lifestyle modifications.  -will follow up lab prior to next appt.  - Comprehensive metabolic panel; Future - Lipid panel; Future  Follow up in 6 months  for EV Driana Dazey K. Harle Battiest  [facility] [phone] 8 am - 5 pm) [phone] (after hours)

## 2015-02-22 NOTE — Patient Instructions (Signed)
Cont current medication  Follow up in 6 months for extended visit with fasting lab work prior to visit

## 2015-02-28 DIAGNOSIS — J3089 Other allergic rhinitis: Secondary | ICD-10-CM | POA: Diagnosis not present

## 2015-02-28 DIAGNOSIS — J301 Allergic rhinitis due to pollen: Secondary | ICD-10-CM | POA: Diagnosis not present

## 2015-03-07 DIAGNOSIS — J3081 Allergic rhinitis due to animal (cat) (dog) hair and dander: Secondary | ICD-10-CM | POA: Diagnosis not present

## 2015-03-07 DIAGNOSIS — J301 Allergic rhinitis due to pollen: Secondary | ICD-10-CM | POA: Diagnosis not present

## 2015-03-07 DIAGNOSIS — J3089 Other allergic rhinitis: Secondary | ICD-10-CM | POA: Diagnosis not present

## 2015-03-15 DIAGNOSIS — J3081 Allergic rhinitis due to animal (cat) (dog) hair and dander: Secondary | ICD-10-CM | POA: Diagnosis not present

## 2015-03-15 DIAGNOSIS — J3089 Other allergic rhinitis: Secondary | ICD-10-CM | POA: Diagnosis not present

## 2015-03-15 DIAGNOSIS — J301 Allergic rhinitis due to pollen: Secondary | ICD-10-CM | POA: Diagnosis not present

## 2015-03-21 DIAGNOSIS — J3081 Allergic rhinitis due to animal (cat) (dog) hair and dander: Secondary | ICD-10-CM | POA: Diagnosis not present

## 2015-03-21 DIAGNOSIS — J301 Allergic rhinitis due to pollen: Secondary | ICD-10-CM | POA: Diagnosis not present

## 2015-03-21 DIAGNOSIS — J3089 Other allergic rhinitis: Secondary | ICD-10-CM | POA: Diagnosis not present

## 2015-03-22 DIAGNOSIS — J301 Allergic rhinitis due to pollen: Secondary | ICD-10-CM | POA: Diagnosis not present

## 2015-03-22 DIAGNOSIS — J3081 Allergic rhinitis due to animal (cat) (dog) hair and dander: Secondary | ICD-10-CM | POA: Diagnosis not present

## 2015-04-17 DIAGNOSIS — J301 Allergic rhinitis due to pollen: Secondary | ICD-10-CM | POA: Diagnosis not present

## 2015-04-17 DIAGNOSIS — J3081 Allergic rhinitis due to animal (cat) (dog) hair and dander: Secondary | ICD-10-CM | POA: Diagnosis not present

## 2015-04-17 DIAGNOSIS — J3089 Other allergic rhinitis: Secondary | ICD-10-CM | POA: Diagnosis not present

## 2015-04-25 DIAGNOSIS — J3089 Other allergic rhinitis: Secondary | ICD-10-CM | POA: Diagnosis not present

## 2015-04-25 DIAGNOSIS — J3081 Allergic rhinitis due to animal (cat) (dog) hair and dander: Secondary | ICD-10-CM | POA: Diagnosis not present

## 2015-04-25 DIAGNOSIS — J301 Allergic rhinitis due to pollen: Secondary | ICD-10-CM | POA: Diagnosis not present

## 2015-04-26 DIAGNOSIS — L821 Other seborrheic keratosis: Secondary | ICD-10-CM | POA: Diagnosis not present

## 2015-04-26 DIAGNOSIS — L570 Actinic keratosis: Secondary | ICD-10-CM | POA: Diagnosis not present

## 2015-04-26 DIAGNOSIS — L820 Inflamed seborrheic keratosis: Secondary | ICD-10-CM | POA: Diagnosis not present

## 2015-04-26 DIAGNOSIS — D485 Neoplasm of uncertain behavior of skin: Secondary | ICD-10-CM | POA: Diagnosis not present

## 2015-04-26 DIAGNOSIS — I788 Other diseases of capillaries: Secondary | ICD-10-CM | POA: Diagnosis not present

## 2015-04-26 DIAGNOSIS — Z85828 Personal history of other malignant neoplasm of skin: Secondary | ICD-10-CM | POA: Diagnosis not present

## 2015-05-02 DIAGNOSIS — J3089 Other allergic rhinitis: Secondary | ICD-10-CM | POA: Diagnosis not present

## 2015-05-02 DIAGNOSIS — J301 Allergic rhinitis due to pollen: Secondary | ICD-10-CM | POA: Diagnosis not present

## 2015-05-02 DIAGNOSIS — J3081 Allergic rhinitis due to animal (cat) (dog) hair and dander: Secondary | ICD-10-CM | POA: Diagnosis not present

## 2015-05-09 DIAGNOSIS — J301 Allergic rhinitis due to pollen: Secondary | ICD-10-CM | POA: Diagnosis not present

## 2015-05-09 DIAGNOSIS — J3089 Other allergic rhinitis: Secondary | ICD-10-CM | POA: Diagnosis not present

## 2015-05-09 DIAGNOSIS — J3081 Allergic rhinitis due to animal (cat) (dog) hair and dander: Secondary | ICD-10-CM | POA: Diagnosis not present

## 2015-05-16 DIAGNOSIS — J301 Allergic rhinitis due to pollen: Secondary | ICD-10-CM | POA: Diagnosis not present

## 2015-05-16 DIAGNOSIS — J3089 Other allergic rhinitis: Secondary | ICD-10-CM | POA: Diagnosis not present

## 2015-05-16 DIAGNOSIS — J3081 Allergic rhinitis due to animal (cat) (dog) hair and dander: Secondary | ICD-10-CM | POA: Diagnosis not present

## 2015-05-23 DIAGNOSIS — J3089 Other allergic rhinitis: Secondary | ICD-10-CM | POA: Diagnosis not present

## 2015-05-23 DIAGNOSIS — J301 Allergic rhinitis due to pollen: Secondary | ICD-10-CM | POA: Diagnosis not present

## 2015-05-30 DIAGNOSIS — J301 Allergic rhinitis due to pollen: Secondary | ICD-10-CM | POA: Diagnosis not present

## 2015-05-30 DIAGNOSIS — J3089 Other allergic rhinitis: Secondary | ICD-10-CM | POA: Diagnosis not present

## 2015-06-05 DIAGNOSIS — J3089 Other allergic rhinitis: Secondary | ICD-10-CM | POA: Diagnosis not present

## 2015-06-05 DIAGNOSIS — J301 Allergic rhinitis due to pollen: Secondary | ICD-10-CM | POA: Diagnosis not present

## 2015-06-05 DIAGNOSIS — J3081 Allergic rhinitis due to animal (cat) (dog) hair and dander: Secondary | ICD-10-CM | POA: Diagnosis not present

## 2015-06-20 DIAGNOSIS — Z961 Presence of intraocular lens: Secondary | ICD-10-CM | POA: Diagnosis not present

## 2015-06-20 DIAGNOSIS — J301 Allergic rhinitis due to pollen: Secondary | ICD-10-CM | POA: Diagnosis not present

## 2015-06-20 DIAGNOSIS — J3089 Other allergic rhinitis: Secondary | ICD-10-CM | POA: Diagnosis not present

## 2015-06-20 DIAGNOSIS — J3081 Allergic rhinitis due to animal (cat) (dog) hair and dander: Secondary | ICD-10-CM | POA: Diagnosis not present

## 2015-06-25 DIAGNOSIS — J3089 Other allergic rhinitis: Secondary | ICD-10-CM | POA: Diagnosis not present

## 2015-06-25 DIAGNOSIS — J301 Allergic rhinitis due to pollen: Secondary | ICD-10-CM | POA: Diagnosis not present

## 2015-06-26 DIAGNOSIS — J3081 Allergic rhinitis due to animal (cat) (dog) hair and dander: Secondary | ICD-10-CM | POA: Diagnosis not present

## 2015-06-26 DIAGNOSIS — J3089 Other allergic rhinitis: Secondary | ICD-10-CM | POA: Diagnosis not present

## 2015-06-26 DIAGNOSIS — J301 Allergic rhinitis due to pollen: Secondary | ICD-10-CM | POA: Diagnosis not present

## 2015-06-29 DIAGNOSIS — J3089 Other allergic rhinitis: Secondary | ICD-10-CM | POA: Diagnosis not present

## 2015-07-03 DIAGNOSIS — J3089 Other allergic rhinitis: Secondary | ICD-10-CM | POA: Diagnosis not present

## 2015-07-03 DIAGNOSIS — J301 Allergic rhinitis due to pollen: Secondary | ICD-10-CM | POA: Diagnosis not present

## 2015-07-03 DIAGNOSIS — J3081 Allergic rhinitis due to animal (cat) (dog) hair and dander: Secondary | ICD-10-CM | POA: Diagnosis not present

## 2015-07-10 DIAGNOSIS — J301 Allergic rhinitis due to pollen: Secondary | ICD-10-CM | POA: Diagnosis not present

## 2015-07-10 DIAGNOSIS — J3089 Other allergic rhinitis: Secondary | ICD-10-CM | POA: Diagnosis not present

## 2015-07-10 DIAGNOSIS — J3081 Allergic rhinitis due to animal (cat) (dog) hair and dander: Secondary | ICD-10-CM | POA: Diagnosis not present

## 2015-07-12 DIAGNOSIS — J3089 Other allergic rhinitis: Secondary | ICD-10-CM | POA: Diagnosis not present

## 2015-07-17 DIAGNOSIS — J3081 Allergic rhinitis due to animal (cat) (dog) hair and dander: Secondary | ICD-10-CM | POA: Diagnosis not present

## 2015-07-17 DIAGNOSIS — J301 Allergic rhinitis due to pollen: Secondary | ICD-10-CM | POA: Diagnosis not present

## 2015-07-17 DIAGNOSIS — J3089 Other allergic rhinitis: Secondary | ICD-10-CM | POA: Diagnosis not present

## 2015-08-01 DIAGNOSIS — J301 Allergic rhinitis due to pollen: Secondary | ICD-10-CM | POA: Diagnosis not present

## 2015-08-01 DIAGNOSIS — J3081 Allergic rhinitis due to animal (cat) (dog) hair and dander: Secondary | ICD-10-CM | POA: Diagnosis not present

## 2015-08-01 DIAGNOSIS — J3089 Other allergic rhinitis: Secondary | ICD-10-CM | POA: Diagnosis not present

## 2015-08-08 DIAGNOSIS — J3089 Other allergic rhinitis: Secondary | ICD-10-CM | POA: Diagnosis not present

## 2015-08-08 DIAGNOSIS — J301 Allergic rhinitis due to pollen: Secondary | ICD-10-CM | POA: Diagnosis not present

## 2015-08-15 DIAGNOSIS — J3081 Allergic rhinitis due to animal (cat) (dog) hair and dander: Secondary | ICD-10-CM | POA: Diagnosis not present

## 2015-08-15 DIAGNOSIS — J3089 Other allergic rhinitis: Secondary | ICD-10-CM | POA: Diagnosis not present

## 2015-08-15 DIAGNOSIS — J301 Allergic rhinitis due to pollen: Secondary | ICD-10-CM | POA: Diagnosis not present

## 2015-08-20 ENCOUNTER — Other Ambulatory Visit: Payer: Self-pay

## 2015-08-20 DIAGNOSIS — I10 Essential (primary) hypertension: Secondary | ICD-10-CM

## 2015-08-20 DIAGNOSIS — E785 Hyperlipidemia, unspecified: Secondary | ICD-10-CM

## 2015-08-22 ENCOUNTER — Other Ambulatory Visit: Payer: Medicare Other

## 2015-08-22 DIAGNOSIS — I10 Essential (primary) hypertension: Secondary | ICD-10-CM | POA: Diagnosis not present

## 2015-08-22 DIAGNOSIS — E785 Hyperlipidemia, unspecified: Secondary | ICD-10-CM

## 2015-08-22 DIAGNOSIS — J3089 Other allergic rhinitis: Secondary | ICD-10-CM | POA: Diagnosis not present

## 2015-08-22 DIAGNOSIS — J3081 Allergic rhinitis due to animal (cat) (dog) hair and dander: Secondary | ICD-10-CM | POA: Diagnosis not present

## 2015-08-22 DIAGNOSIS — J301 Allergic rhinitis due to pollen: Secondary | ICD-10-CM | POA: Diagnosis not present

## 2015-08-22 LAB — CBC WITH DIFFERENTIAL/PLATELET
BASOS ABS: 55 {cells}/uL (ref 0–200)
Basophils Relative: 1 %
EOS ABS: 165 {cells}/uL (ref 15–500)
EOS PCT: 3 %
HCT: 42.9 % (ref 35.0–45.0)
Hemoglobin: 14.4 g/dL (ref 11.7–15.5)
LYMPHS PCT: 39 %
Lymphs Abs: 2145 cells/uL (ref 850–3900)
MCH: 29.4 pg (ref 27.0–33.0)
MCHC: 33.6 g/dL (ref 32.0–36.0)
MCV: 87.7 fL (ref 80.0–100.0)
MONOS PCT: 6 %
MPV: 9.8 fL (ref 7.5–12.5)
Monocytes Absolute: 330 cells/uL (ref 200–950)
NEUTROS PCT: 51 %
Neutro Abs: 2805 cells/uL (ref 1500–7800)
PLATELETS: 247 10*3/uL (ref 140–400)
RBC: 4.89 MIL/uL (ref 3.80–5.10)
RDW: 13.5 % (ref 11.0–15.0)
WBC: 5.5 10*3/uL (ref 3.8–10.8)

## 2015-08-22 LAB — COMPREHENSIVE METABOLIC PANEL
ALK PHOS: 70 U/L (ref 33–130)
ALT: 17 U/L (ref 6–29)
AST: 20 U/L (ref 10–35)
Albumin: 3.9 g/dL (ref 3.6–5.1)
BILIRUBIN TOTAL: 0.5 mg/dL (ref 0.2–1.2)
BUN: 18 mg/dL (ref 7–25)
CALCIUM: 8.6 mg/dL (ref 8.6–10.4)
CO2: 29 mmol/L (ref 20–31)
CREATININE: 0.85 mg/dL (ref 0.60–0.93)
Chloride: 104 mmol/L (ref 98–110)
GLUCOSE: 86 mg/dL (ref 65–99)
Potassium: 4.4 mmol/L (ref 3.5–5.3)
SODIUM: 139 mmol/L (ref 135–146)
Total Protein: 6.3 g/dL (ref 6.1–8.1)

## 2015-08-22 LAB — LIPID PANEL
CHOLESTEROL: 212 mg/dL — AB (ref 125–200)
HDL: 59 mg/dL (ref >=46)
LDL Cholesterol: 122 mg/dL (ref <130)
Total CHOL/HDL Ratio: 3.6 Ratio (ref <=5.0)
Triglycerides: 157 mg/dL — ABNORMAL HIGH (ref <150)
VLDL: 31 mg/dL — ABNORMAL HIGH (ref <30)

## 2015-08-23 ENCOUNTER — Ambulatory Visit (INDEPENDENT_AMBULATORY_CARE_PROVIDER_SITE_OTHER): Payer: Medicare Other | Admitting: Nurse Practitioner

## 2015-08-23 ENCOUNTER — Encounter: Payer: Self-pay | Admitting: Nurse Practitioner

## 2015-08-23 VITALS — BP 130/80 | HR 88 | Temp 97.6°F | Resp 20 | Ht 65.0 in | Wt 170.8 lb

## 2015-08-23 DIAGNOSIS — F32A Depression, unspecified: Secondary | ICD-10-CM

## 2015-08-23 DIAGNOSIS — F329 Major depressive disorder, single episode, unspecified: Secondary | ICD-10-CM | POA: Diagnosis not present

## 2015-08-23 DIAGNOSIS — I10 Essential (primary) hypertension: Secondary | ICD-10-CM | POA: Diagnosis not present

## 2015-08-23 DIAGNOSIS — Z0000 Encounter for general adult medical examination without abnormal findings: Secondary | ICD-10-CM

## 2015-08-23 DIAGNOSIS — M8580 Other specified disorders of bone density and structure, unspecified site: Secondary | ICD-10-CM | POA: Diagnosis not present

## 2015-08-23 DIAGNOSIS — K219 Gastro-esophageal reflux disease without esophagitis: Secondary | ICD-10-CM

## 2015-08-23 DIAGNOSIS — E785 Hyperlipidemia, unspecified: Secondary | ICD-10-CM | POA: Diagnosis not present

## 2015-08-23 MED ORDER — BUPROPION HCL ER (XL) 150 MG PO TB24: 150.0000 mg | ORAL_TABLET | Freq: Every day | ORAL | Status: DC

## 2015-08-23 NOTE — Progress Notes (Signed)
Patient ID: [name], female   DOB: Oct 12, 1945, 70 y.o.   MRN: [id number]    PCP: Lauree Chandler, NP  Advanced Directive information Does patient have an advance directive?: Yes, Type of Advance Directive: [place];Living will;Out of facility DNR (pink MOST or yellow form), Does patient want to make changes to advanced directive?: No - Patient declined  Allergies  Allergen Reactions  . Morphine And Related Itching    Chief Complaint  Patient presents with  . Medical Management of Chronic Issues    Complete physical. labs printed. Passed clock drawing     HPI: Patient is a 70 y.o. female seen in the office today for annual exam. No major illnesses or hospitalizations in the last year Had cataract surgery in the last year  Feels like she is having depression. Can not get motivated to eat well or exercise. No energy. Feels like this depression is due to getting older. Feels sad. Has lost 7 friends in the last year   Worsening heartburn, feels like this is related to diet but can not figure out what exactly was causing it so started taking famotidine as needed. Feel like if she could get back on a diet she would do better but no motivation.  Husband also not doing well. Lots pain, he does not do anything around the house so she is doing everything.  Angry at him because he does nothing for himself.   Screenings: Colon Cancer- 2008, normal, to repeat in 10 years Breast Cancer- yearly  Cervical Cancer- normal PAP 2012, aged out Osteoporosis- Dexa Scan completed in 2015  Depression screening Depression screen [facility] 2/9 08/23/2015 08/21/2014 01/19/2014 12/14/2013 10/04/2012  Decreased Interest 0 0 0 0 0  Down, Depressed, Hopeless 3 0 0 0 0  PHQ - 2 Score 3 0 0 0 0  Altered sleeping 0 - - - -  Tired, decreased energy 3 - - - -  Change in appetite 3 - - - -  Feeling bad or failure about yourself  0 - - - -  Trouble concentrating 0 - - - -  Moving slowly  or fidgety/restless 0 - - - -  Suicidal thoughts 0 - - - -  PHQ-9 Score 9 - - - -  Difficult doing work/chores Somewhat difficult - - - -   Falls Fall Risk  08/23/2015 02/22/2015 01/23/2015 08/21/2014 01/19/2014  Falls in the past year? No No No No No   MMSE MMSE - Mini Mental State Exam 08/23/2015 08/21/2014  Orientation to time 5 5  Orientation to Place 5 5  Registration 3 3  Attention/ Calculation 5 5  Recall 3 3  Language- name 2 objects 2 2  Language- repeat 1 1  Language- follow 3 step command 3 3  Language- read & follow direction 1 1  Write a sentence 1 1  Copy design 1 1  Total score 30 30   Vaccines Immunization History  Administered Date(s) Administered  . Influenza-Unspecified 01/13/2013, 11/26/2013, 11/21/2014  . Pneumococcal Conjugate-13 08/21/2014  . Pneumococcal-Unspecified 05/30/2008, 02/11/2012  . Tdap 10/04/2012  . Zoster 04/21/2005    Smoking status: previous smoker, off and on for 18 years Alcohol use: occasionally  Dentist: routinely every 6 months Ophthalmologist: yearly   Exercise regimen: none currenly Diet: was on weight watchers but not currently  Functional Status of ADLs: independent of all ADLs Review of Systems:  Review of Systems  Constitutional: Negative for activity change, appetite change, fatigue and  unexpected weight change.  HENT: Negative for congestion and hearing loss.   Eyes: Negative.   Respiratory: Negative for cough and shortness of breath.   Cardiovascular: Negative for chest pain, palpitations and leg swelling.  Gastrointestinal: Negative for abdominal pain, diarrhea and constipation.  Genitourinary: Negative for dysuria and difficulty urinating. Vaginal discharge: generalized aches   Musculoskeletal: Positive for arthralgias. Negative for myalgias.  Skin: Negative for color change and wound.  Allergic/Immunologic: Positive for environmental allergies.  Neurological: Negative for dizziness and weakness.    Psychiatric/Behavioral: Negative for behavioral problems, confusion, self-injury and agitation.       Depression, lack of motivation  No SI or HI    Past Medical History  Diagnosis Date  . Hypertension     been resolved >1 year.  . Hyperlipidemia   . Pain in joint, ankle and foot   . Contact dermatitis and other eczema, due to unspecified cause   . Other and unspecified hyperlipidemia   . Encounter for long-term (current) use of other medications   . Screening for malignant neoplasm of the cervix    Past Surgical History  Procedure Laterality Date  . 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 Ankle surgery  05/2006    Right Ankle  . Tonsillectomy      @ 70 y.o.  . Diagnostic laparoscopy  1980  . Tubal ligation  1980  . Rhinoplasty  1969  . Carpel tunnel release  [id number]    [id number]), Left(2006)  . Knee arthroplasty  05/2007    Left  . Total ankle arthroplasty  10/30/2011    Procedure: TOTAL ANKLE ARTHOPLASTY;  Surgeon: Wylene Simmer, MD;  Location: [place];  Service: Orthopedics;  Laterality: Right;  RIGHT TOTAL ANKLE REPLACEMENT, POSSIBLE GASTROC RESESSION  . Cataract extraction Left 10/03/14  . Cataract extraction Right 11/16/2014   Social History:   reports that she quit smoking about 35 years ago. Her smoking use included Cigarettes. She has a 16 pack-year smoking history. She has never used smokeless tobacco. She reports that she drinks alcohol. She reports that she does not use illicit drugs.  Family History  Problem Relation Age of Onset  . Cancer Mother 66    breast    Medications: Patient's Medications  New Prescriptions   No medications on file  Previous Medications   ASCORBIC ACID (VITAMIN C) 1000 MG TABLET    Take 1,000 mg by mouth daily.   ASPIRIN EC 81 MG TABLET    Take 81 mg by mouth daily.   B COMPLEX VITAMINS (VITAMIN B-COMPLEX) TABS    Take 1 tablet by mouth daily.    CALCIUM CARB-CHOLECALCIFEROL (CALCIUM 1000 + D PO)    Take 1 tablet by mouth  daily.    CHOLECALCIFEROL (VITAMIN D) 1000 UNITS TABLET    Take 1 tablet (1,000 Units total) by mouth daily.   CONJUGATED ESTROGENS (PREMARIN) VAGINAL CREAM    administer 0.5 g vaginally 3 x a week   DESLORATADINE (CLARINEX) 5 MG TABLET    Take 5 mg by mouth daily.   FAMOTIDINE (PEPCID) 20 MG TABLET    Take 20 mg by mouth daily as needed for heartburn or indigestion.   FLUTICASONE (FLONASE) 50 MCG/ACT NASAL SPRAY    Place 1 spray into both nostrils daily.   MONTELUKAST (SINGULAIR) 10 MG TABLET    Take 10 mg by mouth at bedtime.   MULTIPLE VITAMIN (MULTIVITAMIN WITH MINERALS) TABS    Take 1 tablet by mouth  daily.  Modified Medications   No medications on file  Discontinued Medications   No medications on file     Physical Exam:  Filed Vitals:   08/23/15 1337  BP: 130/80  Pulse: 88  Temp: 97.6 F (36.4 C)  TempSrc: Oral  Resp: 20  Height: 5\' 5"  (1.651 m)  Weight: 170 lb 12.8 oz (77.474 kg)  SpO2: 95%   Body mass index is 28.42 kg/(m^2).  Physical Exam  Constitutional: She is oriented to person, place, and time. She appears well-developed and well-nourished. No distress.  HENT:  Head: Normocephalic and atraumatic.  Right Ear: External ear normal.  Left Ear: External ear normal.  Nose: Nose normal.  Mouth/Throat: Oropharynx is clear and moist. No oropharyngeal exudate.  Eyes: Conjunctivae and EOM are normal. Pupils are equal, round, and reactive to light.  Neck: Normal range of motion. Neck supple. No JVD present. No tracheal deviation present. No thyromegaly present.  Cardiovascular: Normal rate, regular rhythm, normal heart sounds and intact distal pulses.   Pulmonary/Chest: Effort normal and breath sounds normal. No respiratory distress. Right breast exhibits no inverted nipple, no mass, no nipple discharge, no skin change and no tenderness. Left breast exhibits no inverted nipple, no mass, no nipple discharge, no skin change and no tenderness.  Abdominal: Soft. Bowel sounds  are normal. She exhibits no distension and no mass. There is no tenderness.  Musculoskeletal: Normal range of motion. She exhibits no tenderness.  Right ankle swelling around lateral malleolus  Neurological: She is alert and oriented to person, place, and time. She has normal reflexes. No cranial nerve deficit.  Skin: Skin is warm and dry.  Scar on right ankle and foot  Psychiatric: She has a normal mood and affect. Her behavior is normal. Judgment and thought content normal.    Labs reviewed: Basic Metabolic Panel:  Recent Labs  08/22/15 0908  NA 139  K 4.4  CL 104  CO2 29  GLUCOSE 86  BUN 18  CREATININE 0.85  CALCIUM 8.6   Liver Function Tests:  Recent Labs  08/22/15 0908  AST 20  ALT 17  ALKPHOS 70  BILITOT 0.5  PROT 6.3  ALBUMIN 3.9   No results for input(s): LIPASE, AMYLASE in the last 8760 hours. No results for input(s): AMMONIA in the last 8760 hours. CBC:  Recent Labs  08/22/15 0908  WBC 5.5  NEUTROABS 2805  HGB 14.4  HCT 42.9  MCV 87.7  PLT 247   Lipid Panel:  Recent Labs  08/22/15 0908  CHOL 212*  HDL 59  LDLCALC 122  TRIG 157*  CHOLHDL 3.6   TSH: No results for input(s): TSH in the last 8760 hours. A1C: No results found for: HGBA1C   Assessment/Plan 1. Encounter for Medicare annual wellness exam -depression screening positive, see number 2 -encouraged exercise 30 mins/5 days a week -encourage dietary changes, heart healthy -due to colonoscopy next year -gets mammograms yearly  2. Depression -worsening depression due to failing health of friends and family and deaths of multiple people close to her.  -no SI or HI - buPROPion (WELLBUTRIN XL) 150 MG 24 hr tablet; Take 1 tablet (150 mg total) by mouth daily.  Dispense: 30 tablet; Refill: 1, discussed Side effects of medication or if she starts having Hi or Si to stop medication immediately, notify and to seek help  3. Hyperlipidemia LDL worse, pt has not been maintaining diet,  encouraged dietary changes and to increase exercise at this time  4. Essential  hypertension -stable, no currently on medication, encouraged dietary compliance.   5. Osteopenia Follow up bone density this year conts on cal and vit d  6. Gastroesophageal reflux disease without esophagitis Worse however reports this is mostly due to diet, conts on pepcid as needed  Follow up in 1 month on depression, sooner if needed  Jessica K. Harle Battiest  [facility] [phone] 8 am - 5 pm) [phone] (after hours)

## 2015-08-23 NOTE — Patient Instructions (Signed)
To start Wellbutrin 150 mg daily  Follow up in 1 month  Try to eat better and get 30 mins of exercise 5 days a week-- start by walking daily

## 2015-08-29 DIAGNOSIS — J301 Allergic rhinitis due to pollen: Secondary | ICD-10-CM | POA: Diagnosis not present

## 2015-08-29 DIAGNOSIS — J3089 Other allergic rhinitis: Secondary | ICD-10-CM | POA: Diagnosis not present

## 2015-08-29 DIAGNOSIS — J3081 Allergic rhinitis due to animal (cat) (dog) hair and dander: Secondary | ICD-10-CM | POA: Diagnosis not present

## 2015-09-04 DIAGNOSIS — J301 Allergic rhinitis due to pollen: Secondary | ICD-10-CM | POA: Diagnosis not present

## 2015-09-04 DIAGNOSIS — J3081 Allergic rhinitis due to animal (cat) (dog) hair and dander: Secondary | ICD-10-CM | POA: Diagnosis not present

## 2015-09-04 DIAGNOSIS — J3089 Other allergic rhinitis: Secondary | ICD-10-CM | POA: Diagnosis not present

## 2015-09-11 DIAGNOSIS — J3089 Other allergic rhinitis: Secondary | ICD-10-CM | POA: Diagnosis not present

## 2015-09-11 DIAGNOSIS — J301 Allergic rhinitis due to pollen: Secondary | ICD-10-CM | POA: Diagnosis not present

## 2015-09-11 DIAGNOSIS — J3081 Allergic rhinitis due to animal (cat) (dog) hair and dander: Secondary | ICD-10-CM | POA: Diagnosis not present

## 2015-09-13 DIAGNOSIS — D225 Melanocytic nevi of trunk: Secondary | ICD-10-CM | POA: Diagnosis not present

## 2015-09-13 DIAGNOSIS — L821 Other seborrheic keratosis: Secondary | ICD-10-CM | POA: Diagnosis not present

## 2015-09-13 DIAGNOSIS — D1801 Hemangioma of skin and subcutaneous tissue: Secondary | ICD-10-CM | POA: Diagnosis not present

## 2015-09-13 DIAGNOSIS — L820 Inflamed seborrheic keratosis: Secondary | ICD-10-CM | POA: Diagnosis not present

## 2015-09-13 DIAGNOSIS — D2261 Melanocytic nevi of right upper limb, including shoulder: Secondary | ICD-10-CM | POA: Diagnosis not present

## 2015-09-13 DIAGNOSIS — Z85828 Personal history of other malignant neoplasm of skin: Secondary | ICD-10-CM | POA: Diagnosis not present

## 2015-09-13 DIAGNOSIS — L814 Other melanin hyperpigmentation: Secondary | ICD-10-CM | POA: Diagnosis not present

## 2015-09-18 DIAGNOSIS — J3089 Other allergic rhinitis: Secondary | ICD-10-CM | POA: Diagnosis not present

## 2015-09-18 DIAGNOSIS — J301 Allergic rhinitis due to pollen: Secondary | ICD-10-CM | POA: Diagnosis not present

## 2015-09-18 DIAGNOSIS — J3081 Allergic rhinitis due to animal (cat) (dog) hair and dander: Secondary | ICD-10-CM | POA: Diagnosis not present

## 2015-09-24 ENCOUNTER — Encounter: Payer: Self-pay | Admitting: Nurse Practitioner

## 2015-09-24 ENCOUNTER — Ambulatory Visit (INDEPENDENT_AMBULATORY_CARE_PROVIDER_SITE_OTHER): Payer: Medicare Other | Admitting: Nurse Practitioner

## 2015-09-24 VITALS — BP 138/94 | HR 73 | Temp 98.0°F | Resp 17 | Ht 65.0 in | Wt 165.8 lb

## 2015-09-24 DIAGNOSIS — I10 Essential (primary) hypertension: Secondary | ICD-10-CM | POA: Diagnosis not present

## 2015-09-24 DIAGNOSIS — F329 Major depressive disorder, single episode, unspecified: Secondary | ICD-10-CM

## 2015-09-24 DIAGNOSIS — F32A Depression, unspecified: Secondary | ICD-10-CM

## 2015-09-24 DIAGNOSIS — E559 Vitamin D deficiency, unspecified: Secondary | ICD-10-CM

## 2015-09-24 LAB — TSH: TSH: 1.84 m[IU]/L

## 2015-09-24 MED ORDER — BUPROPION HCL ER (XL) 150 MG PO TB24: 150.0000 mg | ORAL_TABLET | Freq: Every day | ORAL | 6 refills | Status: DC

## 2015-09-24 NOTE — Progress Notes (Signed)
Careteam: Patient Care Team: Lauree Chandler, NP as PCP - General (Nurse Practitioner)  Advanced Directive information Does patient have an advance directive?: Yes, Type of Advance Directive: Healthcare Power of [place];Living will;Out of facility DNR (pink MOST or yellow form)  Allergies  Allergen Reactions  . Morphine And Related Itching    Chief Complaint  Patient presents with  . Medical Management of Chronic Issues    1 month follow up for depression. Labs printed     HPI: Patient is a 70 y.o. female seen in the office today for follow up on depression. Blood pressure high today however pt not on blood pressure medication. Takes blood pressure at home and never above 130/90 at home.  Possible ate something salty.   Been on Wellbutrin for 4 weeks and reports her mood is a little better Went through and read all the side effects and has not had any. Keeping her up longer at night but she was going to bed really early   Has lost some weight which she was trying to.   Pt reports she has had a history of vit d def. GYN was following but he has retired and has not had it followed since. On supplements.   Review of Systems:  Review of Systems  Constitutional: Negative for activity change, appetite change, fatigue and unexpected weight change.  Eyes: Negative.   Respiratory: Negative for cough and shortness of breath.   Cardiovascular: Negative for chest pain, palpitations and leg swelling.  Gastrointestinal: Negative for constipation and diarrhea.  Genitourinary: Negative for difficulty urinating and dysuria.  Musculoskeletal: Positive for arthralgias (knee pain). Negative for myalgias.  Skin: Negative for color change and wound.  Neurological: Negative for dizziness and weakness.  Psychiatric/Behavioral: Negative for agitation, behavioral problems and confusion.       Depression improved on Wellbutrin     Past Medical History:  Diagnosis Date  . Contact  dermatitis and other eczema, due to unspecified cause   . Encounter for long-term (current) use of other medications   . Hyperlipidemia   . Hypertension    been resolved >1 year.  . Other and unspecified hyperlipidemia   . Pain in joint, ankle and foot   . Screening for malignant neoplasm of the cervix    Past Surgical History:  Procedure Laterality Date  . ANKLE SURGERY  05/2006   Right Ankle  . Carpel TUnnel Release  [id number]   [id number]), Left(2006)  . CATARACT EXTRACTION Left 10/03/14  . CATARACT EXTRACTION Right 11/16/2014  . DIAGNOSTIC LAPAROSCOPY  1980  . JOINT REPLACEMENT     Total Knee(L)-11/05/07  . KNEE ARTHROPLASTY  05/2007   Left  . RHINOPLASTY  1969  . TONSILLECTOMY     @ 70 y.o.  . TOTAL ANKLE ARTHROPLASTY  10/30/2011   Procedure: TOTAL ANKLE ARTHOPLASTY;  Surgeon: Wylene Simmer, MD;  Location: [place];  Service: Orthopedics;  Laterality: Right;  RIGHT TOTAL ANKLE REPLACEMENT, POSSIBLE GASTROC RESESSION  . TUBAL LIGATION  1980   Social History:   reports that she quit smoking about 35 years ago. Her smoking use included Cigarettes. She has a 16.00 pack-year smoking history. She has never used smokeless tobacco. She reports that she drinks alcohol. She reports that she does not use drugs.  Family History  Problem Relation Age of Onset  . Cancer Mother 22    breast    Medications: Patient's Medications  New Prescriptions   No medications on file  Previous Medications  ASCORBIC ACID (VITAMIN C) 1000 MG TABLET    Take 1,000 mg by mouth daily.   ASPIRIN EC 81 MG TABLET    Take 81 mg by mouth daily.   B COMPLEX VITAMINS (VITAMIN B-COMPLEX) TABS    Take 1 tablet by mouth daily.    BUPROPION (WELLBUTRIN XL) 150 MG 24 HR TABLET    Take 1 tablet (150 mg total) by mouth daily.   CALCIUM CARB-CHOLECALCIFEROL (CALCIUM 1000 + D PO)    Take 1 tablet by mouth daily.    CHOLECALCIFEROL (VITAMIN D) 1000 UNITS TABLET    Take 1 tablet (1,000 Units total) by mouth daily.    CONJUGATED ESTROGENS (PREMARIN) VAGINAL CREAM    administer 0.5 g vaginally 3 x a week   DESLORATADINE (CLARINEX) 5 MG TABLET    Take 5 mg by mouth daily.   FAMOTIDINE (PEPCID) 20 MG TABLET    Take 20 mg by mouth daily as needed for heartburn or indigestion.   FLUTICASONE (FLONASE) 50 MCG/ACT NASAL SPRAY    Place 1 spray into both nostrils daily.   MONTELUKAST (SINGULAIR) 10 MG TABLET    Take 10 mg by mouth at bedtime.   MULTIPLE VITAMIN (MULTIVITAMIN WITH MINERALS) TABS    Take 1 tablet by mouth daily.   NAPROXEN SODIUM (ANAPROX) 220 MG TABLET    Take 220 mg by mouth daily as needed.  Modified Medications   No medications on file  Discontinued Medications   No medications on file     Physical Exam:  Vitals:   09/24/15 1023  BP: (!) 138/94  Pulse: 73  Resp: 17  Temp: 98 F (36.7 C)  TempSrc: Oral  SpO2: 95%  Weight: 165 lb 12.8 oz (75.2 kg)  Height: 5\' 5"  (1.651 m)   Body mass index is 27.59 kg/m.  Physical Exam  Constitutional: She is oriented to person, place, and time. She appears well-developed and well-nourished. No distress.  HENT:  Head: Normocephalic and atraumatic.  Mouth/Throat: Oropharynx is clear and moist. No oropharyngeal exudate.  Eyes: Conjunctivae are normal. Pupils are equal, round, and reactive to light.  Neck: Normal range of motion. Neck supple.  Cardiovascular: Normal rate, regular rhythm and normal heart sounds.   Pulmonary/Chest: Effort normal and breath sounds normal.  Neurological: She is alert and oriented to person, place, and time.  Skin: Skin is warm and dry. She is not diaphoretic.  Psychiatric: She has a normal mood and affect.    Labs reviewed: Basic Metabolic Panel:  Recent Labs  08/22/15 0908  NA 139  K 4.4  CL 104  CO2 29  GLUCOSE 86  BUN 18  CREATININE 0.85  CALCIUM 8.6   Liver Function Tests:  Recent Labs  08/22/15 0908  AST 20  ALT 17  ALKPHOS 70  BILITOT 0.5  PROT 6.3  ALBUMIN 3.9   No results for  input(s): LIPASE, AMYLASE in the last 8760 hours. No results for input(s): AMMONIA in the last 8760 hours. CBC:  Recent Labs  08/22/15 0908  WBC 5.5  NEUTROABS 2,805  HGB 14.4  HCT 42.9  MCV 87.7  PLT 247   Lipid Panel:  Recent Labs  08/22/15 0908  CHOL 212*  HDL 59  LDLCALC 122  TRIG 157*  CHOLHDL 3.6   TSH: No results for input(s): TSH in the last 8760 hours. A1C: No results found for: HGBA1C   Assessment/Plan 1. Depression -improved on Wellbutrin, encouraged therapy in combination with medication -will check  Vitamin  D, 25-hydroxy and  TSH level - buPROPion (WELLBUTRIN XL) 150 MG 24 hr tablet; Take 1 tablet (150 mg total) by mouth daily.  Dispense: 30 tablet; Refill: 6  2. Vitamin D deficiency -cont on supplements, will follow up Vit D level - Vitamin D, 25-hydroxy  3. Essential hypertension -slightly elevated today but most likely to diet yesterday, pt monitoring blood pressure at home and at goal -cont lifestyle modification  To follow up in 5 months, sooner if needed due to depression  Jessica K. Harle Battiest  [facility] [phone] 8 am - 5 pm) [phone] (after hours)

## 2015-09-24 NOTE — Patient Instructions (Signed)
Follow up in 5 month for routine follow up, unless you are feeling like your depression is not controlled then follow up sooner

## 2015-09-25 DIAGNOSIS — J3089 Other allergic rhinitis: Secondary | ICD-10-CM | POA: Diagnosis not present

## 2015-09-25 DIAGNOSIS — J3081 Allergic rhinitis due to animal (cat) (dog) hair and dander: Secondary | ICD-10-CM | POA: Diagnosis not present

## 2015-09-25 DIAGNOSIS — J301 Allergic rhinitis due to pollen: Secondary | ICD-10-CM | POA: Diagnosis not present

## 2015-09-25 LAB — VITAMIN D 25 HYDROXY (VIT D DEFICIENCY, FRACTURES): VIT D 25 HYDROXY: 51 ng/mL (ref 30–100)

## 2015-10-02 DIAGNOSIS — M8589 Other specified disorders of bone density and structure, multiple sites: Secondary | ICD-10-CM | POA: Diagnosis not present

## 2015-10-02 LAB — HM DEXA SCAN

## 2015-10-03 DIAGNOSIS — J3089 Other allergic rhinitis: Secondary | ICD-10-CM | POA: Diagnosis not present

## 2015-10-03 DIAGNOSIS — J301 Allergic rhinitis due to pollen: Secondary | ICD-10-CM | POA: Diagnosis not present

## 2015-10-03 DIAGNOSIS — J3081 Allergic rhinitis due to animal (cat) (dog) hair and dander: Secondary | ICD-10-CM | POA: Diagnosis not present

## 2015-10-05 ENCOUNTER — Other Ambulatory Visit: Payer: Self-pay | Admitting: Nurse Practitioner

## 2015-10-05 DIAGNOSIS — F32A Depression, unspecified: Secondary | ICD-10-CM

## 2015-10-05 DIAGNOSIS — F329 Major depressive disorder, single episode, unspecified: Secondary | ICD-10-CM

## 2015-10-08 DIAGNOSIS — J3081 Allergic rhinitis due to animal (cat) (dog) hair and dander: Secondary | ICD-10-CM | POA: Diagnosis not present

## 2015-10-08 DIAGNOSIS — J3089 Other allergic rhinitis: Secondary | ICD-10-CM | POA: Diagnosis not present

## 2015-10-08 DIAGNOSIS — J301 Allergic rhinitis due to pollen: Secondary | ICD-10-CM | POA: Diagnosis not present

## 2015-10-09 ENCOUNTER — Other Ambulatory Visit: Payer: Self-pay | Admitting: Nurse Practitioner

## 2015-10-09 DIAGNOSIS — Z1231 Encounter for screening mammogram for malignant neoplasm of breast: Secondary | ICD-10-CM

## 2015-10-09 DIAGNOSIS — J3081 Allergic rhinitis due to animal (cat) (dog) hair and dander: Secondary | ICD-10-CM | POA: Diagnosis not present

## 2015-10-09 DIAGNOSIS — J301 Allergic rhinitis due to pollen: Secondary | ICD-10-CM | POA: Diagnosis not present

## 2015-10-09 DIAGNOSIS — J3089 Other allergic rhinitis: Secondary | ICD-10-CM | POA: Diagnosis not present

## 2015-10-24 DIAGNOSIS — J3081 Allergic rhinitis due to animal (cat) (dog) hair and dander: Secondary | ICD-10-CM | POA: Diagnosis not present

## 2015-10-24 DIAGNOSIS — J3089 Other allergic rhinitis: Secondary | ICD-10-CM | POA: Diagnosis not present

## 2015-10-24 DIAGNOSIS — J301 Allergic rhinitis due to pollen: Secondary | ICD-10-CM | POA: Diagnosis not present

## 2015-10-26 DIAGNOSIS — Z96661 Presence of right artificial ankle joint: Secondary | ICD-10-CM | POA: Diagnosis not present

## 2015-10-26 DIAGNOSIS — M76822 Posterior tibial tendinitis, left leg: Secondary | ICD-10-CM | POA: Diagnosis not present

## 2015-10-30 DIAGNOSIS — J3081 Allergic rhinitis due to animal (cat) (dog) hair and dander: Secondary | ICD-10-CM | POA: Diagnosis not present

## 2015-10-30 DIAGNOSIS — J301 Allergic rhinitis due to pollen: Secondary | ICD-10-CM | POA: Diagnosis not present

## 2015-10-30 DIAGNOSIS — J3089 Other allergic rhinitis: Secondary | ICD-10-CM | POA: Diagnosis not present

## 2015-11-02 DIAGNOSIS — M7502 Adhesive capsulitis of left shoulder: Secondary | ICD-10-CM | POA: Diagnosis not present

## 2015-11-06 DIAGNOSIS — J3081 Allergic rhinitis due to animal (cat) (dog) hair and dander: Secondary | ICD-10-CM | POA: Diagnosis not present

## 2015-11-06 DIAGNOSIS — J3089 Other allergic rhinitis: Secondary | ICD-10-CM | POA: Diagnosis not present

## 2015-11-06 DIAGNOSIS — J301 Allergic rhinitis due to pollen: Secondary | ICD-10-CM | POA: Diagnosis not present

## 2015-11-07 DIAGNOSIS — M76821 Posterior tibial tendinitis, right leg: Secondary | ICD-10-CM | POA: Diagnosis not present

## 2015-11-12 DIAGNOSIS — M7502 Adhesive capsulitis of left shoulder: Secondary | ICD-10-CM | POA: Diagnosis not present

## 2015-11-13 DIAGNOSIS — J3081 Allergic rhinitis due to animal (cat) (dog) hair and dander: Secondary | ICD-10-CM | POA: Diagnosis not present

## 2015-11-13 DIAGNOSIS — J301 Allergic rhinitis due to pollen: Secondary | ICD-10-CM | POA: Diagnosis not present

## 2015-11-13 DIAGNOSIS — J3089 Other allergic rhinitis: Secondary | ICD-10-CM | POA: Diagnosis not present

## 2015-11-14 ENCOUNTER — Ambulatory Visit
Admission: RE | Admit: 2015-11-14 | Discharge: 2015-11-14 | Disposition: A | Discharge status: Home / Self Care | Payer: Medicare Other | Source: Ambulatory Visit | Attending: Nurse Practitioner | Admitting: Nurse Practitioner

## 2015-11-14 DIAGNOSIS — Z1231 Encounter for screening mammogram for malignant neoplasm of breast: Secondary | ICD-10-CM | POA: Diagnosis not present

## 2015-11-15 DIAGNOSIS — M7502 Adhesive capsulitis of left shoulder: Secondary | ICD-10-CM | POA: Diagnosis not present

## 2015-11-19 DIAGNOSIS — M7502 Adhesive capsulitis of left shoulder: Secondary | ICD-10-CM | POA: Diagnosis not present

## 2015-11-20 DIAGNOSIS — J301 Allergic rhinitis due to pollen: Secondary | ICD-10-CM | POA: Diagnosis not present

## 2015-11-20 DIAGNOSIS — J3081 Allergic rhinitis due to animal (cat) (dog) hair and dander: Secondary | ICD-10-CM | POA: Diagnosis not present

## 2015-11-20 DIAGNOSIS — J3089 Other allergic rhinitis: Secondary | ICD-10-CM | POA: Diagnosis not present

## 2015-11-22 DIAGNOSIS — M7502 Adhesive capsulitis of left shoulder: Secondary | ICD-10-CM | POA: Diagnosis not present

## 2015-11-27 DIAGNOSIS — Z23 Encounter for immunization: Secondary | ICD-10-CM | POA: Diagnosis not present

## 2015-11-27 DIAGNOSIS — J301 Allergic rhinitis due to pollen: Secondary | ICD-10-CM | POA: Diagnosis not present

## 2015-11-27 DIAGNOSIS — J3089 Other allergic rhinitis: Secondary | ICD-10-CM | POA: Diagnosis not present

## 2015-11-27 DIAGNOSIS — J3081 Allergic rhinitis due to animal (cat) (dog) hair and dander: Secondary | ICD-10-CM | POA: Diagnosis not present

## 2015-11-28 DIAGNOSIS — M7502 Adhesive capsulitis of left shoulder: Secondary | ICD-10-CM | POA: Diagnosis not present

## 2015-11-29 DIAGNOSIS — M7502 Adhesive capsulitis of left shoulder: Secondary | ICD-10-CM | POA: Diagnosis not present

## 2015-12-04 DIAGNOSIS — J3089 Other allergic rhinitis: Secondary | ICD-10-CM | POA: Diagnosis not present

## 2015-12-04 DIAGNOSIS — J3081 Allergic rhinitis due to animal (cat) (dog) hair and dander: Secondary | ICD-10-CM | POA: Diagnosis not present

## 2015-12-04 DIAGNOSIS — J301 Allergic rhinitis due to pollen: Secondary | ICD-10-CM | POA: Diagnosis not present

## 2015-12-11 DIAGNOSIS — J301 Allergic rhinitis due to pollen: Secondary | ICD-10-CM | POA: Diagnosis not present

## 2015-12-11 DIAGNOSIS — J3089 Other allergic rhinitis: Secondary | ICD-10-CM | POA: Diagnosis not present

## 2015-12-11 DIAGNOSIS — J3081 Allergic rhinitis due to animal (cat) (dog) hair and dander: Secondary | ICD-10-CM | POA: Diagnosis not present

## 2015-12-18 DIAGNOSIS — J301 Allergic rhinitis due to pollen: Secondary | ICD-10-CM | POA: Diagnosis not present

## 2015-12-18 DIAGNOSIS — J3089 Other allergic rhinitis: Secondary | ICD-10-CM | POA: Diagnosis not present

## 2015-12-18 DIAGNOSIS — J3081 Allergic rhinitis due to animal (cat) (dog) hair and dander: Secondary | ICD-10-CM | POA: Diagnosis not present

## 2015-12-25 DIAGNOSIS — J3089 Other allergic rhinitis: Secondary | ICD-10-CM | POA: Diagnosis not present

## 2015-12-25 DIAGNOSIS — J3081 Allergic rhinitis due to animal (cat) (dog) hair and dander: Secondary | ICD-10-CM | POA: Diagnosis not present

## 2015-12-25 DIAGNOSIS — J301 Allergic rhinitis due to pollen: Secondary | ICD-10-CM | POA: Diagnosis not present

## 2015-12-31 DIAGNOSIS — J3089 Other allergic rhinitis: Secondary | ICD-10-CM | POA: Diagnosis not present

## 2015-12-31 DIAGNOSIS — J301 Allergic rhinitis due to pollen: Secondary | ICD-10-CM | POA: Diagnosis not present

## 2015-12-31 DIAGNOSIS — J3081 Allergic rhinitis due to animal (cat) (dog) hair and dander: Secondary | ICD-10-CM | POA: Diagnosis not present

## 2016-01-08 DIAGNOSIS — J301 Allergic rhinitis due to pollen: Secondary | ICD-10-CM | POA: Diagnosis not present

## 2016-01-08 DIAGNOSIS — J3081 Allergic rhinitis due to animal (cat) (dog) hair and dander: Secondary | ICD-10-CM | POA: Diagnosis not present

## 2016-01-08 DIAGNOSIS — J3089 Other allergic rhinitis: Secondary | ICD-10-CM | POA: Diagnosis not present

## 2016-01-11 ENCOUNTER — Encounter: Payer: Self-pay | Admitting: Internal Medicine

## 2016-01-15 DIAGNOSIS — J301 Allergic rhinitis due to pollen: Secondary | ICD-10-CM | POA: Diagnosis not present

## 2016-01-15 DIAGNOSIS — J3089 Other allergic rhinitis: Secondary | ICD-10-CM | POA: Diagnosis not present

## 2016-01-15 DIAGNOSIS — J3081 Allergic rhinitis due to animal (cat) (dog) hair and dander: Secondary | ICD-10-CM | POA: Diagnosis not present

## 2016-01-22 DIAGNOSIS — J301 Allergic rhinitis due to pollen: Secondary | ICD-10-CM | POA: Diagnosis not present

## 2016-01-22 DIAGNOSIS — J3089 Other allergic rhinitis: Secondary | ICD-10-CM | POA: Diagnosis not present

## 2016-01-22 DIAGNOSIS — J3081 Allergic rhinitis due to animal (cat) (dog) hair and dander: Secondary | ICD-10-CM | POA: Diagnosis not present

## 2016-01-23 ENCOUNTER — Telehealth: Payer: Self-pay | Admitting: Nurse Practitioner

## 2016-01-23 NOTE — Telephone Encounter (Signed)
left msg asking pt to confirm this AWV appt w/ nurse and EV w/ Jessica. VDM (DD) °

## 2016-01-29 DIAGNOSIS — J3081 Allergic rhinitis due to animal (cat) (dog) hair and dander: Secondary | ICD-10-CM | POA: Diagnosis not present

## 2016-01-29 DIAGNOSIS — J3089 Other allergic rhinitis: Secondary | ICD-10-CM | POA: Diagnosis not present

## 2016-01-29 DIAGNOSIS — J301 Allergic rhinitis due to pollen: Secondary | ICD-10-CM | POA: Diagnosis not present

## 2016-02-06 DIAGNOSIS — J301 Allergic rhinitis due to pollen: Secondary | ICD-10-CM | POA: Diagnosis not present

## 2016-02-06 DIAGNOSIS — J3089 Other allergic rhinitis: Secondary | ICD-10-CM | POA: Diagnosis not present

## 2016-02-06 DIAGNOSIS — J3081 Allergic rhinitis due to animal (cat) (dog) hair and dander: Secondary | ICD-10-CM | POA: Diagnosis not present

## 2016-02-12 DIAGNOSIS — J301 Allergic rhinitis due to pollen: Secondary | ICD-10-CM | POA: Diagnosis not present

## 2016-02-12 DIAGNOSIS — J3089 Other allergic rhinitis: Secondary | ICD-10-CM | POA: Diagnosis not present

## 2016-02-12 DIAGNOSIS — J3081 Allergic rhinitis due to animal (cat) (dog) hair and dander: Secondary | ICD-10-CM | POA: Diagnosis not present

## 2016-02-19 DIAGNOSIS — J301 Allergic rhinitis due to pollen: Secondary | ICD-10-CM | POA: Diagnosis not present

## 2016-02-19 DIAGNOSIS — J3081 Allergic rhinitis due to animal (cat) (dog) hair and dander: Secondary | ICD-10-CM | POA: Diagnosis not present

## 2016-02-19 DIAGNOSIS — J3089 Other allergic rhinitis: Secondary | ICD-10-CM | POA: Diagnosis not present

## 2016-02-25 ENCOUNTER — Encounter: Payer: Self-pay | Admitting: Nurse Practitioner

## 2016-02-25 ENCOUNTER — Ambulatory Visit (INDEPENDENT_AMBULATORY_CARE_PROVIDER_SITE_OTHER): Payer: Medicare Other | Admitting: Nurse Practitioner

## 2016-02-25 VITALS — BP 132/84 | HR 70 | Temp 97.8°F | Resp 18 | Ht 65.0 in | Wt 162.4 lb

## 2016-02-25 DIAGNOSIS — J3081 Allergic rhinitis due to animal (cat) (dog) hair and dander: Secondary | ICD-10-CM | POA: Diagnosis not present

## 2016-02-25 DIAGNOSIS — F32A Depression, unspecified: Secondary | ICD-10-CM

## 2016-02-25 DIAGNOSIS — E785 Hyperlipidemia, unspecified: Secondary | ICD-10-CM | POA: Diagnosis not present

## 2016-02-25 DIAGNOSIS — K219 Gastro-esophageal reflux disease without esophagitis: Secondary | ICD-10-CM

## 2016-02-25 DIAGNOSIS — M159 Polyosteoarthritis, unspecified: Secondary | ICD-10-CM | POA: Diagnosis not present

## 2016-02-25 DIAGNOSIS — J301 Allergic rhinitis due to pollen: Secondary | ICD-10-CM | POA: Diagnosis not present

## 2016-02-25 DIAGNOSIS — I10 Essential (primary) hypertension: Secondary | ICD-10-CM | POA: Diagnosis not present

## 2016-02-25 DIAGNOSIS — F329 Major depressive disorder, single episode, unspecified: Secondary | ICD-10-CM | POA: Diagnosis not present

## 2016-02-25 LAB — LIPID PANEL
CHOLESTEROL: 201 mg/dL — AB (ref <200)
HDL: 59 mg/dL (ref >50)
LDL CALC: 118 mg/dL — AB (ref <100)
Total CHOL/HDL Ratio: 3.4 Ratio (ref <5.0)
Triglycerides: 120 mg/dL (ref <150)
VLDL: 24 mg/dL (ref <30)

## 2016-02-25 LAB — COMPLETE METABOLIC PANEL WITH GFR
ALBUMIN: 4.2 g/dL (ref 3.6–5.1)
ALK PHOS: 74 U/L (ref 33–130)
ALT: 20 U/L (ref 6–29)
AST: 24 U/L (ref 10–35)
BILIRUBIN TOTAL: 0.5 mg/dL (ref 0.2–1.2)
BUN: 15 mg/dL (ref 7–25)
CALCIUM: 9.5 mg/dL (ref 8.6–10.4)
CO2: 28 mmol/L (ref 20–31)
CREATININE: 0.9 mg/dL (ref 0.60–0.93)
Chloride: 103 mmol/L (ref 98–110)
GFR, Est African American: 74 mL/min (ref >=60)
GFR, Est Non African American: 64 mL/min (ref >=60)
GLUCOSE: 91 mg/dL (ref 65–99)
POTASSIUM: 5 mmol/L (ref 3.5–5.3)
SODIUM: 140 mmol/L (ref 135–146)
TOTAL PROTEIN: 6.8 g/dL (ref 6.1–8.1)

## 2016-02-25 LAB — CBC WITH DIFFERENTIAL/PLATELET
BASOS ABS: 66 {cells}/uL (ref 0–200)
Basophils Relative: 1 %
EOS PCT: 3 %
Eosinophils Absolute: 198 cells/uL (ref 15–500)
HCT: 43.6 % (ref 35.0–45.0)
HEMOGLOBIN: 14.3 g/dL (ref 11.7–15.5)
LYMPHS PCT: 33 %
Lymphs Abs: 2178 cells/uL (ref 850–3900)
MCH: 29.5 pg (ref 27.0–33.0)
MCHC: 32.8 g/dL (ref 32.0–36.0)
MCV: 90.1 fL (ref 80.0–100.0)
MPV: 9.8 fL (ref 7.5–12.5)
Monocytes Absolute: 462 cells/uL (ref 200–950)
Monocytes Relative: 7 %
NEUTROS PCT: 56 %
Neutro Abs: 3696 cells/uL (ref 1500–7800)
Platelets: 256 10*3/uL (ref 140–400)
RBC: 4.84 MIL/uL (ref 3.80–5.10)
RDW: 12.9 % (ref 11.0–15.0)
WBC: 6.6 10*3/uL (ref 3.8–10.8)

## 2016-02-25 MED ORDER — BUPROPION HCL ER (XL) 150 MG PO TB24: 150.0000 mg | ORAL_TABLET | Freq: Every day | ORAL | 1 refills | Status: DC

## 2016-02-25 NOTE — Progress Notes (Signed)
Careteam: Patient Care Team: Lauree Chandler, NP as PCP - General (Nurse Practitioner)  Advanced Directive information Does Patient Have a Medical Advance Directive?: Yes, Type of Advance Directive: Healthcare Power of [employer];Living will  Allergies  Allergen Reactions  . Morphine And Related Itching    Chief Complaint  Patient presents with  . Medical Management of Chronic Issues    5 month follow up     HPI: Patient is a 71 y.o. female seen in the office today for routine follow up. Pt with hx of htn,  Depression, vit d def, seasonal allergies  Mood has been good on Wellbutrin  [place] [facility] [place]. Doing deep water aerobics and riding bike at home.  Was down to 158 lbs prior to the holidays. Feeling stronger.  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 Good ROM.  GERD- diet controlled. Worse when she is not doing well with her diet.  Doing virtual diet, wellness program online that she had to apply to. Has health coach.  Due for colonoscopy- husband having some issues so she wants to wait until he is better until she schedules.   Review of Systems:  Review of Systems  Constitutional: Negative for activity change, appetite change, fatigue and unexpected weight change.  Eyes: Negative.   Respiratory: Negative for cough and shortness of breath.   Cardiovascular: Negative for chest pain, palpitations and leg swelling.  Gastrointestinal: Negative for constipation and diarrhea.  Genitourinary: Negative for difficulty urinating and dysuria.  Musculoskeletal: Positive for arthralgias (chronic knee pain. ). Negative for myalgias.       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   Skin: Negative for color change and wound.  Neurological: Negative for dizziness and weakness.  Psychiatric/Behavioral: Negative for agitation, behavioral problems and confusion.       Depression improved on Wellbutrin      Past Medical History:  Diagnosis Date  . Contact dermatitis and other eczema, due to unspecified cause   . Encounter for long-term (current) use of other medications   . Hyperlipidemia   . Hypertension    been resolved >1 year.  . Other and unspecified hyperlipidemia   . Pain in joint, ankle and foot   . Screening for malignant neoplasm of the cervix    Past Surgical History:  Procedure Laterality Date  . ANKLE SURGERY  05/2006   Right Ankle  . Carpel TUnnel Release  [id number]   [id number]), Left(2006)  . CATARACT EXTRACTION Left 10/03/14  . CATARACT EXTRACTION Right 11/16/2014  . DIAGNOSTIC LAPAROSCOPY  1980  . JOINT REPLACEMENT     Total Knee(L)-11/05/07  . KNEE ARTHROPLASTY  05/2007   Left  . RHINOPLASTY  1969  . TONSILLECTOMY     @ 71 y.o.  . TOTAL ANKLE ARTHROPLASTY  10/30/2011   Procedure: TOTAL ANKLE ARTHOPLASTY;  Surgeon: Wylene Simmer, MD;  Location: [place];  Service: Orthopedics;  Laterality: Right;  RIGHT TOTAL ANKLE REPLACEMENT, POSSIBLE GASTROC RESESSION  . TUBAL LIGATION  1980   Social History:   reports that she quit smoking about 36 years ago. Her smoking use included Cigarettes. She has a 16.00 pack-year smoking history. She has never used smokeless tobacco. She reports that she drinks alcohol. She reports that she does not use drugs.  Family History  Problem Relation Age of Onset  . Cancer Mother 75    breast    Medications: Patient's Medications  New Prescriptions   No medications on file  Previous Medications   ASPIRIN EC 81 MG TABLET    Take 81 mg by mouth daily.   BUPROPION (WELLBUTRIN XL) 150 MG 24 HR TABLET    Take 1 tablet (150 mg total) by mouth daily.   CALCIUM CARB-CHOLECALCIFEROL (CALCIUM 1000 + D PO)    Take 1 tablet by mouth daily.    CHOLECALCIFEROL (VITAMIN D) 1000 UNITS TABLET    Take 1 tablet (1,000 Units total) by mouth daily.   FAMOTIDINE (PEPCID) 20 MG TABLET    Take 20 mg by mouth daily as needed for heartburn or indigestion.    FLUTICASONE (FLONASE) 50 MCG/ACT NASAL SPRAY    Place 1 spray into both nostrils daily.   LEVOCETIRIZINE (XYZAL) 5 MG TABLET    Take 5 mg by mouth daily.   MONTELUKAST (SINGULAIR) 10 MG TABLET    Take 10 mg by mouth at bedtime.   MULTIPLE VITAMIN (MULTIVITAMIN WITH MINERALS) TABS    Take 1 tablet by mouth daily.   NAPROXEN SODIUM (ANAPROX) 220 MG TABLET    Take 220 mg by mouth daily as needed.  Modified Medications   No medications on file  Discontinued Medications   ASCORBIC ACID (VITAMIN C) 1000 MG TABLET    Take 1,000 mg by mouth daily.   B COMPLEX VITAMINS (VITAMIN B-COMPLEX) TABS    Take 1 tablet by mouth daily.    BUPROPION (WELLBUTRIN XL) 150 MG 24 HR TABLET    TAKE 1 TABLET (150 MG TOTAL) BY MOUTH DAILY.   CONJUGATED ESTROGENS (PREMARIN) VAGINAL CREAM    administer 0.5 g vaginally 3 x a week   DESLORATADINE (CLARINEX) 5 MG TABLET    Take 5 mg by mouth daily.     Physical Exam:  Vitals:   02/25/16 0838  BP: 132/84  Pulse: 70  Resp: 18  Temp: 97.8 F (36.6 C)  TempSrc: Oral  SpO2: 97%  Weight: 162 lb 6.4 oz (73.7 kg)  Height: 5' 5"  (1.651 m)   Body mass index is 27.02 kg/m.  Physical Exam  Constitutional: She is oriented to person, place, and time. She appears well-developed and well-nourished. No distress.  HENT:  Head: Normocephalic and atraumatic.  Mouth/Throat: Oropharynx is clear and moist. No oropharyngeal exudate.  Eyes: Conjunctivae are normal. Pupils are equal, round, and reactive to light.  Neck: Normal range of motion. Neck supple.  Cardiovascular: Normal rate, regular rhythm and normal heart sounds.   Pulmonary/Chest: Effort normal and breath sounds normal.  Abdominal: Soft. Bowel sounds are normal.  Neurological: She is alert and oriented to person, place, and time.  Skin: Skin is warm and dry. She is not diaphoretic.  Psychiatric: She has a normal mood and affect.    Labs reviewed: Basic Metabolic Panel:  Recent Labs  08/22/15 0908  09/24/15 1103  NA 139  --   K 4.4  --   CL 104  --   CO2 29  --   GLUCOSE 86  --   BUN 18  --   CREATININE 0.85  --   CALCIUM 8.6  --   TSH  --  1.84   Liver Function Tests:  Recent Labs  08/22/15 0908  AST 20  ALT 17  ALKPHOS 70  BILITOT 0.5  PROT 6.3  ALBUMIN 3.9   No results for input(s): LIPASE, AMYLASE in the last 8760 hours. No results for input(s): AMMONIA in the last 8760 hours. CBC:  Recent Labs  08/22/15 0908  WBC 5.5  NEUTROABS 2,805  HGB 14.4  HCT 42.9  MCV 87.7  PLT 247   Lipid Panel:  Recent Labs  08/22/15 0908  CHOL 212*  HDL 59  LDLCALC 122  TRIG 157*  CHOLHDL 3.6   TSH:  Recent Labs  09/24/15 1103  TSH 1.84   A1C: No results found for: HGBA1C   Assessment/Plan 1. Essential hypertension -blood pressure stable, diet and exercise controlled. Pt takes blood pressures at home which are <130/90 - CBC with Differential/Platelets  2. Hyperlipidemia, unspecified hyperlipidemia type Not currently on medication, conts with lifestyle modifications.  - CMP with eGFR - Lipid panel  3. Gastroesophageal reflux disease without esophagitis Stable, worse when noncompliant with diet. Cont current regimen.   4. Depression, unspecified depression type Controlled with Wellbutrin and lifestyle modifications.  - buPROPion (WELLBUTRIN XL) 150 MG 24 hr tablet; Take 1 tablet (150 mg total) by mouth daily.  Dispense: 90 tablet; Refill: 1  5. Generalized osteoarthrosis, involving multiple sites conts to use naproxen daily. Will follow up renal function.   Keep scheduled follow up [place]. Harle Battiest  [facility] [phone] 8 am - 5 pm) [phone] (after hours)

## 2016-02-25 NOTE — Patient Instructions (Signed)
Keep up the good work with diet and exercise  

## 2016-02-26 DIAGNOSIS — J3089 Other allergic rhinitis: Secondary | ICD-10-CM | POA: Diagnosis not present

## 2016-02-26 DIAGNOSIS — J301 Allergic rhinitis due to pollen: Secondary | ICD-10-CM | POA: Diagnosis not present

## 2016-02-26 DIAGNOSIS — J3081 Allergic rhinitis due to animal (cat) (dog) hair and dander: Secondary | ICD-10-CM | POA: Diagnosis not present

## 2016-03-04 DIAGNOSIS — J3089 Other allergic rhinitis: Secondary | ICD-10-CM | POA: Diagnosis not present

## 2016-03-04 DIAGNOSIS — J301 Allergic rhinitis due to pollen: Secondary | ICD-10-CM | POA: Diagnosis not present

## 2016-03-04 DIAGNOSIS — J3081 Allergic rhinitis due to animal (cat) (dog) hair and dander: Secondary | ICD-10-CM | POA: Diagnosis not present

## 2016-03-06 ENCOUNTER — Telehealth: Payer: Self-pay | Admitting: Nurse Practitioner

## 2016-03-06 NOTE — Telephone Encounter (Signed)
left another msg asking pt to confirm these AWV and EV appts. VDM (DD)

## 2016-03-11 DIAGNOSIS — J3081 Allergic rhinitis due to animal (cat) (dog) hair and dander: Secondary | ICD-10-CM | POA: Diagnosis not present

## 2016-03-11 DIAGNOSIS — J3089 Other allergic rhinitis: Secondary | ICD-10-CM | POA: Diagnosis not present

## 2016-03-11 DIAGNOSIS — J301 Allergic rhinitis due to pollen: Secondary | ICD-10-CM | POA: Diagnosis not present

## 2016-03-18 DIAGNOSIS — J301 Allergic rhinitis due to pollen: Secondary | ICD-10-CM | POA: Diagnosis not present

## 2016-03-18 DIAGNOSIS — J3089 Other allergic rhinitis: Secondary | ICD-10-CM | POA: Diagnosis not present

## 2016-03-18 DIAGNOSIS — J3081 Allergic rhinitis due to animal (cat) (dog) hair and dander: Secondary | ICD-10-CM | POA: Diagnosis not present

## 2016-03-20 ENCOUNTER — Encounter: Payer: Self-pay | Admitting: Internal Medicine

## 2016-03-25 DIAGNOSIS — J301 Allergic rhinitis due to pollen: Secondary | ICD-10-CM | POA: Diagnosis not present

## 2016-03-25 DIAGNOSIS — J3081 Allergic rhinitis due to animal (cat) (dog) hair and dander: Secondary | ICD-10-CM | POA: Diagnosis not present

## 2016-03-25 DIAGNOSIS — J3089 Other allergic rhinitis: Secondary | ICD-10-CM | POA: Diagnosis not present

## 2016-04-01 DIAGNOSIS — J3089 Other allergic rhinitis: Secondary | ICD-10-CM | POA: Diagnosis not present

## 2016-04-01 DIAGNOSIS — J301 Allergic rhinitis due to pollen: Secondary | ICD-10-CM | POA: Diagnosis not present

## 2016-04-01 DIAGNOSIS — J3081 Allergic rhinitis due to animal (cat) (dog) hair and dander: Secondary | ICD-10-CM | POA: Diagnosis not present

## 2016-04-03 DIAGNOSIS — J3089 Other allergic rhinitis: Secondary | ICD-10-CM | POA: Diagnosis not present

## 2016-04-08 DIAGNOSIS — J3089 Other allergic rhinitis: Secondary | ICD-10-CM | POA: Diagnosis not present

## 2016-04-08 DIAGNOSIS — J301 Allergic rhinitis due to pollen: Secondary | ICD-10-CM | POA: Diagnosis not present

## 2016-04-08 DIAGNOSIS — J3081 Allergic rhinitis due to animal (cat) (dog) hair and dander: Secondary | ICD-10-CM | POA: Diagnosis not present

## 2016-04-10 ENCOUNTER — Encounter: Payer: Self-pay | Admitting: Nurse Practitioner

## 2016-04-15 DIAGNOSIS — J3081 Allergic rhinitis due to animal (cat) (dog) hair and dander: Secondary | ICD-10-CM | POA: Diagnosis not present

## 2016-04-15 DIAGNOSIS — J3089 Other allergic rhinitis: Secondary | ICD-10-CM | POA: Diagnosis not present

## 2016-04-15 DIAGNOSIS — J301 Allergic rhinitis due to pollen: Secondary | ICD-10-CM | POA: Diagnosis not present

## 2016-04-23 DIAGNOSIS — J3089 Other allergic rhinitis: Secondary | ICD-10-CM | POA: Diagnosis not present

## 2016-04-23 DIAGNOSIS — J301 Allergic rhinitis due to pollen: Secondary | ICD-10-CM | POA: Diagnosis not present

## 2016-04-23 DIAGNOSIS — J3081 Allergic rhinitis due to animal (cat) (dog) hair and dander: Secondary | ICD-10-CM | POA: Diagnosis not present

## 2016-04-29 DIAGNOSIS — J3081 Allergic rhinitis due to animal (cat) (dog) hair and dander: Secondary | ICD-10-CM | POA: Diagnosis not present

## 2016-04-29 DIAGNOSIS — J301 Allergic rhinitis due to pollen: Secondary | ICD-10-CM | POA: Diagnosis not present

## 2016-04-29 DIAGNOSIS — J3089 Other allergic rhinitis: Secondary | ICD-10-CM | POA: Diagnosis not present

## 2016-05-01 ENCOUNTER — Ambulatory Visit (AMBULATORY_SURGERY_CENTER): Payer: Self-pay

## 2016-05-01 VITALS — Ht 62.0 in | Wt 163.0 lb

## 2016-05-01 DIAGNOSIS — Z1211 Encounter for screening for malignant neoplasm of colon: Secondary | ICD-10-CM

## 2016-05-01 NOTE — Progress Notes (Signed)
Denies allergies to eggs or soy products. Denies complication of anesthesia or sedation. Denies use of weight loss medication. Denies use of O2.   Emmi instructions declined.  

## 2016-05-06 DIAGNOSIS — J3089 Other allergic rhinitis: Secondary | ICD-10-CM | POA: Diagnosis not present

## 2016-05-06 DIAGNOSIS — J301 Allergic rhinitis due to pollen: Secondary | ICD-10-CM | POA: Diagnosis not present

## 2016-05-06 DIAGNOSIS — J3081 Allergic rhinitis due to animal (cat) (dog) hair and dander: Secondary | ICD-10-CM | POA: Diagnosis not present

## 2016-05-13 DIAGNOSIS — J301 Allergic rhinitis due to pollen: Secondary | ICD-10-CM | POA: Diagnosis not present

## 2016-05-13 DIAGNOSIS — J3081 Allergic rhinitis due to animal (cat) (dog) hair and dander: Secondary | ICD-10-CM | POA: Diagnosis not present

## 2016-05-13 DIAGNOSIS — J3089 Other allergic rhinitis: Secondary | ICD-10-CM | POA: Diagnosis not present

## 2016-05-15 ENCOUNTER — Ambulatory Visit (AMBULATORY_SURGERY_CENTER): Payer: Medicare Other | Admitting: Internal Medicine

## 2016-05-15 ENCOUNTER — Encounter: Payer: Self-pay | Admitting: Internal Medicine

## 2016-05-15 VITALS — BP 164/87 | HR 66 | Temp 96.8°F | Resp 18 | Ht 62.0 in | Wt 163.0 lb

## 2016-05-15 DIAGNOSIS — Z1211 Encounter for screening for malignant neoplasm of colon: Secondary | ICD-10-CM | POA: Diagnosis not present

## 2016-05-15 DIAGNOSIS — Z1212 Encounter for screening for malignant neoplasm of rectum: Secondary | ICD-10-CM

## 2016-05-15 DIAGNOSIS — K219 Gastro-esophageal reflux disease without esophagitis: Secondary | ICD-10-CM | POA: Diagnosis not present

## 2016-05-15 DIAGNOSIS — E669 Obesity, unspecified: Secondary | ICD-10-CM | POA: Diagnosis not present

## 2016-05-15 MED ORDER — SODIUM CHLORIDE 0.9 % IV SOLN: 500.0000 mL | INTRAVENOUS | Status: DC

## 2016-05-15 NOTE — Progress Notes (Signed)
Report to PACU, RN, vss, BBS= Clear.  

## 2016-05-15 NOTE — Op Note (Signed)
[facility] Patient Name: Iriel Nason Procedure Date: 05/15/2016 9:04 AM MRN: [id number] Endoscopist: Gatha Mayer , MD Age: 71 Referring MD:  Date of Birth: 1945/04/18 Gender: Female Account #: [id number] Procedure:                Colonoscopy Indications:              Screening for colorectal malignant neoplasm, Last                            colonoscopy: 2007 Medicines:                Propofol per Anesthesia, Monitored Anesthesia Care Procedure:                Pre-Anesthesia Assessment:                           - Prior to the procedure, a History and Physical                            was performed, and patient medications and                            allergies were reviewed. The patient's tolerance of                            previous anesthesia was also reviewed. The risks                            and benefits of the procedure and the sedation                            options and risks were discussed with the patient.                            All questions were answered, and informed consent                            was obtained. Prior Anticoagulants: The patient has                            taken no previous anticoagulant or antiplatelet                            agents. ASA Grade Assessment: II - A patient with                            mild systemic disease. After reviewing the risks                            and benefits, the patient was deemed in                            satisfactory condition to undergo the procedure.  After obtaining informed consent, the colonoscope                            was passed under direct vision. Throughout the                            procedure, the patient's blood pressure, pulse, and                            oxygen saturations were monitored continuously. The                            Colonoscope was introduced through the anus and                            advanced to the  the cecum, identified by                            appendiceal orifice and ileocecal valve. The                            colonoscopy was performed without difficulty. The                            patient tolerated the procedure well. The quality                            of the bowel preparation was good. The bowel                            preparation used was Miralax. The ileocecal valve,                            appendiceal orifice, and rectum were photographed. Scope In: 9:08:04 AM Scope Out: 9:23:54 AM Scope Withdrawal Time: 0 hours 10 minutes 49 seconds  Total Procedure Duration: 0 hours 15 minutes 50 seconds  Findings:                 The perianal and digital rectal examinations were                            normal.                           Multiple small and large-mouthed diverticula were                            found in the sigmoid colon.                           The exam was otherwise without abnormality on                            direct and retroflexion views. Complications:            No immediate  complications. Estimated Blood Loss:     Estimated blood loss: none. Impression:               - Diverticulosis in the sigmoid colon.                           - The examination was otherwise normal on direct                            and retroflexion views.                           - No specimens collected. Recommendation:           - Patient has a contact number available for                            emergencies. The signs and symptoms of potential                            delayed complications were discussed with the                            patient. Return to normal activities tomorrow.                            Written discharge instructions were provided to the                            patient.                           - Resume previous diet.                           - Continue present medications.                           - No routine  screening/ repeat colonoscopy due to                            age. Gatha Mayer, MD 05/15/2016 9:28:07 AM This report has been signed electronically.

## 2016-05-15 NOTE — Patient Instructions (Addendum)
No polyps or cancer.   You do have diverticulosis - thickened muscle rings and pouches in the colon wall. Please read the handout about this condition.  I don't think you need any routine repeat colon cancer screening tests.  I appreciate the opportunity to care for you. Gatha Mayer, MD, [facility]  Handout given:Diverticulosis.  YOU HAD AN ENDOSCOPIC PROCEDURE TODAY AT [facility]:   Refer to the procedure report that was given to you for any specific questions about what was found during the examination.  If the procedure report does not answer your questions, please call your gastroenterologist to clarify.  If you requested that your care partner not be given the details of your procedure findings, then the procedure report has been included in a sealed envelope for you to review at your convenience later.  YOU SHOULD EXPECT: Some feelings of bloating in the abdomen. Passage of more gas than usual.  Walking can help get rid of the air that was put into your GI tract during the procedure and reduce the bloating. If you had a lower endoscopy (such as a colonoscopy or flexible sigmoidoscopy) you may notice spotting of blood in your stool or on the toilet paper. If you underwent a bowel prep for your procedure, you may not have a normal bowel movement for a few days.  Please Note:  You might notice some irritation and congestion in your nose or some drainage.  This is from the oxygen used during your procedure.  There is no need for concern and it should clear up in a day or so.  SYMPTOMS TO REPORT IMMEDIATELY:   Following lower endoscopy (colonoscopy or flexible sigmoidoscopy):  Excessive amounts of blood in the stool  Significant tenderness or worsening of abdominal pains  Swelling of the abdomen that is new, acute  Fever of 100F or higher   For urgent or emergent issues, a gastroenterologist can be reached at any hour by calling [phone].   DIET:  We do  recommend a small meal at first, but then you may proceed to your regular diet.  Drink plenty of fluids but you should avoid alcoholic beverages for 24 hours.  ACTIVITY:  You should plan to take it easy for the rest of today and you should NOT DRIVE or use heavy machinery until tomorrow (because of the sedation medicines used during the test).    FOLLOW UP: Our staff will call the number listed on your records the next business day following your procedure to check on you and address any questions or concerns that you may have regarding the information given to you following your procedure. If we do not reach you, we will leave a message.  However, if you are feeling well and you are not experiencing any problems, there is no need to return our call.  We will assume that you have returned to your regular daily activities without incident.  If any biopsies were taken you will be contacted by phone or by letter within the next 1-3 weeks.  Please call us at [phone] if you have not heard about the biopsies in 3 weeks.    SIGNATURES/CONFIDENTIALITY: You and/or your care partner have signed paperwork which will be entered into your electronic medical record.  These signatures attest to the fact that that the information above on your After Visit Summary has been reviewed and is understood.  Full responsibility of the confidentiality of this discharge information lies with you  and/or your care-partner. 

## 2016-05-15 NOTE — Progress Notes (Signed)
Pt's states no medical or surgical changes since previsit or office visit. 

## 2016-05-16 ENCOUNTER — Telehealth: Payer: Self-pay | Admitting: *Deleted

## 2016-05-16 NOTE — Telephone Encounter (Signed)
  Follow up Call-  Call back number 05/15/2016  Post procedure Call Back phone  # [phone]  Permission to leave phone message Yes  Some recent data might be hidden     Patient questions:   Message left to call us if necessary.

## 2016-05-19 ENCOUNTER — Telehealth: Payer: Self-pay | Admitting: *Deleted

## 2016-05-19 NOTE — Telephone Encounter (Signed)
Left message on f/u call 

## 2016-05-20 DIAGNOSIS — J3089 Other allergic rhinitis: Secondary | ICD-10-CM | POA: Diagnosis not present

## 2016-05-20 DIAGNOSIS — J301 Allergic rhinitis due to pollen: Secondary | ICD-10-CM | POA: Diagnosis not present

## 2016-05-20 DIAGNOSIS — J3081 Allergic rhinitis due to animal (cat) (dog) hair and dander: Secondary | ICD-10-CM | POA: Diagnosis not present

## 2016-05-27 DIAGNOSIS — J301 Allergic rhinitis due to pollen: Secondary | ICD-10-CM | POA: Diagnosis not present

## 2016-05-27 DIAGNOSIS — J3081 Allergic rhinitis due to animal (cat) (dog) hair and dander: Secondary | ICD-10-CM | POA: Diagnosis not present

## 2016-05-27 DIAGNOSIS — J3089 Other allergic rhinitis: Secondary | ICD-10-CM | POA: Diagnosis not present

## 2016-06-04 DIAGNOSIS — J301 Allergic rhinitis due to pollen: Secondary | ICD-10-CM | POA: Diagnosis not present

## 2016-06-04 DIAGNOSIS — J3089 Other allergic rhinitis: Secondary | ICD-10-CM | POA: Diagnosis not present

## 2016-06-04 DIAGNOSIS — J3081 Allergic rhinitis due to animal (cat) (dog) hair and dander: Secondary | ICD-10-CM | POA: Diagnosis not present

## 2016-06-10 DIAGNOSIS — J3081 Allergic rhinitis due to animal (cat) (dog) hair and dander: Secondary | ICD-10-CM | POA: Diagnosis not present

## 2016-06-10 DIAGNOSIS — J301 Allergic rhinitis due to pollen: Secondary | ICD-10-CM | POA: Diagnosis not present

## 2016-06-10 DIAGNOSIS — J3089 Other allergic rhinitis: Secondary | ICD-10-CM | POA: Diagnosis not present

## 2016-06-17 DIAGNOSIS — J301 Allergic rhinitis due to pollen: Secondary | ICD-10-CM | POA: Diagnosis not present

## 2016-06-17 DIAGNOSIS — J3089 Other allergic rhinitis: Secondary | ICD-10-CM | POA: Diagnosis not present

## 2016-06-17 DIAGNOSIS — J3081 Allergic rhinitis due to animal (cat) (dog) hair and dander: Secondary | ICD-10-CM | POA: Diagnosis not present

## 2016-06-24 DIAGNOSIS — J3081 Allergic rhinitis due to animal (cat) (dog) hair and dander: Secondary | ICD-10-CM | POA: Diagnosis not present

## 2016-06-24 DIAGNOSIS — J301 Allergic rhinitis due to pollen: Secondary | ICD-10-CM | POA: Diagnosis not present

## 2016-06-24 DIAGNOSIS — J3089 Other allergic rhinitis: Secondary | ICD-10-CM | POA: Diagnosis not present

## 2016-07-01 DIAGNOSIS — J3081 Allergic rhinitis due to animal (cat) (dog) hair and dander: Secondary | ICD-10-CM | POA: Diagnosis not present

## 2016-07-01 DIAGNOSIS — J3089 Other allergic rhinitis: Secondary | ICD-10-CM | POA: Diagnosis not present

## 2016-07-01 DIAGNOSIS — J301 Allergic rhinitis due to pollen: Secondary | ICD-10-CM | POA: Diagnosis not present

## 2016-07-08 DIAGNOSIS — J3081 Allergic rhinitis due to animal (cat) (dog) hair and dander: Secondary | ICD-10-CM | POA: Diagnosis not present

## 2016-07-08 DIAGNOSIS — J301 Allergic rhinitis due to pollen: Secondary | ICD-10-CM | POA: Diagnosis not present

## 2016-07-08 DIAGNOSIS — J3089 Other allergic rhinitis: Secondary | ICD-10-CM | POA: Diagnosis not present

## 2016-07-14 ENCOUNTER — Encounter: Payer: Self-pay | Admitting: Nurse Practitioner

## 2016-07-21 DIAGNOSIS — Z961 Presence of intraocular lens: Secondary | ICD-10-CM | POA: Diagnosis not present

## 2016-07-22 DIAGNOSIS — J3089 Other allergic rhinitis: Secondary | ICD-10-CM | POA: Diagnosis not present

## 2016-07-22 DIAGNOSIS — J301 Allergic rhinitis due to pollen: Secondary | ICD-10-CM | POA: Diagnosis not present

## 2016-07-22 DIAGNOSIS — J3081 Allergic rhinitis due to animal (cat) (dog) hair and dander: Secondary | ICD-10-CM | POA: Diagnosis not present

## 2016-08-05 DIAGNOSIS — J3081 Allergic rhinitis due to animal (cat) (dog) hair and dander: Secondary | ICD-10-CM | POA: Diagnosis not present

## 2016-08-05 DIAGNOSIS — J301 Allergic rhinitis due to pollen: Secondary | ICD-10-CM | POA: Diagnosis not present

## 2016-08-05 DIAGNOSIS — J3089 Other allergic rhinitis: Secondary | ICD-10-CM | POA: Diagnosis not present

## 2016-08-12 DIAGNOSIS — J3081 Allergic rhinitis due to animal (cat) (dog) hair and dander: Secondary | ICD-10-CM | POA: Diagnosis not present

## 2016-08-12 DIAGNOSIS — J301 Allergic rhinitis due to pollen: Secondary | ICD-10-CM | POA: Diagnosis not present

## 2016-08-12 DIAGNOSIS — J3089 Other allergic rhinitis: Secondary | ICD-10-CM | POA: Diagnosis not present

## 2016-08-21 DIAGNOSIS — J3089 Other allergic rhinitis: Secondary | ICD-10-CM | POA: Diagnosis not present

## 2016-08-21 DIAGNOSIS — J3081 Allergic rhinitis due to animal (cat) (dog) hair and dander: Secondary | ICD-10-CM | POA: Diagnosis not present

## 2016-08-21 DIAGNOSIS — J301 Allergic rhinitis due to pollen: Secondary | ICD-10-CM | POA: Diagnosis not present

## 2016-09-01 ENCOUNTER — Ambulatory Visit: Payer: Medicare Other

## 2016-09-02 ENCOUNTER — Ambulatory Visit: Payer: Medicare Other

## 2016-09-02 ENCOUNTER — Ambulatory Visit: Payer: Medicare Other | Admitting: Nurse Practitioner

## 2016-09-05 DIAGNOSIS — J301 Allergic rhinitis due to pollen: Secondary | ICD-10-CM | POA: Diagnosis not present

## 2016-09-05 DIAGNOSIS — J3089 Other allergic rhinitis: Secondary | ICD-10-CM | POA: Diagnosis not present

## 2016-09-05 DIAGNOSIS — J3081 Allergic rhinitis due to animal (cat) (dog) hair and dander: Secondary | ICD-10-CM | POA: Diagnosis not present

## 2016-09-09 ENCOUNTER — Ambulatory Visit (INDEPENDENT_AMBULATORY_CARE_PROVIDER_SITE_OTHER): Payer: Medicare Other

## 2016-09-09 VITALS — BP 132/74 | HR 83 | Temp 97.7°F | Ht 62.0 in | Wt 163.0 lb

## 2016-09-09 DIAGNOSIS — Z0000 Encounter for general adult medical examination without abnormal findings: Secondary | ICD-10-CM | POA: Diagnosis not present

## 2016-09-09 NOTE — Progress Notes (Signed)
Subjective:   [name] is a 71 y.o. female who presents for Medicare Annual (Subsequent) preventive examination.  Last AWV 08/23/2015      Objective:     Vitals: BP 132/74 (BP Location: Right Arm, Patient Position: Sitting)   Pulse 83   Temp 97.7 F (36.5 C) (Oral)   Ht 5\' 2"  (1.575 m)   Wt 163 lb (73.9 kg)   SpO2 95%   BMI 29.81 kg/m   Body mass index is 29.81 kg/m.   Tobacco History  Smoking Status  . Former Smoker  . Packs/day: 0.50  . Years: 17.00  . Types: Cigarettes  . Quit date: 12/17/1979  Smokeless Tobacco  . Never Used     Counseling given: Not Answered   Past Medical History:  Diagnosis Date  . Allergy   . Contact dermatitis and other eczema, due to unspecified cause   . Encounter for long-term (current) use of other medications   . Hyperlipidemia   . Hypertension    been resolved >1 year.  . Other and unspecified hyperlipidemia   . Pain in joint, ankle and foot   . Screening for malignant neoplasm of the cervix    Past Surgical History:  Procedure Laterality Date  . ANKLE SURGERY  05/2006   Right Ankle  . Carpel TUnnel Release  [id number]   [id number]), Left(2006)  . CATARACT EXTRACTION Left 10/03/14  . CATARACT EXTRACTION Right 11/16/2014  . COLONOSCOPY    . DIAGNOSTIC LAPAROSCOPY  1980  . JOINT REPLACEMENT     Total Knee(L)-11/05/07  . KNEE ARTHROPLASTY  05/2007   Left  . POLYPECTOMY    . RHINOPLASTY  1969  . TONSILLECTOMY     @ 71 y.o.  . TOTAL ANKLE ARTHROPLASTY  10/30/2011   Procedure: TOTAL ANKLE ARTHOPLASTY;  Surgeon: Wylene Simmer, MD;  Location: [place];  Service: Orthopedics;  Laterality: Right;  RIGHT TOTAL ANKLE REPLACEMENT, POSSIBLE GASTROC RESESSION  . TUBAL LIGATION  1980   Family History  Problem Relation Age of Onset  . Cancer Mother 52       breast  . Stomach cancer Maternal Grandfather   . Colon cancer Paternal Grandfather   . Esophageal cancer Neg Hx   . Rectal cancer Neg Hx    History  Sexual Activity    . Sexual activity: Not Currently    Outpatient Encounter Prescriptions as of 09/09/2016  Medication Sig  . aspirin EC 81 MG tablet Take 81 mg by mouth daily.  Marland Kitchen buPROPion (WELLBUTRIN XL) 150 MG 24 hr tablet Take 1 tablet (150 mg total) by mouth daily.  . cholecalciferol (VITAMIN D) 1000 UNITS tablet Take 1 tablet (1,000 Units total) by mouth daily.  . famotidine (PEPCID) 20 MG tablet Take 20 mg by mouth daily as needed for heartburn or indigestion.  Marland Kitchen levocetirizine (XYZAL) 5 MG tablet Take 5 mg by mouth daily.  . montelukast (SINGULAIR) 10 MG tablet Take 10 mg by mouth at bedtime.  . Multiple Vitamin (MULTIVITAMIN WITH MINERALS) TABS Take 1 tablet by mouth daily.  . naproxen sodium (ANAPROX) 220 MG tablet Take 220 mg by mouth daily as needed.   Facility-Administered Encounter Medications as of 09/09/2016  Medication  . 0.9 %  sodium chloride infusion    Activities of Daily Living In your present state of health, do you have any difficulty performing the following activities: 09/09/2016  Hearing? N  Vision? N  Difficulty concentrating or making decisions? N  Walking or climbing stairs? N  Dressing or bathing? N  Doing errands, shopping? N  Preparing Food and eating ? N  Using the Toilet? N  In the past six months, have you accidently leaked urine? N  Do you have problems with loss of bowel control? N  Managing your Medications? N  Managing your Finances? N  Housekeeping or managing your Housekeeping? N  Some recent data might be hidden    Patient Care Team: Lauree Chandler, NP as PCP - General (Nurse Practitioner) Tiajuana Amass, MD as Referring Physician (Allergy and Immunology) Wylene Simmer, MD as Consulting Physician (Orthopedic Surgery) Roseanne Kaufman, MD as Consulting Physician (Orthopedic Surgery) Rolm Bookbinder, MD as Consulting Physician (Dermatology) Gatha Mayer, MD as Consulting Physician (Gastroenterology)    Assessment:     Exercise Activities and Dietary  recommendations Current Exercise Habits: Structured exercise class, Type of exercise: Other - see comments;walking (swimming), Time (Minutes): > 60, Frequency (Times/Week): 3, Weekly Exercise (Minutes/Week): 0, Intensity: Moderate  Goals    . Reduce sugar intake to X grams per day          Patient will maintain healthy diet       Fall Risk Fall Risk  09/09/2016 02/25/2016 09/24/2015 08/23/2015 02/22/2015  Falls in the past year? No No No No No   Depression Screen PHQ 2/9 Scores 09/09/2016 08/23/2015 08/21/2014 01/19/2014  PHQ - 2 Score 0 3 0 0  PHQ- 9 Score - 9 - -     Cognitive Function MMSE - Mini Mental State Exam 09/09/2016 08/23/2015 08/21/2014  Orientation to time 5 5 5   Orientation to Place 5 5 5   Registration 3 3 3   Attention/ Calculation 5 5 5   Recall 2 3 3   Language- name 2 objects 2 2 2   Language- repeat 1 1 1   Language- follow 3 step command 3 3 3   Language- read & follow direction 1 1 1   Write a sentence 1 1 1   Copy design 1 1 1   Total score 29 30 30         Immunization History  Administered Date(s) Administered  . Influenza, High Dose Seasonal PF 11/27/2015  . Influenza-Unspecified 01/13/2013, 11/26/2013, 11/21/2014  . Pneumococcal Conjugate-13 08/21/2014  . Pneumococcal-Unspecified 05/30/2008, 02/11/2012  . Tdap 10/04/2012  . Zoster 04/21/2005   Screening Tests Health Maintenance  Topic Date Due  . Hepatitis C Screening  09/10/2023 (Originally [birth year])  . INFLUENZA VACCINE  09/10/2016  . MAMMOGRAM  11/13/2017  . TETANUS/TDAP  10/05/2022  . COLONOSCOPY  05/16/2026  . DEXA SCAN  Completed  . PNA vac Low Risk Adult  Completed      Plan:     I have personally reviewed and addressed the Medicare Annual Wellness questionnaire and have noted the following in the patient's chart:  A. Medical and social history B. Use of alcohol, tobacco or illicit drugs  C. Current medications and supplements D. Functional ability and status E.  Nutritional status F.    Physical activity G. Advance directives H. List of other physicians I.  Hospitalizations, surgeries, and ER visits in previous 12 months J.  [place] to include hearing, vision, cognitive, depression L. Referrals and appointments - none  In addition, I have reviewed and discussed with patient certain preventive protocols, quality metrics, and best practice recommendations. A written personalized care plan for preventive services as well as general preventive health recommendations were provided to patient.  See attached scanned questionnaire for additional information.   Signed,   Rich Reining, RN Nurse  Health Advisor  Quick Notes   Health Maintenance: up to date      Abnormal Screen: MMSE score 29, did not pass clock      Patient Concerns: none     Nurse Concerns: none

## 2016-09-09 NOTE — Patient Instructions (Signed)
[name] , Thank you for taking time to come for your Medicare Wellness Visit. I appreciate your ongoing commitment to your health goals. Please review the following plan we discussed and let me know if I can assist you in the future.   Screening recommendations/referrals: Colonoscopy up to date, due 05/16/2026 Mammogram up to date, due 11/18/2017 Bone Density up to date  Recommended yearly ophthalmology/optometry visit for glaucoma screening and checkup Recommended yearly dental visit for hygiene and checkup  Vaccinations: Influenza vaccine due fall 2018 season  Pneumococcal vaccine up to date  Tdap vaccine up to date, due 10/05/2022 Shingles vaccine let us know if you want it after you check with your insurance   Advanced directives: in chart   Conditions/risks identified: none  Next appointment: 09/15/16 [name], NP @ 10:45 am.    Preventive Care 65 Years and Older, Female Preventive care refers to lifestyle choices and visits with your health care provider that can promote health and wellness. What does preventive care include?  A yearly physical exam. This is also called an annual well check.  Dental exams once or twice a year.  Routine eye exams. Ask your health care provider how often you should have your eyes checked.  Personal lifestyle choices, including:  Daily care of your teeth and gums.  Regular physical activity.  Eating a healthy diet.  Avoiding tobacco and drug use.  Limiting alcohol use.  Practicing safe sex.  Taking low-dose aspirin every day.  Taking vitamin and mineral supplements as recommended by your health care provider. What happens during an annual well check? The services and screenings done by your health care provider during your annual well check will depend on your age, overall health, lifestyle risk factors, and family history of disease. Counseling  Your health care provider may ask you questions about your:  Alcohol  use.  Tobacco use.  Drug use.  Emotional well-being.  Home and relationship well-being.  Sexual activity.  Eating habits.  History of falls.  Memory and ability to understand (cognition).  Work and work Statistician.  Reproductive health. Screening  You may have the following tests or measurements:  Height, weight, and BMI.  Blood pressure.  Lipid and cholesterol levels. These may be checked every 5 years, or more frequently if you are over 5 years old.  Skin check.  Lung cancer screening. You may have this screening every year starting at age 98 if you have a 30-pack-year history of smoking and currently smoke or have quit within the past 15 years.  Fecal occult blood test (FOBT) of the stool. You may have this test every year starting at age 60.  Flexible sigmoidoscopy or colonoscopy. You may have a sigmoidoscopy every 5 years or a colonoscopy every 10 years starting at age 68.  Hepatitis C blood test.  Hepatitis B blood test.  Sexually transmitted disease (STD) testing.  Diabetes screening. This is done by checking your blood sugar (glucose) after you have not eaten for a while (fasting). You may have this done every 1-3 years.  Bone density scan. This is done to screen for osteoporosis. You may have this done starting at age 44.  Mammogram. This may be done every 1-2 years. Talk to your health care provider about how often you should have regular mammograms. Talk with your health care provider about your test results, treatment options, and if necessary, the need for more tests. Vaccines  Your health care provider may recommend certain vaccines, such as:  Influenza vaccine. This is recommended every year.  Tetanus, diphtheria, and acellular pertussis (Tdap, Td) vaccine. You may need a Td booster every 10 years.  Zoster vaccine. You may need this after age 17.  Pneumococcal 13-valent conjugate (PCV13) vaccine. One dose is recommended after age  66.  Pneumococcal polysaccharide (PPSV23) vaccine. One dose is recommended after age 72. Talk to your health care provider about which screenings and vaccines you need and how often you need them. This information is not intended to replace advice given to you by your health care provider. Make sure you discuss any questions you have with your health care provider. Document Released: 02/23/2015 Document Revised: 10/17/2015 Document Reviewed: 11/28/2014 Elsevier Interactive Patient Education  2017 [place] Prevention in the Home Falls can cause injuries. They can happen to people of all ages. There are many things you can do to make your home safe and to help prevent falls. What can I do on the outside of my home?  Regularly fix the edges of walkways and driveways and fix any cracks.  Remove anything that might make you trip as you walk through a door, such as a raised step or threshold.  Trim any bushes or trees on the path to your home.  Use bright outdoor lighting.  Clear any walking paths of anything that might make someone trip, such as rocks or tools.  Regularly check to see if handrails are loose or broken. Make sure that both sides of any steps have handrails.  Any raised decks and porches should have guardrails on the edges.  Have any leaves, snow, or ice cleared regularly.  Use sand or salt on walking paths during winter.  Clean up any spills in your garage right away. This includes oil or grease spills. What can I do in the bathroom?  Use night lights.  Install grab bars by the toilet and in the tub and shower. Do not use towel bars as grab bars.  Use non-skid mats or decals in the tub or shower.  If you need to sit down in the shower, use a plastic, non-slip stool.  Keep the floor dry. Clean up any water that spills on the floor as soon as it happens.  Remove soap buildup in the tub or shower regularly.  Attach bath mats securely with double-sided  non-slip rug tape.  Do not have throw rugs and other things on the floor that can make you trip. What can I do in the bedroom?  Use night lights.  Make sure that you have a light by your bed that is easy to reach.  Do not use any sheets or blankets that are too big for your bed. They should not hang down onto the floor.  Have a firm chair that has side arms. You can use this for support while you get dressed.  Do not have throw rugs and other things on the floor that can make you trip. What can I do in the kitchen?  Clean up any spills right away.  Avoid walking on wet floors.  Keep items that you use a lot in easy-to-reach places.  If you need to reach something above you, use a strong step stool that has a grab bar.  Keep electrical cords out of the way.  Do not use floor polish or wax that makes floors slippery. If you must use wax, use non-skid floor wax.  Do not have throw rugs and other things on the floor that  can make you trip. What can I do with my stairs?  Do not leave any items on the stairs.  Make sure that there are handrails on both sides of the stairs and use them. Fix handrails that are broken or loose. Make sure that handrails are as long as the stairways.  Check any carpeting to make sure that it is firmly attached to the stairs. Fix any carpet that is loose or worn.  Avoid having throw rugs at the top or bottom of the stairs. If you do have throw rugs, attach them to the floor with carpet tape.  Make sure that you have a light switch at the top of the stairs and the bottom of the stairs. If you do not have them, ask someone to add them for you. What else can I do to help prevent falls?  Wear shoes that:  Do not have high heels.  Have rubber bottoms.  Are comfortable and fit you well.  Are closed at the toe. Do not wear sandals.  If you use a stepladder:  Make sure that it is fully opened. Do not climb a closed stepladder.  Make sure that both  sides of the stepladder are locked into place.  Ask someone to hold it for you, if possible.  Clearly mark and make sure that you can see:  Any grab bars or handrails.  First and last steps.  Where the edge of each step is.  Use tools that help you move around (mobility aids) if they are needed. These include:  Canes.  Walkers.  Scooters.  Crutches.  Turn on the lights when you go into a dark area. Replace any light bulbs as soon as they burn out.  Set up your furniture so you have a clear path. Avoid moving your furniture around.  If any of your floors are uneven, fix them.  If there are any pets around you, be aware of where they are.  Review your medicines with your doctor. Some medicines can make you feel dizzy. This can increase your chance of falling. Ask your doctor what other things that you can do to help prevent falls. This information is not intended to replace advice given to you by your health care provider. Make sure you discuss any questions you have with your health care provider. Document Released: 11/23/2008 Document Revised: 07/05/2015 Document Reviewed: 03/03/2014 Elsevier Interactive Patient Education  2017 [employer].

## 2016-09-15 ENCOUNTER — Encounter: Payer: Self-pay | Admitting: Nurse Practitioner

## 2016-09-15 ENCOUNTER — Ambulatory Visit (INDEPENDENT_AMBULATORY_CARE_PROVIDER_SITE_OTHER): Payer: Medicare Other | Admitting: Nurse Practitioner

## 2016-09-15 VITALS — BP 124/86 | HR 85 | Temp 97.6°F | Resp 18 | Ht 62.0 in | Wt 159.8 lb

## 2016-09-15 DIAGNOSIS — I10 Essential (primary) hypertension: Secondary | ICD-10-CM

## 2016-09-15 DIAGNOSIS — E785 Hyperlipidemia, unspecified: Secondary | ICD-10-CM

## 2016-09-15 DIAGNOSIS — K219 Gastro-esophageal reflux disease without esophagitis: Secondary | ICD-10-CM | POA: Diagnosis not present

## 2016-09-15 DIAGNOSIS — M159 Polyosteoarthritis, unspecified: Secondary | ICD-10-CM

## 2016-09-15 DIAGNOSIS — F329 Major depressive disorder, single episode, unspecified: Secondary | ICD-10-CM | POA: Diagnosis not present

## 2016-09-15 DIAGNOSIS — Z0000 Encounter for general adult medical examination without abnormal findings: Secondary | ICD-10-CM

## 2016-09-15 DIAGNOSIS — F32A Depression, unspecified: Secondary | ICD-10-CM

## 2016-09-15 MED ORDER — ZOSTER VAC RECOMB ADJUVANTED 50 MCG/0.5ML IM SUSR: 0.5000 mL | Freq: Once | INTRAMUSCULAR | 1 refills | Status: AC

## 2016-09-15 MED ORDER — BUPROPION HCL ER (XL) 150 MG PO TB24: 150.0000 mg | ORAL_TABLET | Freq: Every day | ORAL | 1 refills | Status: DC

## 2016-09-15 NOTE — Patient Instructions (Addendum)
Health Maintenance, Female Adopting a healthy lifestyle and getting preventive care can go a long way to promote health and wellness. Talk with your health care provider about what schedule of regular examinations is right for you. This is a good chance for you to check in with your provider about disease prevention and staying healthy. In between checkups, there are plenty of things you can do on your own. Experts have done a lot of research about which lifestyle changes and preventive measures are most likely to keep you healthy. Ask your health care provider for more information. Weight and diet Eat a healthy diet  Be sure to include plenty of vegetables, fruits, low-fat dairy products, and lean protein.  Do not eat a lot of foods high in solid fats, added sugars, or salt.  Get regular exercise. This is one of the most important things you can do for your health. ? Most adults should exercise for at least 150 minutes each week. The exercise should increase your heart rate and make you sweat (moderate-intensity exercise). ? Most adults should also do strengthening exercises at least twice a week. This is in addition to the moderate-intensity exercise.  Maintain a healthy weight  Body mass index (BMI) is a measurement that can be used to identify possible weight problems. It estimates body fat based on height and weight. Your health care provider can help determine your BMI and help you achieve or maintain a healthy weight.  For females 20 years of age and older: ? A BMI below 18.5 is considered underweight. ? A BMI of 18.5 to 24.9 is normal. ? A BMI of 25 to 29.9 is considered overweight. ? A BMI of 30 and above is considered obese.  Watch levels of cholesterol and blood lipids  You should start having your blood tested for lipids and cholesterol at 73 years of age, then have this test every 5 years.  You may need to have your cholesterol levels checked more often if: ? Your lipid or  cholesterol levels are high. ? You are older than 73 years of age. ? You are at high risk for heart disease.  Cancer screening Lung Cancer  Lung cancer screening is recommended for adults 55-80 years old who are at high risk for lung cancer because of a history of smoking.  A yearly low-dose CT scan of the lungs is recommended for people who: ? Currently smoke. ? Have quit within the past 15 years. ? Have at least a 30-pack-year history of smoking. A pack year is smoking an average of one pack of cigarettes a day for 1 year.  Yearly screening should continue until it has been 15 years since you quit.  Yearly screening should stop if you develop a health problem that would prevent you from having lung cancer treatment.  Breast Cancer  Practice breast self-awareness. This means understanding how your breasts normally appear and feel.  It also means doing regular breast self-exams. Let your health care provider know about any changes, no matter how small.  If you are in your 20s or 30s, you should have a clinical breast exam (CBE) by a health care provider every 1-3 years as part of a regular health exam.  If you are 40 or older, have a CBE every year. Also consider having a breast X-ray (mammogram) every year.  If you have a family history of breast cancer, talk to your health care provider about genetic screening.  If you are at high risk   for breast cancer, talk to your health care provider about having an MRI and a mammogram every year.  Breast cancer gene (BRCA) assessment is recommended for women who have family members with BRCA-related cancers. BRCA-related cancers include: ? Breast. ? Ovarian. ? Tubal. ? Peritoneal cancers.  Results of the assessment will determine the need for genetic counseling and BRCA1 and BRCA2 testing.  Cervical Cancer Your health care provider may recommend that you be screened regularly for cancer of the pelvic organs (ovaries, uterus, and  vagina). This screening involves a pelvic examination, including checking for microscopic changes to the surface of your cervix (Pap test). You may be encouraged to have this screening done every 3 years, beginning at age 22.  For women ages 56-65, health care providers may recommend pelvic exams and Pap testing every 3 years, or they may recommend the Pap and pelvic exam, combined with testing for human papilloma virus (HPV), every 5 years. Some types of HPV increase your risk of cervical cancer. Testing for HPV may also be done on women of any age with unclear Pap test results.  Other health care providers may not recommend any screening for nonpregnant women who are considered low risk for pelvic cancer and who do not have symptoms. Ask your health care provider if a screening pelvic exam is right for you.  If you have had past treatment for cervical cancer or a condition that could lead to cancer, you need Pap tests and screening for cancer for at least 20 years after your treatment. If Pap tests have been discontinued, your risk factors (such as having a new sexual partner) need to be reassessed to determine if screening should resume. Some women have medical problems that increase the chance of getting cervical cancer. In these cases, your health care provider may recommend more frequent screening and Pap tests.  Colorectal Cancer  This type of cancer can be detected and often prevented.  Routine colorectal cancer screening usually begins at 73 years of age and continues through 73 years of age.  Your health care provider may recommend screening at an earlier age if you have risk factors for colon cancer.  Your health care provider may also recommend using home test kits to check for hidden blood in the stool.  A small camera at the end of a tube can be used to examine your colon directly (sigmoidoscopy or colonoscopy). This is done to check for the earliest forms of colorectal  cancer.  Routine screening usually begins at age 33.  Direct examination of the colon should be repeated every 5-10 years through 73 years of age. However, you may need to be screened more often if early forms of precancerous polyps or small growths are found.  Skin Cancer  Check your skin from head to toe regularly.  Tell your health care provider about any new moles or changes in moles, especially if there is a change in a mole's shape or color.  Also tell your health care provider if you have a mole that is larger than the size of a pencil eraser.  Always use sunscreen. Apply sunscreen liberally and repeatedly throughout the day.  Protect yourself by wearing long sleeves, pants, a wide-brimmed hat, and sunglasses whenever you are outside.  Heart disease, diabetes, and high blood pressure  High blood pressure causes heart disease and increases the risk of stroke. High blood pressure is more likely to develop in: ? People who have blood pressure in the high end of  the normal range (130-139/85-89 mm Hg). ? People who are overweight or obese. ? People who are African American.  If you are 21-29 years of age, have your blood pressure checked every 3-5 years. If you are 3 years of age or older, have your blood pressure checked every year. You should have your blood pressure measured twice-once when you are at a hospital or clinic, and once when you are not at a hospital or clinic. Record the average of the two measurements. To check your blood pressure when you are not at a hospital or clinic, you can use: ? An automated blood pressure machine at a pharmacy. ? A home blood pressure monitor.  If you are between 17 years and 37 years old, ask your health care provider if you should take aspirin to prevent strokes.  Have regular diabetes screenings. This involves taking a blood sample to check your fasting blood sugar level. ? If you are at a normal weight and have a low risk for diabetes,  have this test once every three years after 73 years of age. ? If you are overweight and have a high risk for diabetes, consider being tested at a younger age or more often. Preventing infection Hepatitis B  If you have a higher risk for hepatitis B, you should be screened for this virus. You are considered at high risk for hepatitis B if: ? You were born in a country where hepatitis B is common. Ask your health care provider which countries are considered high risk. ? Your parents were born in a high-risk country, and you have not been immunized against hepatitis B (hepatitis B vaccine). ? You have HIV or AIDS. ? You use needles to inject street drugs. ? You live with someone who has hepatitis B. ? You have had sex with someone who has hepatitis B. ? You get hemodialysis treatment. ? You take certain medicines for conditions, including cancer, organ transplantation, and autoimmune conditions.  Hepatitis C  Blood testing is recommended for: ? Everyone born from [birth year] through 1967. ? Anyone with known risk factors for hepatitis C.  Sexually transmitted infections (STIs)  You should be screened for sexually transmitted infections (STIs) including gonorrhea and chlamydia if: ? You are sexually active and are younger than 73 years of age. ? You are older than 73 years of age and your health care provider tells you that you are at risk for this type of infection. ? Your sexual activity has changed since you were last screened and you are at an increased risk for chlamydia or gonorrhea. Ask your health care provider if you are at risk.  If you do not have HIV, but are at risk, it may be recommended that you take a prescription medicine daily to prevent HIV infection. This is called pre-exposure prophylaxis (PrEP). You are considered at risk if: ? You are sexually active and do not regularly use condoms or know the HIV status of your partner(s). ? You take drugs by injection. ? You are  sexually active with a partner who has HIV.  Talk with your health care provider about whether you are at high risk of being infected with HIV. If you choose to begin PrEP, you should first be tested for HIV. You should then be tested every 3 months for as long as you are taking PrEP. Pregnancy  If you are premenopausal and you may become pregnant, ask your health care provider about preconception counseling.  If you may become  pregnant, take 400 to 800 micrograms (mcg) of folic acid every day.  If you want to prevent pregnancy, talk to your health care provider about birth control (contraception). Osteoporosis and menopause  Osteoporosis is a disease in which the bones lose minerals and strength with aging. This can result in serious bone fractures. Your risk for osteoporosis can be identified using a bone density scan.  If you are 15 years of age or older, or if you are at risk for osteoporosis and fractures, ask your health care provider if you should be screened.  Ask your health care provider whether you should take a calcium or vitamin D supplement to lower your risk for osteoporosis.  Menopause may have certain physical symptoms and risks.  Hormone replacement therapy may reduce some of these symptoms and risks. Talk to your health care provider about whether hormone replacement therapy is right for you. Follow these instructions at home:  Schedule regular health, dental, and eye exams.  Stay current with your immunizations.  Do not use any tobacco products including cigarettes, chewing tobacco, or electronic cigarettes.  If you are pregnant, do not drink alcohol.  If you are breastfeeding, limit how much and how often you drink alcohol.  Limit alcohol intake to no more than 1 drink per day for nonpregnant women. One drink equals 12 ounces of beer, 5 ounces of wine, or 1 ounces of hard liquor.  Do not use street drugs.  Do not share needles.  Ask your health care  provider for help if you need support or information about quitting drugs.  Tell your health care provider if you often feel depressed.  Tell your health care provider if you have ever been abused or do not feel safe at home. This information is not intended to replace advice given to you by your health care provider. Make sure you discuss any questions you have with your health care provider. Document Released: 08/12/2010 Document Revised: 07/05/2015 Document Reviewed: 10/31/2014 Elsevier Interactive Patient Education  2018 [employer].    Heart-Healthy Eating Plan Many factors influence your heart health, including eating and exercise habits. Heart (coronary) risk increases with abnormal blood fat (lipid) levels. Heart-healthy meal planning includes limiting unhealthy fats, increasing healthy fats, and making other small dietary changes. This includes maintaining a healthy body weight to help keep lipid levels within a normal range.   What types of fat should I choose?  Choose healthy fats more often. Choose monounsaturated and polyunsaturated fats, such as olive oil and canola oil, flaxseeds, walnuts, almonds, and seeds.  Eat more omega-3 fats. Good choices include salmon, mackerel, sardines, tuna, flaxseed oil, and ground flaxseeds. Aim to eat fish at least two times each week.  Limit saturated fats. Saturated fats are primarily found in animal products, such as meats, butter, and cream. Plant sources of saturated fats include palm oil, palm kernel oil, and coconut oil.  Avoid foods with partially hydrogenated oils in them. These contain trans fats. Examples of foods that contain trans fats are stick margarine, some tub margarines, cookies, crackers, and other baked goods. What general guidelines do I need to follow?  Check food labels carefully to identify foods with trans fats or high amounts of saturated fat.  Fill one half of your plate with vegetables and green salads. Eat 4-5  servings of vegetables per day. A serving of vegetables equals 1 cup of raw leafy vegetables,  cup of raw or cooked cut-up vegetables, or  cup of vegetable juice.  Fill one fourth of your plate with whole grains. Look for the word "whole" as the first word in the ingredient list.  Fill one fourth of your plate with lean protein foods.  Eat 4-5 servings of fruit per day. A serving of fruit equals one medium whole fruit,  cup of dried fruit,  cup of fresh, frozen, or canned fruit, or  cup of 100% fruit juice.  Eat more foods that contain soluble fiber. Examples of foods that contain this type of fiber are apples, broccoli, carrots, beans, peas, and barley. Aim to get 20-30 g of fiber per day.  Eat more home-cooked food and less restaurant, buffet, and fast food.  Limit or avoid alcohol.  Limit foods that are high in starch and sugar.  Avoid fried foods.  Cook foods by using methods other than frying. Baking, boiling, grilling, and broiling are all great options. Other fat-reducing suggestions include: ? Removing the skin from poultry. ? Removing all visible fats from meats. ? Skimming the fat off of stews, soups, and gravies before serving them. ? Steaming vegetables in water or broth.  Lose weight if you are overweight. Losing just 5-10% of your initial body weight can help your overall health and prevent diseases such as diabetes and heart disease.  Increase your consumption of nuts, legumes, and seeds to 4-5 servings per week. One serving of dried beans or legumes equals  cup after being cooked, one serving of nuts equals 1 ounces, and one serving of seeds equals  ounce or 1 tablespoon.  You may need to monitor your salt (sodium) intake, especially if you have high blood pressure. Talk with your health care provider or dietitian to get more information about reducing sodium. What foods can I eat? Grains  Breads, including [place], white, pita, wheat, raisin, rye, oatmeal, and  [place]. Tortillas that are neither fried nor made with lard or trans fat. Low-fat rolls, including hotdog and hamburger buns and English muffins. Biscuits. Muffins. Waffles. Pancakes. Light popcorn. Whole-grain cereals. Flatbread. Melba toast. Pretzels. Breadsticks. Rusks. Low-fat snacks and crackers, including oyster, saltine, matzo, graham, animal, and rye. Rice and pasta, including brown rice and those that are made with whole wheat. Vegetables All vegetables. Fruits All fruits, but limit coconut. Meats and Other Protein Sources Lean, well-trimmed beef, veal, pork, and lamb. Chicken and [place] without skin. All fish and shellfish. Wild duck, rabbit, pheasant, and venison. Egg whites or low-cholesterol egg substitutes. Dried beans, peas, lentils, and tofu.Seeds and most nuts. Dairy Low-fat or nonfat cheeses, including ricotta, string, and mozzarella. Skim or 1% milk that is liquid, powdered, or evaporated. Buttermilk that is made with low-fat milk. Nonfat or low-fat yogurt. Beverages Mineral water. Diet carbonated beverages. Sweets and Desserts Sherbets and fruit ices. Honey, jam, marmalade, jelly, and syrups. Meringues and gelatins. Pure sugar candy, such as hard candy, jelly beans, gumdrops, mints, marshmallows, and small amounts of dark chocolate. [employer] Eat all sweets and desserts in moderation. Fats and Oils Nonhydrogenated (trans-free) margarines. Vegetable oils, including soybean, sesame, sunflower, olive, peanut, safflower, corn, canola, and cottonseed. Salad dressings or mayonnaise that are made with a vegetable oil. Limit added fats and oils that you use for cooking, baking, salads, and as spreads. Other Cocoa powder. Coffee and tea. All seasonings and condiments. The items listed above may not be a complete list of recommended foods or beverages. Contact your dietitian for more options. What foods are not recommended? Grains Breads that are made with saturated or trans  fats, oils, or whole milk. Croissants. Butter rolls. Cheese breads. Sweet rolls. Donuts. Buttered popcorn. Chow mein noodles. High-fat crackers, such as cheese or butter crackers. Meats and Other Protein Sources Fatty meats, such as hotdogs, short ribs, sausage, spareribs, bacon, ribeye roast or steak, and mutton. High-fat deli meats, such as salami and bologna. Caviar. Domestic duck and goose. Organ meats, such as kidney, liver, sweetbreads, brains, gizzard, chitterlings, and heart. Dairy Cream, sour cream, cream cheese, and creamed cottage cheese. Whole milk cheeses, including blue (bleu), Monte[place] Jack, [place], [place], [place], [place], Swiss, [place], [place], and [place]. Whole or 2% milk that is liquid, evaporated, or condensed. Whole buttermilk. Cream sauce or high-fat cheese sauce. Yogurt that is made from whole milk. Beverages Regular sodas and drinks with added sugar. Sweets and Desserts Frosting. Pudding. Cookies. Cakes other than angel food cake. Candy that has milk chocolate or white chocolate, hydrogenated fat, butter, coconut, or unknown ingredients. Buttered syrups. Full-fat ice cream or ice cream drinks. Fats and Oils Gravy that has suet, meat fat, or shortening. Cocoa butter, hydrogenated oils, palm oil, coconut oil, palm kernel oil. These can often be found in baked products, candy, fried foods, nondairy creamers, and whipped toppings. Solid fats and shortenings, including bacon fat, salt pork, lard, and butter. Nondairy cream substitutes, such as coffee creamers and sour cream substitutes. Salad dressings that are made of unknown oils, cheese, or sour cream. The items listed above may not be a complete list of foods and beverages to avoid. Contact your dietitian for more information. This information is not intended to replace advice given to you by your health care provider. Make sure you discuss any questions you have with your health care provider. Document Released: 11/06/2007  Document Revised: 08/17/2015 Document Reviewed: 07/21/2013 Elsevier Interactive Patient Education  2017 [employer].

## 2016-09-15 NOTE — Progress Notes (Signed)
Provider: Lauree Chandler, NP  Patient Care Team: Lauree Chandler, NP as PCP - General (Nurse Practitioner) Tiajuana Amass, MD as Referring Physician (Allergy and Immunology) Wylene Simmer, MD as Consulting Physician (Orthopedic Surgery) Roseanne Kaufman, MD as Consulting Physician (Orthopedic Surgery) Rolm Bookbinder, MD as Consulting Physician (Dermatology) Gatha Mayer, MD as Consulting Physician (Gastroenterology)  Extended Emergency Contact Information Primary Emergency Contact: Charnota,Daniel Address: [address] [place] [address] Johnnette Litter of [place] Phone: [phone] Mobile Phone: [phone] Relation: Spouse Allergies  Allergen Reactions  . Morphine And Related Itching   Code Status: FULL Goals of Care: Advanced Directive information Advanced Directives 09/15/2016  Does Patient Have a Medical Advance Directive? Yes  Type of Paramedic of [place];Living will  Does patient want to make changes to medical advance directive? -  Copy of [place] in Chart? -     Chief Complaint  Patient presents with  . Medical Management of Chronic Issues    Pt is being seen for a complete physical and follow up on chronic conditions.    HPI: Patient is a 71 y.o. female seen in today for an annual wellness exam.   Major illnesses or hospitalization in the last year: None   Depression screen [facility] 2/9 09/15/2016 09/09/2016 08/23/2015 08/21/2014 01/19/2014  Decreased Interest 0 0 0 0 0  Down, Depressed, Hopeless 0 0 3 0 0  PHQ - 2 Score 0 0 3 0 0  Altered sleeping - - 0 - -  Tired, decreased energy - - 3 - -  Change in appetite - - 3 - -  Feeling bad or failure about yourself  - - 0 - -  Trouble concentrating - - 0 - -  Moving slowly or fidgety/restless - - 0 - -  Suicidal thoughts - - 0 - -  PHQ-9 Score - - 9 - -  Difficult doing work/chores - - Somewhat difficult - -    Fall Risk  09/15/2016  09/09/2016 02/25/2016 09/24/2015 08/23/2015  Falls in the past year? _0    MMSE - Mini Mental State Exam 09/09/2016 08/23/2015 08/21/2014  Orientation to time _1 Orientation to Place _2 Registration _3 Attention/ Calculation _4 Recall _5 Language- name 2 objects _6 Language- repeat _7 Language- follow 3 step command _8 Language- read & follow direction _9 Write a sentence _10 Copy design _11 Total score _12 Health Maintenance  Topic Date Due  . INFLUENZA VACCINE  09/10/2016  . Hepatitis C Screening  09/10/2023 (Originally 03/28/1945)  . MAMMOGRAM  11/13/2017  . TETANUS/TDAP  10/05/2022  . COLONOSCOPY  05/16/2026  . DEXA SCAN  Completed  . PNA vac Low Risk Adult  Completed   Dentition:routinely Pain:none  Past Medical History:  Diagnosis Date  . Allergy   . Contact dermatitis and other eczema, due to unspecified cause   . Encounter for long-term (current) use of other medications   . Hyperlipidemia   . Hypertension    been resolved >1 year.  . Other and unspecified hyperlipidemia   . Pain in joint, ankle and foot   . Screening for malignant neoplasm of the cervix     Past  Surgical History:  Procedure Laterality Date  . ANKLE SURGERY  05/2006   Right Ankle  . Carpel TUnnel Release  [id number]   [id number]), Left(2006)  . CATARACT EXTRACTION Left 10/03/14  . CATARACT EXTRACTION Right 11/16/2014  . COLONOSCOPY    . DIAGNOSTIC LAPAROSCOPY  1980  . JOINT REPLACEMENT     Total Knee(L)-11/05/07  . KNEE ARTHROPLASTY  05/2007   Left  . POLYPECTOMY    . RHINOPLASTY  1969  . TONSILLECTOMY     @ 71 y.o.  . TOTAL ANKLE ARTHROPLASTY  10/30/2011   Procedure: TOTAL ANKLE ARTHOPLASTY;  Surgeon: Wylene Simmer, MD;  Location: [facility];  Service: Orthopedics;  Laterality: Right;  RIGHT TOTAL ANKLE REPLACEMENT, POSSIBLE GASTROC RESESSION  . TUBAL LIGATION  1980    Social History   Social History  . Marital status: Married     Spouse name: N/A  . Number of children: N/A  . Years of education: N/A   Social History Main Topics  . Smoking status: Former Smoker    Packs/day: 0.50    Years: 17.00    Types: Cigarettes    Quit date: 12/17/1979  . Smokeless tobacco: Never Used  . Alcohol use 0.0 oz/week     Comment: occasional- socially   . Drug use: No  . Sexual activity: Not Currently   Other Topics Concern  . None   Social History Narrative  . None    Family History  Problem Relation Age of Onset  . Cancer Mother 53       breast  . Stomach cancer Maternal Grandfather   . Colon cancer Paternal Grandfather   . Esophageal cancer Neg Hx   . Rectal cancer Neg Hx     Review of Systems:  Review of Systems  Constitutional: Negative for activity change, appetite change, fatigue and unexpected weight change.  Eyes: Negative.   Respiratory: Negative for cough and shortness of breath.   Cardiovascular: Negative for chest pain, palpitations and leg swelling.  Gastrointestinal: Negative for constipation and diarrhea.  Genitourinary: Negative for difficulty urinating and dysuria.  Musculoskeletal: Negative for arthralgias (chronic knee pain. no current pain and not on medication at this time) and myalgias.       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   Skin: Negative for color change and wound.  Neurological: Negative for dizziness and weakness.  Psychiatric/Behavioral: Negative for agitation, behavioral problems and confusion.       Depression improved on Wellbutrin      Allergies as of 09/15/2016      Reactions   Morphine And Related Itching      Medication List       Accurate as of 09/15/16 11:26 AM. Always use your most recent med list.          aspirin EC 81 MG tablet Take 81 mg by mouth daily.   buPROPion 150 MG 24 hr tablet Commonly known as:  WELLBUTRIN XL Take 1 tablet (150 mg total) by mouth daily.   cholecalciferol 1000 units tablet Commonly known as:   VITAMIN D Take 1 tablet (1,000 Units total) by mouth daily.   famotidine 20 MG tablet Commonly known as:  PEPCID Take 20 mg by mouth daily as needed for heartburn or indigestion.   levocetirizine 5 MG tablet Commonly known as:  XYZAL Take 5 mg by mouth daily.   montelukast 10 MG tablet Commonly known as:  SINGULAIR Take 10 mg by mouth at bedtime.  multivitamin with minerals Tabs tablet Take 1 tablet by mouth daily.   naproxen sodium 220 MG tablet Commonly known as:  ANAPROX Take 220 mg by mouth daily as needed.   Zoster Vac Recomb Adjuvanted injection Commonly known as:  SHINGRIX Inject 0.5 mLs into the muscle once.         Physical Exam: Vitals:   09/15/16 1104  BP: 124/86  Pulse: 85  Resp: 18  Temp: 97.6 F (36.4 C)  TempSrc: Oral  SpO2: 97%  Weight: 159 lb 12.8 oz (72.5 kg)  Height: _0  (1.575 m)   Body mass index is 29.23 kg/m. Physical Exam  Constitutional: She is oriented to person, place, and time. She appears well-developed and well-nourished. No distress.  HENT:  Head: Normocephalic and atraumatic.  Mouth/Throat: Oropharynx is clear and moist. No oropharyngeal exudate.  Eyes: Pupils are equal, round, and reactive to light. Conjunctivae are normal.  Neck: Normal range of motion. Neck supple.  Cardiovascular: Normal rate, regular rhythm and normal heart sounds.   Pulmonary/Chest: Effort normal and breath sounds normal.  Abdominal: Soft. Bowel sounds are normal.  Musculoskeletal: Normal range of motion. She exhibits no edema, tenderness or deformity.  Neurological: She is alert and oriented to person, place, and time. No cranial nerve deficit. Coordination normal.  Skin: Skin is warm and dry. She is not diaphoretic.  Psychiatric: She has a normal mood and affect.    Labs reviewed: Basic Metabolic Panel:  Recent Labs  09/24/15 1103 02/25/16 0923  NA  --  140  K  --  5.0  CL  --  103  CO2  --  28  GLUCOSE  --  91  BUN  --  15    CREATININE  --  0.90  CALCIUM  --  9.5  TSH 1.84  --    Liver Function Tests:  Recent Labs  02/25/16 0923  AST 24  ALT 20  ALKPHOS 74  BILITOT 0.5  PROT 6.8  ALBUMIN 4.2   No results for input(s): LIPASE, AMYLASE in the last 8760 hours. No results for input(s): AMMONIA in the last 8760 hours. CBC:  Recent Labs  02/25/16 0923  WBC 6.6  NEUTROABS 3,696  HGB 14.3  HCT 43.6  MCV 90.1  PLT 256   Lipid Panel:  Recent Labs  02/25/16 0923  CHOL 201*  HDL 59  LDLCALC 118*  TRIG 120  CHOLHDL 3.4   No results found for: HGBA1C  Procedures: No results found.  Assessment/Plan 1. Depression, unspecified depression type In remission on bupropion.  - buPROPion (WELLBUTRIN XL) 150 MG 24 hr tablet; Take 1 tablet (150 mg total) by mouth daily.  Dispense: 90 tablet; Refill: 1  2. Essential hypertension Blood pressure controlled. Not requiring medication, cont diet and exercise modifications.  - EKG 12-Lead- normal sinus rhythm, rate 73 bpm, poor R wave progression with left axis deviation, similar to 2015 EKG.   3. Wellness examination Doing well. Traveling. Great mood without depression or anxiety.  PREVENTIVE COUNSELING:  The patient was counseled regarding the appropriate use of alcohol, regular self-examination of the breasts on a monthly basis, prevention of dental and periodontal disease, diet, regular sustained exercise for at least 30 minutes 5 times per week, routine screening interval for mammogram as recommended by the [place] and ACOG, tobacco use,  and recommended schedule for GI hemoccult testing, colonoscopy, cholesterol, thyroid and diabetes screening. - CMP with eGFR; Future - CBC with Differential/Platelets; Future  4. Hyperlipidemia,  unspecified hyperlipidemia type -diet controlled. Cont heart healthy lifestyle with diet and exercise.  - Lipid Panel; Future  5. Generalized osteoarthrosis, involving multiple sites Doing well at this  time. Currently not taking any medication routinely and not having any pain.  6. Gastroesophageal reflux disease without esophagitis Worse when her diet is poor, diet controlled but uses famotidine occasionally as needed   Next appt: 6 month with Dr Mariea Clonts for routine follow up, 1 year for CPE Mira Balon K. Harle Battiest  [facility] [phone] 8 am - 5 pm) [phone] (after hours)

## 2016-09-16 DIAGNOSIS — J301 Allergic rhinitis due to pollen: Secondary | ICD-10-CM | POA: Diagnosis not present

## 2016-09-16 DIAGNOSIS — J3089 Other allergic rhinitis: Secondary | ICD-10-CM | POA: Diagnosis not present

## 2016-09-16 DIAGNOSIS — J3081 Allergic rhinitis due to animal (cat) (dog) hair and dander: Secondary | ICD-10-CM | POA: Diagnosis not present

## 2016-09-18 ENCOUNTER — Other Ambulatory Visit: Payer: Medicare Other

## 2016-09-18 DIAGNOSIS — J3081 Allergic rhinitis due to animal (cat) (dog) hair and dander: Secondary | ICD-10-CM | POA: Diagnosis not present

## 2016-09-18 DIAGNOSIS — Z0000 Encounter for general adult medical examination without abnormal findings: Secondary | ICD-10-CM

## 2016-09-18 DIAGNOSIS — J301 Allergic rhinitis due to pollen: Secondary | ICD-10-CM | POA: Diagnosis not present

## 2016-09-18 LAB — COMPLETE METABOLIC PANEL WITH GFR
ALBUMIN: 3.9 g/dL (ref 3.6–5.1)
ALK PHOS: 73 U/L (ref 33–130)
ALT: 14 U/L (ref 6–29)
AST: 17 U/L (ref 10–35)
BILIRUBIN TOTAL: 0.4 mg/dL (ref 0.2–1.2)
BUN: 21 mg/dL (ref 7–25)
CO2: 28 mmol/L (ref 20–32)
CREATININE: 0.93 mg/dL (ref 0.60–0.93)
Calcium: 9 mg/dL (ref 8.6–10.4)
Chloride: 103 mmol/L (ref 98–110)
GFR, EST NON AFRICAN AMERICAN: 61 mL/min (ref >=60)
GFR, Est African American: 71 mL/min (ref >=60)
GLUCOSE: 85 mg/dL (ref 65–99)
Potassium: 4.5 mmol/L (ref 3.5–5.3)
SODIUM: 139 mmol/L (ref 135–146)
TOTAL PROTEIN: 6.3 g/dL (ref 6.1–8.1)

## 2016-09-18 LAB — CBC WITH DIFFERENTIAL/PLATELET
BASOS ABS: 0 {cells}/uL (ref 0–200)
Basophils Relative: 0 %
EOS ABS: 165 {cells}/uL (ref 15–500)
EOS PCT: 3 %
HCT: 43.5 % (ref 35.0–45.0)
Hemoglobin: 14.1 g/dL (ref 11.7–15.5)
LYMPHS PCT: 35 %
Lymphs Abs: 1925 cells/uL (ref 850–3900)
MCH: 29.1 pg (ref 27.0–33.0)
MCHC: 32.4 g/dL (ref 32.0–36.0)
MCV: 89.9 fL (ref 80.0–100.0)
MONOS PCT: 6 %
MPV: 9.9 fL (ref 7.5–12.5)
Monocytes Absolute: 330 cells/uL (ref 200–950)
NEUTROS ABS: 3080 {cells}/uL (ref 1500–7800)
NEUTROS PCT: 56 %
PLATELETS: 235 10*3/uL (ref 140–400)
RBC: 4.84 MIL/uL (ref 3.80–5.10)
RDW: 13.6 % (ref 11.0–15.0)
WBC: 5.5 10*3/uL (ref 3.8–10.8)

## 2016-09-18 LAB — LIPID PANEL
Cholesterol: 208 mg/dL — ABNORMAL HIGH (ref <200)
HDL: 60 mg/dL (ref >50)
LDL CALC: 128 mg/dL — AB (ref <100)
Total CHOL/HDL Ratio: 3.5 Ratio (ref <5.0)
Triglycerides: 100 mg/dL (ref <150)
VLDL: 20 mg/dL (ref <30)

## 2016-09-23 DIAGNOSIS — J3081 Allergic rhinitis due to animal (cat) (dog) hair and dander: Secondary | ICD-10-CM | POA: Diagnosis not present

## 2016-09-23 DIAGNOSIS — J301 Allergic rhinitis due to pollen: Secondary | ICD-10-CM | POA: Diagnosis not present

## 2016-09-23 DIAGNOSIS — J3089 Other allergic rhinitis: Secondary | ICD-10-CM | POA: Diagnosis not present

## 2016-09-30 DIAGNOSIS — J3081 Allergic rhinitis due to animal (cat) (dog) hair and dander: Secondary | ICD-10-CM | POA: Diagnosis not present

## 2016-09-30 DIAGNOSIS — J3089 Other allergic rhinitis: Secondary | ICD-10-CM | POA: Diagnosis not present

## 2016-09-30 DIAGNOSIS — J301 Allergic rhinitis due to pollen: Secondary | ICD-10-CM | POA: Diagnosis not present

## 2016-10-07 DIAGNOSIS — J3089 Other allergic rhinitis: Secondary | ICD-10-CM | POA: Diagnosis not present

## 2016-10-07 DIAGNOSIS — J3081 Allergic rhinitis due to animal (cat) (dog) hair and dander: Secondary | ICD-10-CM | POA: Diagnosis not present

## 2016-10-07 DIAGNOSIS — J301 Allergic rhinitis due to pollen: Secondary | ICD-10-CM | POA: Diagnosis not present

## 2016-10-10 DIAGNOSIS — J3081 Allergic rhinitis due to animal (cat) (dog) hair and dander: Secondary | ICD-10-CM | POA: Diagnosis not present

## 2016-10-10 DIAGNOSIS — J301 Allergic rhinitis due to pollen: Secondary | ICD-10-CM | POA: Diagnosis not present

## 2016-10-16 DIAGNOSIS — J3089 Other allergic rhinitis: Secondary | ICD-10-CM | POA: Diagnosis not present

## 2016-10-16 DIAGNOSIS — J301 Allergic rhinitis due to pollen: Secondary | ICD-10-CM | POA: Diagnosis not present

## 2016-10-16 DIAGNOSIS — J3081 Allergic rhinitis due to animal (cat) (dog) hair and dander: Secondary | ICD-10-CM | POA: Diagnosis not present

## 2016-10-17 DIAGNOSIS — J3089 Other allergic rhinitis: Secondary | ICD-10-CM | POA: Diagnosis not present

## 2016-10-21 DIAGNOSIS — J3081 Allergic rhinitis due to animal (cat) (dog) hair and dander: Secondary | ICD-10-CM | POA: Diagnosis not present

## 2016-10-21 DIAGNOSIS — J3089 Other allergic rhinitis: Secondary | ICD-10-CM | POA: Diagnosis not present

## 2016-10-21 DIAGNOSIS — J301 Allergic rhinitis due to pollen: Secondary | ICD-10-CM | POA: Diagnosis not present

## 2016-10-28 DIAGNOSIS — J3081 Allergic rhinitis due to animal (cat) (dog) hair and dander: Secondary | ICD-10-CM | POA: Diagnosis not present

## 2016-10-28 DIAGNOSIS — J3089 Other allergic rhinitis: Secondary | ICD-10-CM | POA: Diagnosis not present

## 2016-10-28 DIAGNOSIS — J301 Allergic rhinitis due to pollen: Secondary | ICD-10-CM | POA: Diagnosis not present

## 2016-11-04 DIAGNOSIS — J3089 Other allergic rhinitis: Secondary | ICD-10-CM | POA: Diagnosis not present

## 2016-11-04 DIAGNOSIS — J301 Allergic rhinitis due to pollen: Secondary | ICD-10-CM | POA: Diagnosis not present

## 2016-11-04 DIAGNOSIS — J3081 Allergic rhinitis due to animal (cat) (dog) hair and dander: Secondary | ICD-10-CM | POA: Diagnosis not present

## 2016-11-11 DIAGNOSIS — J301 Allergic rhinitis due to pollen: Secondary | ICD-10-CM | POA: Diagnosis not present

## 2016-11-11 DIAGNOSIS — J3089 Other allergic rhinitis: Secondary | ICD-10-CM | POA: Diagnosis not present

## 2016-11-11 DIAGNOSIS — J3081 Allergic rhinitis due to animal (cat) (dog) hair and dander: Secondary | ICD-10-CM | POA: Diagnosis not present

## 2016-11-18 DIAGNOSIS — J3089 Other allergic rhinitis: Secondary | ICD-10-CM | POA: Diagnosis not present

## 2016-11-18 DIAGNOSIS — J301 Allergic rhinitis due to pollen: Secondary | ICD-10-CM | POA: Diagnosis not present

## 2016-11-18 DIAGNOSIS — J3081 Allergic rhinitis due to animal (cat) (dog) hair and dander: Secondary | ICD-10-CM | POA: Diagnosis not present

## 2016-11-23 DIAGNOSIS — Z23 Encounter for immunization: Secondary | ICD-10-CM | POA: Diagnosis not present

## 2016-11-25 DIAGNOSIS — J3089 Other allergic rhinitis: Secondary | ICD-10-CM | POA: Diagnosis not present

## 2016-11-25 DIAGNOSIS — J301 Allergic rhinitis due to pollen: Secondary | ICD-10-CM | POA: Diagnosis not present

## 2016-11-25 DIAGNOSIS — J3081 Allergic rhinitis due to animal (cat) (dog) hair and dander: Secondary | ICD-10-CM | POA: Diagnosis not present

## 2016-12-02 DIAGNOSIS — J3081 Allergic rhinitis due to animal (cat) (dog) hair and dander: Secondary | ICD-10-CM | POA: Diagnosis not present

## 2016-12-02 DIAGNOSIS — J301 Allergic rhinitis due to pollen: Secondary | ICD-10-CM | POA: Diagnosis not present

## 2016-12-02 DIAGNOSIS — J3089 Other allergic rhinitis: Secondary | ICD-10-CM | POA: Diagnosis not present

## 2016-12-04 DIAGNOSIS — D485 Neoplasm of uncertain behavior of skin: Secondary | ICD-10-CM | POA: Diagnosis not present

## 2016-12-04 DIAGNOSIS — D225 Melanocytic nevi of trunk: Secondary | ICD-10-CM | POA: Diagnosis not present

## 2016-12-04 DIAGNOSIS — D2262 Melanocytic nevi of left upper limb, including shoulder: Secondary | ICD-10-CM | POA: Diagnosis not present

## 2016-12-04 DIAGNOSIS — Z85828 Personal history of other malignant neoplasm of skin: Secondary | ICD-10-CM | POA: Diagnosis not present

## 2016-12-04 DIAGNOSIS — D1801 Hemangioma of skin and subcutaneous tissue: Secondary | ICD-10-CM | POA: Diagnosis not present

## 2016-12-04 DIAGNOSIS — L821 Other seborrheic keratosis: Secondary | ICD-10-CM | POA: Diagnosis not present

## 2016-12-04 DIAGNOSIS — L814 Other melanin hyperpigmentation: Secondary | ICD-10-CM | POA: Diagnosis not present

## 2016-12-04 DIAGNOSIS — D2239 Melanocytic nevi of other parts of face: Secondary | ICD-10-CM | POA: Diagnosis not present

## 2016-12-11 DIAGNOSIS — J3081 Allergic rhinitis due to animal (cat) (dog) hair and dander: Secondary | ICD-10-CM | POA: Diagnosis not present

## 2016-12-11 DIAGNOSIS — J301 Allergic rhinitis due to pollen: Secondary | ICD-10-CM | POA: Diagnosis not present

## 2016-12-11 DIAGNOSIS — J3089 Other allergic rhinitis: Secondary | ICD-10-CM | POA: Diagnosis not present

## 2016-12-23 DIAGNOSIS — J3089 Other allergic rhinitis: Secondary | ICD-10-CM | POA: Diagnosis not present

## 2016-12-23 DIAGNOSIS — J301 Allergic rhinitis due to pollen: Secondary | ICD-10-CM | POA: Diagnosis not present

## 2016-12-23 DIAGNOSIS — J3081 Allergic rhinitis due to animal (cat) (dog) hair and dander: Secondary | ICD-10-CM | POA: Diagnosis not present

## 2017-01-06 DIAGNOSIS — J3089 Other allergic rhinitis: Secondary | ICD-10-CM | POA: Diagnosis not present

## 2017-01-06 DIAGNOSIS — J301 Allergic rhinitis due to pollen: Secondary | ICD-10-CM | POA: Diagnosis not present

## 2017-01-06 DIAGNOSIS — J3081 Allergic rhinitis due to animal (cat) (dog) hair and dander: Secondary | ICD-10-CM | POA: Diagnosis not present

## 2017-01-27 DIAGNOSIS — J3081 Allergic rhinitis due to animal (cat) (dog) hair and dander: Secondary | ICD-10-CM | POA: Diagnosis not present

## 2017-01-27 DIAGNOSIS — J3089 Other allergic rhinitis: Secondary | ICD-10-CM | POA: Diagnosis not present

## 2017-01-27 DIAGNOSIS — J301 Allergic rhinitis due to pollen: Secondary | ICD-10-CM | POA: Diagnosis not present

## 2017-02-06 ENCOUNTER — Other Ambulatory Visit: Payer: Self-pay | Admitting: Nurse Practitioner

## 2017-02-06 DIAGNOSIS — Z1231 Encounter for screening mammogram for malignant neoplasm of breast: Secondary | ICD-10-CM

## 2017-02-17 DIAGNOSIS — J3089 Other allergic rhinitis: Secondary | ICD-10-CM | POA: Diagnosis not present

## 2017-02-17 DIAGNOSIS — J301 Allergic rhinitis due to pollen: Secondary | ICD-10-CM | POA: Diagnosis not present

## 2017-02-17 DIAGNOSIS — J3081 Allergic rhinitis due to animal (cat) (dog) hair and dander: Secondary | ICD-10-CM | POA: Diagnosis not present

## 2017-02-26 DIAGNOSIS — M1711 Unilateral primary osteoarthritis, right knee: Secondary | ICD-10-CM | POA: Diagnosis not present

## 2017-03-03 ENCOUNTER — Ambulatory Visit
Admission: RE | Admit: 2017-03-03 | Discharge: 2017-03-03 | Disposition: A | Discharge status: Home / Self Care | Payer: Medicare Other | Source: Ambulatory Visit | Attending: Nurse Practitioner | Admitting: Nurse Practitioner

## 2017-03-03 DIAGNOSIS — Z1231 Encounter for screening mammogram for malignant neoplasm of breast: Secondary | ICD-10-CM | POA: Diagnosis not present

## 2017-03-19 ENCOUNTER — Ambulatory Visit: Payer: Medicare Other | Admitting: Internal Medicine

## 2017-03-23 DIAGNOSIS — M13841 Other specified arthritis, right hand: Secondary | ICD-10-CM | POA: Diagnosis not present

## 2017-03-23 DIAGNOSIS — M79644 Pain in right finger(s): Secondary | ICD-10-CM | POA: Diagnosis not present

## 2017-04-06 ENCOUNTER — Other Ambulatory Visit: Payer: Self-pay | Admitting: Nurse Practitioner

## 2017-04-06 DIAGNOSIS — F32A Depression, unspecified: Secondary | ICD-10-CM

## 2017-04-06 DIAGNOSIS — F329 Major depressive disorder, single episode, unspecified: Secondary | ICD-10-CM

## 2017-04-30 ENCOUNTER — Encounter: Payer: Self-pay | Admitting: Internal Medicine

## 2017-04-30 ENCOUNTER — Ambulatory Visit (INDEPENDENT_AMBULATORY_CARE_PROVIDER_SITE_OTHER): Payer: Medicare Other | Admitting: Internal Medicine

## 2017-04-30 VITALS — BP 130/80 | HR 69 | Temp 97.9°F | Ht 62.0 in | Wt 169.2 lb

## 2017-04-30 DIAGNOSIS — M85851 Other specified disorders of bone density and structure, right thigh: Secondary | ICD-10-CM

## 2017-04-30 DIAGNOSIS — F329 Major depressive disorder, single episode, unspecified: Secondary | ICD-10-CM

## 2017-04-30 DIAGNOSIS — I10 Essential (primary) hypertension: Secondary | ICD-10-CM | POA: Diagnosis not present

## 2017-04-30 DIAGNOSIS — E559 Vitamin D deficiency, unspecified: Secondary | ICD-10-CM

## 2017-04-30 DIAGNOSIS — M85852 Other specified disorders of bone density and structure, left thigh: Secondary | ICD-10-CM

## 2017-04-30 DIAGNOSIS — E785 Hyperlipidemia, unspecified: Secondary | ICD-10-CM

## 2017-04-30 DIAGNOSIS — K219 Gastro-esophageal reflux disease without esophagitis: Secondary | ICD-10-CM

## 2017-04-30 DIAGNOSIS — E669 Obesity, unspecified: Secondary | ICD-10-CM | POA: Diagnosis not present

## 2017-04-30 DIAGNOSIS — F32A Depression, unspecified: Secondary | ICD-10-CM

## 2017-04-30 DIAGNOSIS — M159 Polyosteoarthritis, unspecified: Secondary | ICD-10-CM | POA: Diagnosis not present

## 2017-04-30 NOTE — Progress Notes (Signed)
Location:  [facility] clinic Provider:  Jenefer Woerner L. Mariea Clonts, D.O., C.M.D.  Code Status: full code Goals of Care:  Advanced Directives 09/15/2016  Does Patient Have a Medical Advance Directive? Yes  Type of Paramedic of [place];Living will  Does patient want to make changes to medical advance directive? -  Copy of [place] in Chart? -   Chief Complaint  Patient presents with  . Medical Management of Chronic Issues    6 month follow-up  . Medication Refill    No Refills  . Health Maintenance    Flu vaccine up-to-date  . Advance Care Planning    Patient has HPOA and Living Will     HPI: Patient is a 72 y.o. female seen today for medical management of chronic diseases.  She sees [name], NP as her PCP here.  She has a h/o complex regional pain syndrome of her lower limb, generalized OA, hyperlipidemia, overweight, allergic rhinitis, htn, osteopenia, map-dot-fingerprint corneal dystrophy, eczema, and postmenopausal vaginal dryness.  Has not had any colds, allergy flare-ups.  Feels very healthy.    Depression:  In remission with bupropion.  Reports being more down in winter.  If she's down, she eats.  When she can get out and do yardwork, that helps her spirits.    HTN:  BP is well-controlled.    HL:  LDL elevated at 128, but not diabetic and no known CAD.  HDL good at 60.  Reports LDL will be better in 3-4 mos on weight watchers.  Best weight here was 155 lbs in 2015.    Allergies:  On singulair and xyzal, pepcid for reflux.  No flare-ups.    Pain:  On celebrex 200mg  daily since January of this year.  Right knee has been bothersome--she rubs it.  Had a cortisone shot with [facility]. Last week she overdid it.  Is off aleve.  It stays tight.  Has had prior knee and ankle replacements and a lot of cortisone shots.  Saw Dr. Amedeo Plenty in Dec for her [facility] joint arthritis.  Only bothers her occasionally on right.  She's lefthanded.  Overweight:  Wt up  10 lbs since last visit in August.  BMI now in obese range.  Reports that happens each winter.  She's now back to weight watchers.  Always feels better.  If she eats bread and sweets, then reflux develops.  Reports she's been having this problem for years and has been on and off weight watchers.  She travels a lot which is a challenge.  Rarely uses her 23 points.  She is exercising also--[facility]  Stays fairly active--shows and trains dogs.  She hunts also.    Know she needs to get her shingrix vaccine.    Past Medical History:  Diagnosis Date  . Allergy   . Contact dermatitis and other eczema, due to unspecified cause   . Encounter for long-term (current) use of other medications   . Hyperlipidemia   . Hypertension    been resolved >1 year.  . Other and unspecified hyperlipidemia   . Pain in joint, ankle and foot   . Screening for malignant neoplasm of the cervix     Past Surgical History:  Procedure Laterality Date  . ANKLE SURGERY  05/2006   Right Ankle  . Carpel TUnnel Release  [id number]   [id number]), Left(2006)  . CATARACT EXTRACTION Left 10/03/14  . CATARACT EXTRACTION Right 11/16/2014  .  COLONOSCOPY    . DIAGNOSTIC LAPAROSCOPY  1980  . JOINT REPLACEMENT     Total Knee(L)-11/05/07  . KNEE ARTHROPLASTY  05/2007   Left  . POLYPECTOMY    . RHINOPLASTY  1969  . TONSILLECTOMY     @ 72 y.o.  . TOTAL ANKLE ARTHROPLASTY  10/30/2011   Procedure: TOTAL ANKLE ARTHOPLASTY;  Surgeon: Wylene Simmer, MD;  Location: [facility];  Service: Orthopedics;  Laterality: Right;  RIGHT TOTAL ANKLE REPLACEMENT, POSSIBLE GASTROC RESESSION  . TUBAL LIGATION  1980    Allergies  Allergen Reactions  . Morphine And Related Itching    Outpatient Encounter Medications as of 04/30/2017  Medication Sig  . aspirin EC 81 MG tablet Take 81 mg by mouth daily.  Marland Kitchen buPROPion (WELLBUTRIN XL) 150 MG 24 hr tablet TAKE ONE TABLET BY MOUTH ONE TIME DAILY   . celecoxib (CELEBREX) 200 MG  capsule 1 by mouth daily  . cholecalciferol (VITAMIN D) 1000 UNITS tablet Take 1 tablet (1,000 Units total) by mouth daily.  . famotidine (PEPCID) 20 MG tablet Take 20 mg by mouth daily as needed for heartburn or indigestion.  Marland Kitchen levocetirizine (XYZAL) 5 MG tablet Take 5 mg by mouth daily.  . montelukast (SINGULAIR) 10 MG tablet Take 10 mg by mouth at bedtime.  . Multiple Vitamin (MULTIVITAMIN WITH MINERALS) TABS Take 1 tablet by mouth daily.  . [DISCONTINUED] naproxen sodium (ANAPROX) 220 MG tablet Take 220 mg by mouth daily as needed.   Facility-Administered Encounter Medications as of 04/30/2017  Medication  . 0.9 %  sodium chloride infusion    Review of Systems:  Review of Systems  Constitutional: Negative for chills, diaphoresis, fever and malaise/fatigue.  HENT: Negative for congestion and hearing loss.   Eyes: Negative for blurred vision.  Respiratory: Negative for cough and shortness of breath.   Cardiovascular: Negative for chest pain, palpitations and leg swelling.  Gastrointestinal: Negative for abdominal pain, blood in stool, constipation, diarrhea and melena.  Genitourinary: Negative for dysuria.  Musculoskeletal: Positive for joint pain. Negative for falls.  Skin: Negative for itching and rash.  Neurological: Negative for dizziness and loss of consciousness.  Endo/Heme/Allergies: Positive for environmental allergies. Does not bruise/bleed easily.  Psychiatric/Behavioral: Negative for depression and memory loss. The patient is not nervous/anxious and does not have insomnia.     Health Maintenance  Topic Date Due  . Hepatitis C Screening  09/10/2023 (Originally [birth year])  . MAMMOGRAM  03/04/2019  . TETANUS/TDAP  10/05/2022  . COLONOSCOPY  05/16/2026  . INFLUENZA VACCINE  Completed  . DEXA SCAN  Completed  . PNA vac Low Risk Adult  Completed    Physical Exam: Vitals:   04/30/17 0754  BP: 130/80  Pulse: 69  Temp: 97.9 F (36.6 C)  TempSrc: Oral  SpO2: 97%    Weight: 169 lb 3.2 oz (76.7 kg)  Height: 5\' 2"  (1.575 m)   Body mass index is 30.95 kg/m. Physical Exam  Constitutional: She is oriented to person, place, and time. She appears well-developed and well-nourished. No distress.  HENT:  Head: Normocephalic and atraumatic.  Cardiovascular: Normal rate, regular rhythm, normal heart sounds and intact distal pulses.  Pulmonary/Chest: Effort normal and breath sounds normal. No respiratory distress.  Abdominal: Bowel sounds are normal.  Musculoskeletal: Normal range of motion.  Mild scoliosis noted; right thumb joint deformity  Neurological: She is alert and oriented to person, place, and time. No cranial nerve deficit. Coordination normal.  Skin: Skin is warm and dry. Capillary refill  takes less than 2 seconds.  Psychiatric: She has a normal mood and affect.    Labs reviewed: Basic Metabolic Panel: Recent Labs    09/18/16 0820  NA 139  K 4.5  CL 103  CO2 28  GLUCOSE 85  BUN 21  CREATININE 0.93  CALCIUM 9.0   Liver Function Tests: Recent Labs    09/18/16 0820  AST 17  ALT 14  ALKPHOS 73  BILITOT 0.4  PROT 6.3  ALBUMIN 3.9   No results for input(s): LIPASE, AMYLASE in the last 8760 hours. No results for input(s): AMMONIA in the last 8760 hours. CBC: Recent Labs    09/18/16 0820  WBC 5.5  NEUTROABS 3,080  HGB 14.1  HCT 43.5  MCV 89.9  PLT 235   Lipid Panel: Recent Labs    09/18/16 0820  CHOL 208*  HDL 60  LDLCALC 128*  TRIG 100  CHOLHDL 3.5   Assessment/Plan 1. Obesity (BMI 30.0-34.9) -weight up 10 lbs after winter, is back on weight watchers diet and exercise routine  2. Depression, unspecified depression type -doing better in spring and on wellbutrin, cont same and monitor  3. Essential hypertension -bp well controlled  4. Hyperlipidemia, unspecified hyperlipidemia type -LDL higher with weight gain, cont to work on diet and exercise  5. Generalized osteoarthrosis, involving multiple  sites -stable on celebrex--is taking with food as directed -monitor renal function  6. Gastroesophageal reflux disease without esophagitis -cont pepcid prn, but gets better with improved diet  7. Vitamin D deficiency -cont ca with D and additional D3 she's taking due to this and #8  8. Osteopenia of necks of both femurs -cont weightbearing exercise and vitamins as above  Labs/tests ordered:   Orders Placed This Encounter  Procedures  . CBC with Differential/Platelet    Standing Status:   Future    Standing Expiration Date:   05/01/2018  . COMPLETE METABOLIC PANEL WITH GFR    Standing Status:   Future    Standing Expiration Date:   05/01/2018  . Lipid panel    Standing Status:   Future    Standing Expiration Date:   05/01/2018     Next appt:  6 mos with Janett Billow for med [place], labs before; needs AWV with Clarise Cruz scheduled also  Josiyah Tozzi L. Stalin Gruenberg, D.O. [facility] [address], [place] [address] Cell Phone (Mon-Fri 8am-5pm):  [phone] On Call:  [phone] & follow prompts after 5pm & weekends Office Phone:  [phone] Office Fax:  [phone]

## 2017-05-06 DIAGNOSIS — M25561 Pain in right knee: Secondary | ICD-10-CM | POA: Diagnosis not present

## 2017-06-03 DIAGNOSIS — M25561 Pain in right knee: Secondary | ICD-10-CM | POA: Diagnosis not present

## 2017-06-04 ENCOUNTER — Other Ambulatory Visit: Payer: Self-pay | Admitting: Orthopedic Surgery

## 2017-06-04 DIAGNOSIS — J3089 Other allergic rhinitis: Secondary | ICD-10-CM | POA: Diagnosis not present

## 2017-06-04 DIAGNOSIS — M25561 Pain in right knee: Secondary | ICD-10-CM

## 2017-06-04 DIAGNOSIS — J3081 Allergic rhinitis due to animal (cat) (dog) hair and dander: Secondary | ICD-10-CM | POA: Diagnosis not present

## 2017-06-04 DIAGNOSIS — J301 Allergic rhinitis due to pollen: Secondary | ICD-10-CM | POA: Diagnosis not present

## 2017-06-09 ENCOUNTER — Ambulatory Visit
Admission: RE | Admit: 2017-06-09 | Discharge: 2017-06-09 | Disposition: A | Discharge status: Home / Self Care | Payer: Medicare Other | Source: Ambulatory Visit | Attending: Orthopedic Surgery | Admitting: Orthopedic Surgery

## 2017-06-09 DIAGNOSIS — M23300 Other meniscus derangements, unspecified lateral meniscus, right knee: Secondary | ICD-10-CM | POA: Diagnosis not present

## 2017-06-09 DIAGNOSIS — M25561 Pain in right knee: Secondary | ICD-10-CM

## 2017-06-09 DIAGNOSIS — M23304 Other meniscus derangements, unspecified medial meniscus, left knee: Secondary | ICD-10-CM | POA: Diagnosis not present

## 2017-07-01 DIAGNOSIS — S83231A Complex tear of medial meniscus, current injury, right knee, initial encounter: Secondary | ICD-10-CM | POA: Diagnosis not present

## 2017-07-01 DIAGNOSIS — S83271A Complex tear of lateral meniscus, current injury, right knee, initial encounter: Secondary | ICD-10-CM | POA: Diagnosis not present

## 2017-07-01 DIAGNOSIS — Y999 Unspecified external cause status: Secondary | ICD-10-CM | POA: Diagnosis not present

## 2017-07-01 DIAGNOSIS — G8918 Other acute postprocedural pain: Secondary | ICD-10-CM | POA: Diagnosis not present

## 2017-07-01 DIAGNOSIS — X58XXXA Exposure to other specified factors, initial encounter: Secondary | ICD-10-CM | POA: Diagnosis not present

## 2017-07-01 DIAGNOSIS — M659 Synovitis and tenosynovitis, unspecified: Secondary | ICD-10-CM | POA: Diagnosis not present

## 2017-07-01 DIAGNOSIS — M94261 Chondromalacia, right knee: Secondary | ICD-10-CM | POA: Diagnosis not present

## 2017-07-10 ENCOUNTER — Other Ambulatory Visit: Payer: Self-pay | Admitting: Nurse Practitioner

## 2017-07-10 DIAGNOSIS — F329 Major depressive disorder, single episode, unspecified: Secondary | ICD-10-CM

## 2017-07-10 DIAGNOSIS — F32A Depression, unspecified: Secondary | ICD-10-CM

## 2017-07-14 ENCOUNTER — Telehealth: Payer: Self-pay | Admitting: Nurse Practitioner

## 2017-07-14 NOTE — Telephone Encounter (Signed)
Left msg asking pt to confirm this AWV-S w/ Sara. Last AWV 09/09/16. VDM (DD) °

## 2017-07-22 DIAGNOSIS — Z961 Presence of intraocular lens: Secondary | ICD-10-CM | POA: Diagnosis not present

## 2017-08-03 NOTE — Telephone Encounter (Signed)
Left msg asking pt to confirm this AWV-S w/ Clarise Cruz. Last AWV 09/09/16. VDM (DD)

## 2017-08-19 ENCOUNTER — Ambulatory Visit (HOSPITAL_COMMUNITY): Payer: Medicare Other | Attending: Orthopedic Surgery

## 2017-08-19 DIAGNOSIS — M25661 Stiffness of right knee, not elsewhere classified: Secondary | ICD-10-CM | POA: Insufficient documentation

## 2017-08-19 DIAGNOSIS — M25561 Pain in right knee: Secondary | ICD-10-CM | POA: Diagnosis not present

## 2017-08-19 NOTE — Therapy (Signed)
[place], [place], [address] Phone: [phone]   Fax:  [phone]  Physical Therapy Evaluation  Patient Details  Name: [name] MRN: [id number] Date of Birth: April 18, 1945 Referring Provider: Netta Cedars, MD    Encounter Date: 08/19/2017  PT End of Session - 08/19/17 1313    Visit Number  1    Number of Visits  16    Date for PT Re-Evaluation  10/19/17    Authorization Type  [employer]; with secondary: 60 visits total for PT/OT/ST     Authorization Time Period  Eval on 08/19/17, reassessment on 09/19/17; ReEval on 10/20/17    PT Start Time  1118    PT Stop Time  1210    PT Time Calculation (min)  52 min    Activity Tolerance  Patient tolerated treatment well;Patient limited by pain;Patient limited by fatigue    Behavior During Therapy  [facility] for tasks assessed/performed       Past Medical History:  Diagnosis Date  . Allergy   . Contact dermatitis and other eczema, due to unspecified cause   . Encounter for long-term (current) use of other medications   . Hyperlipidemia   . Hypertension    been resolved >1 year.  . Other and unspecified hyperlipidemia   . Pain in joint, ankle and foot   . Screening for malignant neoplasm of the cervix     Past Surgical History:  Procedure Laterality Date  . ANKLE SURGERY  05/2006   Right Ankle  . Carpel TUnnel Release  [id number]   [id number]), Left(2006)  . CATARACT EXTRACTION Left 10/03/14  . CATARACT EXTRACTION Right 11/16/2014  . COLONOSCOPY    . DIAGNOSTIC LAPAROSCOPY  1980  . JOINT REPLACEMENT     Total Knee(L)-11/05/07  . KNEE ARTHROPLASTY  05/2007   Left  . POLYPECTOMY    . RHINOPLASTY  1969  . TONSILLECTOMY     @ 72 y.o.  . TOTAL ANKLE ARTHROPLASTY  10/30/2011   Procedure: TOTAL ANKLE ARTHOPLASTY;  Surgeon: Wylene Simmer, MD;  Location: [facility];  Service: Orthopedics;  Laterality: Right;  RIGHT TOTAL ANKLE REPLACEMENT, POSSIBLE GASTROC RESESSION  . TUBAL  LIGATION  1980    There were no vitals filed for this visit.   Subjective Assessment - 08/19/17 1121    Subjective  Pt reports undergoing a Right knee menisectomy 7 weeks ago. She has history of Left knee meniscetomy and a TKA on the left, but she is surprised at the persistent pain in weightbearing on the right knee. 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 Pt reports her right knee continues to buckle and she is limiting her AMB outside of the house. She has been working on deep water aerobics and riding a stationary bike at home without resistance up to 30 minutes. Pt is very fearful of falling.     Limitations  Walking    How long can you sit comfortably?  5-10 minutes    How long can you stand comfortably?  no relavent limitations at this time.      How long can you walk comfortably?  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     Currently in Pain?  Yes    Pain Score  2     Pain Orientation  Right    Pain Descriptors / Indicators  Aching    Pain Type  Chronic pain    Pain Onset  More than a month ago    Pain Frequency  Constant    Aggravating Factors   walking, prolonged sitting     Pain Relieving Factors  movement, icing, elevating          [facility] PT Assessment - 08/19/17 0001      Assessment   Medical Diagnosis  s/p Right knee scope    Referring Provider  Netta Cedars, MD     Onset Date/Surgical Date  07/01/17    Hand Dominance  Left    Next MD Visit  09/16/17    Prior Therapy  None, self guided after surgery with 3 exercises form surgeon      Precautions   Precautions  None      Restrictions   Weight Bearing Restrictions  No      Balance Screen   Has the patient fallen in the past 6 months  No    Has the patient had a decrease in activity level  because of a fear of falling?   Yes    Is the patient reluctant to leave their home because of a fear of falling?   Yes      Home Environment   Additional Comments  Pt not accessing 2nd floor b/c of knee 5-6 steps going into home, quite limited.       Prior Function   Level of Independence  Independent    Vocation  Retired    Leisure  trains and shows [employer]       Cognition   Overall Cognitive Status  Within Functional Limits for tasks assessed      Observation/Other Assessments   Focus on Therapeutic Outcomes (FOTO)   42 (58% impairment)       ROM / Strength   AROM / PROM / Strength  Strength;PROM      PROM   PROM Assessment Site  Knee    Right/Left Knee  Right    Right Knee Extension  4  (Pended)     Right Knee Flexion  132  (Pended)       Strength   Strength Assessment Site  Hip;Knee;Ankle    Right/Left Hip  Right;Left    Right Hip Flexion  4+/5    Right Hip Extension  3-/5 flexed and straight    Right Hip External Rotation   4-/5    Right Hip Internal Rotation  -- unable to test d/t pain    Right Hip ABduction  3+/5 horizontal abdct 4+/5     Right Hip ADduction  4+/5    Left Hip Flexion  5/5    Left Hip Extension  3-/5 flexed and straight    Left Hip External Rotation  5/5    Left Hip Internal Rotation  5/5    Left Hip ABduction  4-/5 horizontal abdct 4+/5     Left Hip ADduction  4+/5    Right/Left Knee  Right;Left    Right Knee Flexion  4-/5 mild pain    Right Knee Extension  3+/5 pain     Left Knee Flexion  5/5    Left Knee Extension  5/5    Right/Left Ankle  Right;Left      Ambulation/Gait   Ambulation Distance (Feet)  180 Feet limited by  time for gait analysis    Assistive device  None    Gait velocity  0.5m/s     Gait Comments  wide based d/t structrual valgus.                 Objective measurements completed on examination: See above findings.      [facility] Adult PT Treatment/Exercise - 08/19/17 0001      Exercises    Exercises  Knee/Hip      Knee/Hip Exercises: Standing   Knee Flexion  Right;AROM;1 set;10 reps      Knee/Hip Exercises: Seated   Long Arc Quad  Right;AROM;1 set;10 reps      Knee/Hip Exercises: Supine   Short Arc Quad Sets  Right;10 reps    Bridges  Both;1 set;10 reps             PT Education - 08/19/17 1313    Education Details  HEP; importance of improving strength prior to any inevitable TKA     Person(s) Educated  Patient    Methods  Explanation    Comprehension  Verbalized understanding;Returned demonstration;Need further instruction       PT Short Term Goals - 08/19/17 1319      PT SHORT TERM GOAL #1   Title  ROM to improve 8-125 degrees or greater.     Time  4    Period  Weeks    Status  New    Target Date  09/19/17      PT SHORT TERM GOAL #2   Title  MMT knee flexion/extension 4/5 or greater wiithout pain limitations.     Time  4    Period  Weeks    Status  New    Target Date  09/19/17      PT SHORT TERM GOAL #3   Title  Pt tolerate 1051ft continuous AMB @ > 0.95 m/s without knee buckling.     Time  4    Period  Weeks    Status  New    Target Date  09/19/17        PT Long Term Goals - 08/19/17 1321      PT LONG TERM GOAL #1   Title  5/5 strength in Rt quads and hamstrings; 4/5 in hip abductors bilat.     Time  8    Period  Weeks    Status  New    Target Date  10/20/17      PT LONG TERM GOAL #2   Title  5xSTS hands free performed in under 12 seconds.     Time  8    Period  Weeks    Status  New    Target Date  10/20/17      PT LONG TERM GOAL #3   Title  Pt AMB freely in the community without falls anxiety.     Time  8    Period  Weeks    Status  New    Target Date  10/20/17      PT LONG TERM GOAL #4   Title  BBT score > 50/56 to demonstrate reduced falls risk.     Time  8    Period  Days    Status  New    Target Date  10/20/17             Plan - 08/19/17 1315    Clinical Impression Statement  Pt presenting today with  referral from orthopedics now 7W s/p  Rt knee arthroscopic debridement of medial and lateral meniscus lesions. Pt reports good gradual progression of restored ROM in the right knee which examination supports, but persistent mild to moderate pain, significant joint control deficits with frequent buckling, and persistent balance deficits that have limited both tolerance and confidence in AMB outside of the home. Pt has been diligent with HEP issued postoperatively. She has remains quite active with water aerobics. Examination reveals mild Rt knee edema limiting TKE more than end range flexion and severe weakness of the knee joint stabilizers in the sagittal plan. Examination demonstrates significant weakness in hip extensors and abductors evaluation suggests that these deficits are chronic and progressive with structural varus and frontal plane instability in AMB. Pt will benefit from skilled PT intervention to address these deficits to aid in recovery of knee joint control in functional tasks and to educate on independent strengthening progress to be continued s/p DC.     History and Personal Factors relevant to plan of care:  Very motivated, active adn independent. Hx of Left TKA and Right Total Ankle Arthroplasty. Pt is a chronic celebrex user.    Clinical Presentation  Stable    Clinical Presentation due to:  objective tests and measuress    Clinical Decision Making  Moderate    Rehab Potential  Good    PT Frequency  2x / week    PT Duration  8 weeks    PT Treatment/Interventions  Gait training;Stair training;Moist Heat;Electrical Stimulation;Cryotherapy;Functional mobility training;Patient/family education;Manual techniques;Passive range of motion;Dry needling    PT Next Visit Plan  Review HEP and goals, begin basic hip extension/abdct strengthening, sensory level TENS to reduce neurological inhibition of the quads.     PT Home Exercise Plan  At eval: standing knee flexion, Supine SAQ, hooklying bridge,  seated LAQ     Consulted and Agree with Plan of Care  Patient       Patient will benefit from skilled therapeutic intervention in order to improve the following deficits and impairments:  Abnormal gait, Decreased knowledge of use of DME, Pain, Decreased mobility, Postural dysfunction, Decreased activity tolerance, Decreased balance, Decreased range of motion, Decreased strength, Difficulty walking, Increased edema, Obesity  Visit Diagnosis: Acute pain of right knee - Plan: PT plan of care cert/re-cert  Stiffness of right knee, not elsewhere classified - Plan: PT plan of care cert/re-cert     Problem List Patient Active Problem List   Diagnosis Date Noted  . Obesity (BMI 30.0-34.9) 04/30/2017  . 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 Vaginal dryness, menopausal 08/21/2014  . Osteopenia 12/14/2013  . Essential hypertension 12/14/2013  . Complex regional pain syndrome of lower limb 10/04/2012  . Hyperlipidemia 10/04/2012  . Allergic rhinitis 10/04/2012  . Generalized osteoarthrosis, involving multiple sites 10/04/2012   ,1:33 PM, 08/19/17 Etta Grandchild, PT, DPT Physical Therapist at [place] [phone] (office)     Etta Grandchild 08/19/2017, 1:31 PM  [facility] [address] [place], [place], [address] Phone: [phone]   Fax:  [phone]  Name: [name] MRN: [id number] Date of Birth: 1945-10-23

## 2017-08-20 ENCOUNTER — Ambulatory Visit (HOSPITAL_COMMUNITY): Payer: Medicare Other

## 2017-08-20 DIAGNOSIS — M25561 Pain in right knee: Secondary | ICD-10-CM | POA: Diagnosis not present

## 2017-08-20 DIAGNOSIS — M25661 Stiffness of right knee, not elsewhere classified: Secondary | ICD-10-CM | POA: Diagnosis not present

## 2017-08-20 NOTE — Therapy (Signed)
[place], [place], [address] Phone: [phone]   Fax:  [phone]  Physical Therapy Treatment  Patient Details  Name: [name] MRN: [id number] Date of Birth: 30-Nov-1945 Referring Provider: Netta Cedars, MD    Encounter Date: 08/20/2017  PT End of Session - 08/20/17 1208    Visit Number  2    Number of Visits  16    Date for PT Re-Evaluation  10/19/17    Authorization Type  [employer]; with secondary: 60 visits total for PT/OT/ST     Authorization Time Period  Eval on 08/19/17, reassessment on 09/19/17; ReEval on 10/20/17    PT Start Time  1124    PT Stop Time  1202    PT Time Calculation (min)  38 min    Activity Tolerance  Patient tolerated treatment well;No increased pain    Behavior During Therapy  WFL for tasks assessed/performed       Past Medical History:  Diagnosis Date  . Allergy   . Contact dermatitis and other eczema, due to unspecified cause   . Encounter for long-term (current) use of other medications   . Hyperlipidemia   . Hypertension    been resolved >1 year.  . Other and unspecified hyperlipidemia   . Pain in joint, ankle and foot   . Screening for malignant neoplasm of the cervix     Past Surgical History:  Procedure Laterality Date  . ANKLE SURGERY  05/2006   Right Ankle  . Carpel TUnnel Release  [id number]   [id number]), Left(2006)  . CATARACT EXTRACTION Left 10/03/14  . CATARACT EXTRACTION Right 11/16/2014  . COLONOSCOPY    . DIAGNOSTIC LAPAROSCOPY  1980  . JOINT REPLACEMENT     Total Knee(L)-11/05/07  . KNEE ARTHROPLASTY  05/2007   Left  . POLYPECTOMY    . RHINOPLASTY  1969  . TONSILLECTOMY     @ 72 y.o.  . TOTAL ANKLE ARTHROPLASTY  10/30/2011   Procedure: TOTAL ANKLE ARTHOPLASTY;  Surgeon: Wylene Simmer, MD;  Location: [facility];  Service: Orthopedics;  Laterality: Right;  RIGHT TOTAL ANKLE REPLACEMENT, POSSIBLE GASTROC RESESSION  . TUBAL LIGATION  1980    There were no  vitals filed for this visit.  Subjective Assessment - 08/20/17 1127    Subjective  Pt doing fine today. She says he rpain is unchanged, ususlly well controlled but worst when resuming actvitiy after sitting for > 1hour.     Limitations  Walking    Currently in Pain?  No/denies                       [facility] Adult PT Treatment/Exercise - 08/20/17 0001      Knee/Hip Exercises: Standing   Knee Flexion  Right;AROM;1 set;10 reps 2lb ankle weight    Other Standing Knee Exercises  sidestepping: 1x81ft bilat 0#; 1x67ft bilat c 2lb weights      Knee/Hip Exercises: Supine   Short Arc Quad Sets  Right;10 reps;3 sets;Strengthening 1x10 at 2lb; 2x10 at 4lb     Heel Slides  Right;20 reps    Bridges  Both;1 set;10 reps knees standardizedat 90 degree for HS involvement    Bridges with Clamshell  Both;1 set;10 reps green TB, wide feet, increased flexion in knees      Modalities   Modalities  Teacher, English as a foreign language Location  Sensory TENS  Electrical Stimulation Action  concordant with supine therex    Electrical Stimulation Goals  Neuromuscular facilitation reduce neurological inhibition.              PT Education - 08/19/17 1313    Education Details  HEP; importance of improving strength prior to any inevitable TKA     Person(s) Educated  Patient    Methods  Explanation    Comprehension  Verbalized understanding;Returned demonstration;Need further instruction       PT Short Term Goals - 08/19/17 1319      PT SHORT TERM GOAL #1   Title  ROM to improve 8-125 degrees or greater.     Time  4    Period  Weeks    Status  New    Target Date  09/19/17      PT SHORT TERM GOAL #2   Title  MMT knee flexion/extension 4/5 or greater wiithout pain limitations.     Time  4    Period  Weeks    Status  New    Target Date  09/19/17      PT SHORT TERM GOAL #3   Title  Pt tolerate 1011ft continuous AMB @ > 0.95 m/s without  knee buckling.     Time  4    Period  Weeks    Status  New    Target Date  09/19/17        PT Long Term Goals - 08/20/17 1216      PT LONG TERM GOAL #1   Title  5/5 strength in Rt quads and hamstrings; 4/5 in hip abductors bilat.     Time  8    Period  Weeks    Status  New      PT LONG TERM GOAL #2   Title  5xSTS hands free performed in under 12 seconds.     Time  8    Period  Weeks    Status  New      PT LONG TERM GOAL #3   Title  Pt AMB freely in the community without falls anxiety.     Time  8    Period  Weeks    Status  New      PT LONG TERM GOAL #4   Title  BBT score > 50/56 to demonstrate reduced falls risk.     Period  Days    Status  New      PT LONG TERM GOAL #5   Title  Pt would like to be able to rise from the floor from a half/kneeing or lunge position.     Time  8    Period  Weeks    Status  New            Plan - 08/20/17 1209    Clinical Impression Statement  Began to integrate soem new basic strengthenign activities this date, targeting hamstrings, quads, hip abdcutors/rotators/extensors. Side step activity, demonstrates good form, and low subjective proprioception suggestive of somewaht better strength than assessed at eval. Pt encouraged to continue with current HEP adn explained that glute med specific exercises will be added at next session. Good tolerance to session overall, albeit soem increases infrapatellar crepitus with increased loading in SAQ.     Rehab Potential  Good    PT Frequency  2x / week    PT Duration  8 weeks    PT Treatment/Interventions  Gait training;Stair training;Moist Heat;Electrical Stimulation;Cryotherapy;Functional mobility training;Patient/family education;Manual techniques;Passive range  of motion;Dry needling    PT Next Visit Plan  Continue with portable sensory TENS during exercises (X pattern at anterior knee joint), add in 2 HEP activites for gluteal hip abduction (consider banded side steps and band bridge).  Continue to target quads and hamstrings in isolation. Avoid early transisition to funcitonal exercieses to prevent compensational motor patterns.     PT Home Exercise Plan  At eval: standing knee flexion, Supine SAQ, hooklying bridge, seated LAQ     Consulted and Agree with Plan of Care  Patient       Patient will benefit from skilled therapeutic intervention in order to improve the following deficits and impairments:  Abnormal gait, Decreased knowledge of use of DME, Pain, Decreased mobility, Postural dysfunction, Decreased activity tolerance, Decreased balance, Decreased range of motion, Decreased strength, Difficulty walking, Increased edema, Obesity  Visit Diagnosis: Acute pain of right knee  Stiffness of right knee, not elsewhere classified     Problem List Patient Active Problem List   Diagnosis Date Noted  . Obesity (BMI 30.0-34.9) 04/30/2017  . 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 Vaginal dryness, menopausal 08/21/2014  . Osteopenia 12/14/2013  . Essential hypertension 12/14/2013  . Complex regional pain syndrome of lower limb 10/04/2012  . Hyperlipidemia 10/04/2012  . Allergic rhinitis 10/04/2012  . Generalized osteoarthrosis, involving multiple sites 10/04/2012   12:21 PM, 08/20/17 Etta Grandchild, PT, DPT Physical Therapist at [facility] [phone] (office)     Etta Grandchild 08/20/2017, 12:21 PM  [facility] [place], [place], [address] Phone: [phone]   Fax:  [phone]  Name: [name] MRN: [id number] Date of Birth: 1945-05-22

## 2017-08-25 ENCOUNTER — Encounter (HOSPITAL_COMMUNITY): Payer: Self-pay

## 2017-08-25 ENCOUNTER — Ambulatory Visit (HOSPITAL_COMMUNITY): Payer: Medicare Other

## 2017-08-25 DIAGNOSIS — M25661 Stiffness of right knee, not elsewhere classified: Secondary | ICD-10-CM

## 2017-08-25 DIAGNOSIS — M25561 Pain in right knee: Secondary | ICD-10-CM

## 2017-08-25 NOTE — Patient Instructions (Signed)
Abductor Strength: Bridge Pose (Strap)    Make strap wide enough to brace knees at hip width. Press into strap with knees. Hold for 3-5 breaths. Repeat 15 times.  Copyright  VHI. All rights reserved.   Band Walk: Side Stepping    Tie band around legs, just above knees. Step 20 feet to one side, then step back to start. Repeat 20 feet per session. Note: Small towel between band and skin eases rubbing.  http://plyo.exer.us/76   Copyright  VHI. All rights reserved.   Abduction    Lift leg up toward ceiling. Return. Use 2.5  lbs on ankle. Repeat 15 times each leg. Do 1-2 sessions per day.  http://gt2.exer.us/386   Copyright  VHI. All rights reserved.

## 2017-08-25 NOTE — Therapy (Signed)
[place], [place], [address] Phone: [phone]   Fax:  [phone]  Physical Therapy Treatment  Patient Details  Name: [name] MRN: [id number] Date of Birth: 72 years old Referring Provider: Netta Cedars, MD    Encounter Date: 08/25/2017  PT End of Session - 08/25/17 1308    Visit Number  3    Number of Visits  16    Date for PT Re-Evaluation  10/19/17 minireassess 09/19/17    Authorization Type  [employer]; with secondary: 60 visits total for PT/OT/ST     Authorization Time Period  Eval on 08/19/17, reassessment on 09/19/17; ReEval on 9/48/54; cert:    PT Start Time  1301    PT Stop Time  1348    PT Time Calculation (min)  47 min    Activity Tolerance  Patient tolerated treatment well;No increased pain    Behavior During Therapy  WFL for tasks assessed/performed       Past Medical History:  Diagnosis Date  . Allergy   . Contact dermatitis and other eczema, due to unspecified cause   . Encounter for long-term (current) use of other medications   . Hyperlipidemia   . Hypertension    been resolved >1 year.  . Other and unspecified hyperlipidemia   . Pain in joint, ankle and foot   . Screening for malignant neoplasm of the cervix     Past Surgical History:  Procedure Laterality Date  . ANKLE SURGERY  05/2006   Right Ankle  . Carpel TUnnel Release  [id number]   [id number]), Left(2006)  . CATARACT EXTRACTION Left 10/03/14  . CATARACT EXTRACTION Right 11/16/2014  . COLONOSCOPY    . DIAGNOSTIC LAPAROSCOPY  1980  . JOINT REPLACEMENT     Total Knee(L)-11/05/07  . KNEE ARTHROPLASTY  05/2007   Left  . POLYPECTOMY    . RHINOPLASTY  1969  . TONSILLECTOMY     @ 72 y.o.  . TOTAL ANKLE ARTHROPLASTY  10/30/2011   Procedure: TOTAL ANKLE ARTHOPLASTY;  Surgeon: Wylene Simmer, MD;  Location: [facility];  Service: Orthopedics;  Laterality: Right;  RIGHT TOTAL ANKLE REPLACEMENT, POSSIBLE GASTROC RESESSION  . TUBAL LIGATION   1980    There were no vitals filed for this visit.  Subjective Assessment - 08/25/17 1305    Subjective  Pt stated she had increased ache yesterday, increased pain with weight bearing.  Reports compliance iwht HEP 2x/daily.    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     Currently in Pain?  Yes    Pain Score  2     Pain Location  Knee    Pain Orientation  Right    Pain Descriptors / Indicators  Aching;Tightness;Discomfort stiffness    Pain Type  Chronic pain    Pain Onset  More than a month ago    Pain Frequency  Constant    Aggravating Factors   walking, prolonged sitting    Pain Relieving Factors  movment, icing, elevating                       [facility] Adult PT Treatment/Exercise - 08/25/17 0001      Knee/Hip Exercises: Standing   Other Standing Knee Exercises  sidestep down hallway with RTB 1RT      Knee/Hip Exercises: Supine   Short Arc Quad Sets  Right;2 sets;15  reps;Limitations    Short Arc [employer] Limitations  5" holds with 4#    Bridges  1 set;15 reps feet elevated on 12in step for hs activation    Bridges with Clamshell  1 set;15 reps GTB, wide feet, increased flexion in knees    Knee Extension  AROM;Limitations    Knee Extension Limitations  6    Knee Flexion  AROM;Limitations    Knee Flexion Limitations  130      Knee/Hip Exercises: Sidelying   Hip ABduction  Both;15 reps;Limitations    Hip ABduction Limitations  cueing for neutral hip to reduce ER      Electrical Stimulation   Electrical Stimulation Location  Sensory TENS     Electrical Stimulation Action  during supine therex    Electrical Stimulation Goals  Neuromuscular facilitation reduce neurological inhibition               PT Short Term Goals - 08/25/17 1407      PT SHORT TERM GOAL #1   Title  ROM to improve 8-125 degrees or greater.     Baseline  7/15: 6-130 degrees    Status  Achieved         PT Long Term Goals - 08/20/17 1216      PT LONG TERM GOAL #1   Title  5/5 strength in Rt quads and hamstrings; 4/5 in hip abductors bilat.     Time  8    Period  Weeks    Status  New      PT LONG TERM GOAL #2   Title  5xSTS hands free performed in under 12 seconds.     Time  8    Period  Weeks    Status  New      PT LONG TERM GOAL #3   Title  Pt AMB freely in the community without falls anxiety.     Time  8    Period  Weeks    Status  New      PT LONG TERM GOAL #4   Title  BBT score > 50/56 to demonstrate reduced falls risk.     Period  Days    Status  New      PT LONG TERM GOAL #5   Title  Pt would like to be able to rise from the floor from a half/kneeing or lunge position.     Time  8    Period  Weeks    Status  New            Plan - 08/25/17 1356    Clinical Impression Statement  Session focus on knee mobility and proximal strengthening for quad, hamstrings and gluteal musculature.  Added height with bridges for increased hamsting activaiton and resistance with bridges and sidestepping for glut med activation/strengthening.  Added sidelying abduction as well with minimal cueing to reduce ER for glut med strengthening.  Pt able to demonstrate good mechanics with all these exercises and given additional HEP as pt plans to go to [place] tomorrow.  No reports of increased pain through session.  Improved mobility with AROM 6-130 degrees.  Utilized TENS unit through session reduce neurological inhibition.      Rehab Potential  Good    PT Frequency  2x / week    PT Duration  8 weeks    PT Treatment/Interventions  Gait training;Stair training;Moist Heat;Electrical Stimulation;Cryotherapy;Functional mobility training;Patient/family education;Manual techniques;Passive range of motion;Dry needling    PT Next Visit Plan  Continue with portable sensory TENS during exercises (X pattern at anterior knee joint), Continue to target quads and hamstrings in isolation. Avoid  early transisition to funcitonal exercieses to prevent compensational motor patterns.     PT Home Exercise Plan  At eval: standing knee flexion, Supine SAQ, hooklying bridge, seated LAQ        Patient will benefit from skilled therapeutic intervention in order to improve the following deficits and impairments:  Abnormal gait, Decreased knowledge of use of DME, Pain, Decreased mobility, Postural dysfunction, Decreased activity tolerance, Decreased balance, Decreased range of motion, Decreased strength, Difficulty walking, Increased edema, Obesity  Visit Diagnosis: Acute pain of right knee  Stiffness of right knee, not elsewhere classified     Problem List Patient Active Problem List   Diagnosis Date Noted  . Obesity (BMI 30.0-34.9) 04/30/2017  . 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 Vaginal dryness, menopausal 08/21/2014  . Osteopenia 12/14/2013  . Essential hypertension 12/14/2013  . Complex regional pain syndrome of lower limb 10/04/2012  . Hyperlipidemia 10/04/2012  . Allergic rhinitis 10/04/2012  . Generalized osteoarthrosis, involving multiple sites 10/04/2012   Ihor Austin, [place]; [place]  Aldona Lento 08/25/2017, 2:26 PM  [place], [place], [address] Phone: [phone]   Fax:  [phone]  Name: [name] MRN: [id number] Date of Birth: 05/28/1945

## 2017-09-01 ENCOUNTER — Telehealth (HOSPITAL_COMMUNITY): Payer: Self-pay

## 2017-09-01 NOTE — Telephone Encounter (Signed)
[place] and the [facility] is closed she will fly back tomorrow and be here on the 26 of July

## 2017-09-02 ENCOUNTER — Encounter (HOSPITAL_COMMUNITY): Payer: Medicare Other

## 2017-09-04 ENCOUNTER — Ambulatory Visit (HOSPITAL_COMMUNITY): Payer: Medicare Other

## 2017-09-04 DIAGNOSIS — M25661 Stiffness of right knee, not elsewhere classified: Secondary | ICD-10-CM | POA: Diagnosis not present

## 2017-09-04 DIAGNOSIS — M25561 Pain in right knee: Secondary | ICD-10-CM | POA: Diagnosis not present

## 2017-09-04 NOTE — Therapy (Signed)
[facility] [place], [place], [address] Phone: [phone]   Fax:  [phone]  Physical Therapy Treatment  Patient Details  Name: [name] MRN: [id number] Date of Birth: [birth year] Referring Provider: Netta Cedars, MD    Encounter Date: 09/04/2017  PT End of Session - 09/04/17 1013    Visit Number  4    Number of Visits  16    Date for PT Re-Evaluation  10/19/17    Authorization Type  [employer]; with secondary: 60 visits total for PT/OT/ST     Authorization Time Period  Eval on 08/19/17, reassessment on 09/19/17; ReEval on 0/76/22; cert:    PT Start Time  0955    PT Stop Time  1025    PT Time Calculation (min)  30 min    Equipment Utilized During Treatment  Gait belt    Activity Tolerance  Patient tolerated treatment well;No increased pain    Behavior During Therapy  WFL for tasks assessed/performed       Past Medical History:  Diagnosis Date  . Allergy   . Contact dermatitis and other eczema, due to unspecified cause   . Encounter for long-term (current) use of other medications   . Hyperlipidemia   . Hypertension    been resolved >1 year.  . Other and unspecified hyperlipidemia   . Pain in joint, ankle and foot   . Screening for malignant neoplasm of the cervix     Past Surgical History:  Procedure Laterality Date  . ANKLE SURGERY  05/2006   Right Ankle  . Carpel TUnnel Release  [id number]   [id number]), Left(2006)  . CATARACT EXTRACTION Left 10/03/14  . CATARACT EXTRACTION Right 11/16/2014  . COLONOSCOPY    . DIAGNOSTIC LAPAROSCOPY  1980  . JOINT REPLACEMENT     Total Knee(L)-11/05/07  . KNEE ARTHROPLASTY  05/2007   Left  . POLYPECTOMY    . RHINOPLASTY  1969  . TONSILLECTOMY     @ 72 y.o.  . TOTAL ANKLE ARTHROPLASTY  10/30/2011   Procedure: TOTAL ANKLE ARTHOPLASTY;  Surgeon: Wylene Simmer, MD;  Location: [facility];  Service: Orthopedics;  Laterality: Right;  RIGHT TOTAL ANKLE REPLACEMENT, POSSIBLE  GASTROC RESESSION  . TUBAL LIGATION  1980    There were no vitals filed for this visit.  Subjective Assessment - 09/04/17 0958    Subjective  Pt reports she was in [place] for a week recently. HEP going well. Walking improving, but soreness adn stiffness with prolonged activty continue to remain limiting at times.     Currently in Pain?  No/denies                       [facility] Adult PT Treatment/Exercise - 09/04/17 0001      Ambulation/Gait   Ambulation Distance (Feet)  450 Feet    Gait velocity  0.5m/s     Gait Comments  fairly antalgic      Knee/Hip Exercises: Standing   Heel Raises  Both;2 sets;20 reps    Other Standing Knee Exercises  sidestepping: 2x66ft bilat 2lb weights      Knee/Hip Exercises: Seated   Long Arc Quad  Right;2 sets;10 reps 4lb ankle weights    Sit to Sand  15 reps;2 sets;without UE support VC for touchdown without sitting      Knee/Hip Exercises: Supine   Short Arc Quad Sets  Right;2 sets;15 reps;Limitations    Short [employer] Limitations  4lb ankle weight    Bridges  15 reps;2 sets;Both feet elevated on 12in step for hs activation    Bridges with Clamshell  15 reps;2 sets GTB, wide feet, increased flexion in knees    Straight Leg Raises  Right;2 sets;15 reps      Knee/Hip Exercises: Sidelying   Hip ABduction  Both;15 reps;Limitations    Hip ABduction Limitations  cueing for neutral hip to reduce ER      Modalities   Modalities  Electrical Stimulation      Electrical Stimulation   Electrical Stimulation Location  Sensory TENS     Electrical Stimulation Action  concordant with Therex    Electrical Stimulation Goals  Neuromuscular facilitation reduce neurological inhibition      Manual Therapy   Manual Therapy  Joint mobilization    Joint Mobilization  distraction flexion mobilization:  3x30sec Right knee in supine               PT Short Term Goals - 08/25/17 1407      PT SHORT TERM GOAL #1   Title  ROM to improve  8-125 degrees or greater.     Baseline  7/15: 6-130 degrees    Status  Achieved        PT Long Term Goals - 08/20/17 1216      PT LONG TERM GOAL #1   Title  5/5 strength in Rt quads and hamstrings; 4/5 in hip abductors bilat.     Time  8    Period  Weeks    Status  New      PT LONG TERM GOAL #2   Title  5xSTS hands free performed in under 12 seconds.     Time  8    Period  Weeks    Status  New      PT LONG TERM GOAL #3   Title  Pt AMB freely in the community without falls anxiety.     Time  8    Period  Weeks    Status  New      PT LONG TERM GOAL #4   Title  BBT score > 50/56 to demonstrate reduced falls risk.     Period  Days    Status  New      PT LONG TERM GOAL #5   Title  Pt would like to be able to rise from the floor from a half/kneeing or lunge position.     Time  8    Period  Weeks    Status  New            Plan - 09/04/17 1015    Clinical Impression Statement  Continued with current program, continued focus on quad strength, knee ROM, Integrated additional exercises this date as tolerated well. Also continue with sensory level TENS as well as added joint mobilization to facilitate neurons. Stregnth and gait speed are improving this session. Mild paina dn catching with second set of squats onces surface is lowered a bit. Pt progressign well overeall toward goals.     Rehab Potential  Good    PT Frequency  2x / week    PT Duration  8 weeks    PT Treatment/Interventions  Gait training;Stair training;Moist Heat;Electrical Stimulation;Cryotherapy;Functional mobility training;Patient/family education;Manual techniques;Passive range of motion;Dry needling    PT Next Visit Plan  Continue with portable sensory TENS during exercises (X pattern at anterior knee joint), Continue to target quads and hamstrings in isolation. Avoid early  transisition to funcitonal exercieses to prevent compensational motor patterns.     PT Home Exercise Plan  At eval: standing knee  flexion, Supine SAQ, hooklying bridge, seated LAQ     Consulted and Agree with Plan of Care  Patient       Patient will benefit from skilled therapeutic intervention in order to improve the following deficits and impairments:  Abnormal gait, Decreased knowledge of use of DME, Pain, Decreased mobility, Postural dysfunction, Decreased activity tolerance, Decreased balance, Decreased range of motion, Decreased strength, Difficulty walking, Increased edema, Obesity  Visit Diagnosis: Acute pain of right knee  Stiffness of right knee, not elsewhere classified     Problem List Patient Active Problem List   Diagnosis Date Noted  . Obesity (BMI 30.0-34.9) 04/30/2017  . 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 Vaginal dryness, menopausal 08/21/2014  . Osteopenia 12/14/2013  . Essential hypertension 12/14/2013  . Complex regional pain syndrome of lower limb 10/04/2012  . Hyperlipidemia 10/04/2012  . Allergic rhinitis 10/04/2012  . Generalized osteoarthrosis, involving multiple sites 10/04/2012  10:29 AM, 09/04/17 Etta Grandchild, PT, DPT Physical Therapist at [facility] [phone] (office)      Etta Grandchild 09/04/2017, 10:26 AM  [place], [place], [address] Phone: [phone]   Fax:  [phone]  Name: RAYVEN RETTIG MRN: [id number] Date of Birth: 20-Oct-1945

## 2017-09-07 ENCOUNTER — Ambulatory Visit (HOSPITAL_COMMUNITY): Payer: Medicare Other | Admitting: Physical Therapy

## 2017-09-07 DIAGNOSIS — M25661 Stiffness of right knee, not elsewhere classified: Secondary | ICD-10-CM

## 2017-09-07 DIAGNOSIS — M25561 Pain in right knee: Secondary | ICD-10-CM

## 2017-09-07 NOTE — Therapy (Signed)
[place], [place], [address] Phone: [phone]   Fax:  [phone]  Physical Therapy Treatment  Patient Details  Name: [name] MRN: [id number] Date of Birth: 1945-07-03 Referring Provider: Netta Cedars, MD    Encounter Date: 09/07/2017  PT End of Session - 09/07/17 1031    Visit Number  5    Number of Visits  16    Date for PT Re-Evaluation  10/19/17    Authorization Type  [employer]; with secondary: 60 visits total for PT/OT/ST     Authorization Time Period  Eval on 08/19/17, reassessment on 09/19/17; ReEval on 05/30/35; cert:    PT Start Time  0952    PT Stop Time  1030    PT Time Calculation (min)  38 min    Equipment Utilized During Treatment  Gait belt    Activity Tolerance  Patient tolerated treatment well;No increased pain    Behavior During Therapy  WFL for tasks assessed/performed       Past Medical History:  Diagnosis Date  . Allergy   . Contact dermatitis and other eczema, due to unspecified cause   . Encounter for long-term (current) use of other medications   . Hyperlipidemia   . Hypertension    been resolved >1 year.  . Other and unspecified hyperlipidemia   . Pain in joint, ankle and foot   . Screening for malignant neoplasm of the cervix     Past Surgical History:  Procedure Laterality Date  . ANKLE SURGERY  05/2006   Right Ankle  . Carpel TUnnel Release  [id number]   [id number]), Left(2006)  . CATARACT EXTRACTION Left 10/03/14  . CATARACT EXTRACTION Right 11/16/2014  . COLONOSCOPY    . DIAGNOSTIC LAPAROSCOPY  1980  . JOINT REPLACEMENT     Total Knee(L)-11/05/07  . KNEE ARTHROPLASTY  05/2007   Left  . POLYPECTOMY    . RHINOPLASTY  1969  . TONSILLECTOMY     @ 72 y.o.  . TOTAL ANKLE ARTHROPLASTY  10/30/2011   Procedure: TOTAL ANKLE ARTHOPLASTY;  Surgeon: Wylene Simmer, MD;  Location: [facility];  Service: Orthopedics;  Laterality: Right;  RIGHT TOTAL ANKLE REPLACEMENT, POSSIBLE  GASTROC RESESSION  . TUBAL LIGATION  1980    There were no vitals filed for this visit.  Subjective Assessment - 09/07/17 1037    Subjective  Pt states she just left deep water aerobics at the [facility] and completes her stationary bike and HEP consistantly.  Reports she really only has pain with weight bearing.     Currently in Pain?  No/denies                       [facility] Adult PT Treatment/Exercise - 09/07/17 0001      Knee/Hip Exercises: Standing   Heel Raises  20 reps    Forward Lunges  Both;10 reps;Limitations    Forward Lunges Limitations  onto 4" in good mechanics, no UE assist    SLS  best of 5: Rt: 6"     SLS with Vectors  5X5" on Rt LE with 1 HHA    Gait Training  226 feet no AD    Other Standing Knee Exercises  sidestepping: 2Rt wit GTB      Knee/Hip Exercises: Seated   Long Arc Quad  Right;2 sets;10 reps;Limitations    Heel Slides  --      Knee/Hip Exercises: Supine   Bridges  15  reps;2 sets;Both;Limitations    Bridges Limitations  one set normal, one set on 8" step    Bridges with Clamshell  15 reps;2 sets;Limitations GTB    Straight Leg Raises  Right;2 sets;15 reps      Knee/Hip Exercises: Sidelying   Hip ABduction  Both;2 sets;15 reps    Hip ABduction Limitations  good form, no cues needed      Manual Therapy   Manual Therapy  Joint mobilization    Manual therapy comments  completed seperate from all other skilled interventions    Joint Mobilization  distraction, no edema present               PT Short Term Goals - 08/25/17 1407      PT SHORT TERM GOAL #1   Title  ROM to improve 8-125 degrees or greater.     Baseline  7/15: 6-130 degrees    Status  Achieved        PT Long Term Goals - 08/20/17 1216      PT LONG TERM GOAL #1   Title  5/5 strength in Rt quads and hamstrings; 4/5 in hip abductors bilat.     Time  8    Period  Weeks    Status  New      PT LONG TERM GOAL #2   Title  5xSTS hands free performed in under 12  seconds.     Time  8    Period  Weeks    Status  New      PT LONG TERM GOAL #3   Title  Pt AMB freely in the community without falls anxiety.     Time  8    Period  Weeks    Status  New      PT LONG TERM GOAL #4   Title  BBT score > 50/56 to demonstrate reduced falls risk.     Period  Days    Status  New      PT LONG TERM GOAL #5   Title  Pt would like to be able to rise from the floor from a half/kneeing or lunge position.     Time  8    Period  Weeks    Status  New            Plan - 09/07/17 1032    Clinical Impression Statement  continued with knee/hip strengthening to help reduce pain in weight bearing.  Completed all exercises this session without sensory TENS with no significant difference or change noted per patient.  PT overall with good form and control cdompleting therex.  Added gentle lunges and single leg balance to help improve stability with noted challenge but no pain.  Joint distraction completed with no significant change.  No edema present around joing as palpated by therapist.     Rehab Potential  Good    PT Frequency  2x / week    PT Duration  8 weeks    PT Treatment/Interventions  Gait training;Stair training;Moist Heat;Electrical Stimulation;Cryotherapy;Functional mobility training;Patient/family education;Manual techniques;Passive range of motion;Dry needling    PT Next Visit Plan  Continue to target quads and hamstrings in isolation. Progress as tolerated as long as completing exericses in good form and painfree.     PT Home Exercise Plan  At eval: standing knee flexion, Supine SAQ, hooklying bridge, seated LAQ     Consulted and Agree with Plan of Care  Patient       Patient will  benefit from skilled therapeutic intervention in order to improve the following deficits and impairments:  Abnormal gait, Decreased knowledge of use of DME, Pain, Decreased mobility, Postural dysfunction, Decreased activity tolerance, Decreased balance, Decreased range of  motion, Decreased strength, Difficulty walking, Increased edema, Obesity  Visit Diagnosis: Acute pain of right knee  Stiffness of right knee, not elsewhere classified     Problem List Patient Active Problem List   Diagnosis Date Noted  . Obesity (BMI 30.0-34.9) 04/30/2017  . 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 Vaginal dryness, menopausal 08/21/2014  . Osteopenia 12/14/2013  . Essential hypertension 12/14/2013  . Complex regional pain syndrome of lower limb 10/04/2012  . Hyperlipidemia 10/04/2012  . Allergic rhinitis 10/04/2012  . Generalized osteoarthrosis, involving multiple sites 10/04/2012   Teena Irani, PTA/CLT [phone]  Teena Irani 09/07/2017, 10:38 AM  [facility] [address] [place], [place], [address] Phone: [phone]   Fax:  [phone]  Name: JAQUANNA BALLENTINE MRN: [id number] Date of Birth: [birth year]

## 2017-09-09 ENCOUNTER — Ambulatory Visit (HOSPITAL_COMMUNITY): Payer: Medicare Other | Admitting: Physical Therapy

## 2017-09-09 ENCOUNTER — Encounter (HOSPITAL_COMMUNITY): Payer: Self-pay | Admitting: Physical Therapy

## 2017-09-09 DIAGNOSIS — M25661 Stiffness of right knee, not elsewhere classified: Secondary | ICD-10-CM

## 2017-09-09 DIAGNOSIS — M25561 Pain in right knee: Secondary | ICD-10-CM | POA: Diagnosis not present

## 2017-09-09 NOTE — Therapy (Signed)
[place], [place], [address] Phone: [phone]   Fax:  [phone]  Physical Therapy Treatment  Patient Details  Name: [name] MRN: [id number] Date of Birth: 74-06-1945 Referring Provider: Netta Cedars, MD    Encounter Date: 09/09/2017  PT End of Session - 09/09/17 1021    Visit Number  6    Number of Visits  16    Date for PT Re-Evaluation  10/19/17    Authorization Type  [employer]; with secondary: 60 visits total for PT/OT/ST     Authorization Time Period  Eval on 08/19/17, reassessment on 09/19/17; ReEval on 4/40/10; cert:    PT Start Time  0949    PT Stop Time  1030    PT Time Calculation (min)  41 min    Equipment Utilized During Treatment  Gait belt    Activity Tolerance  Patient tolerated treatment well;No increased pain    Behavior During Therapy  WFL for tasks assessed/performed       Past Medical History:  Diagnosis Date  . Allergy   . Contact dermatitis and other eczema, due to unspecified cause   . Encounter for long-term (current) use of other medications   . Hyperlipidemia   . Hypertension    been resolved >1 year.  . Other and unspecified hyperlipidemia   . Pain in joint, ankle and foot   . Screening for malignant neoplasm of the cervix     Past Surgical History:  Procedure Laterality Date  . ANKLE SURGERY  05/2006   Right Ankle  . Carpel TUnnel Release  [id number]   [id number]), Left(2006)  . CATARACT EXTRACTION Left 10/03/14  . CATARACT EXTRACTION Right 11/16/2014  . COLONOSCOPY    . DIAGNOSTIC LAPAROSCOPY  1980  . JOINT REPLACEMENT     Total Knee(L)-11/05/07  . KNEE ARTHROPLASTY  05/2007   Left  . POLYPECTOMY    . RHINOPLASTY  1969  . TONSILLECTOMY     @ 72 y.o.  . TOTAL ANKLE ARTHROPLASTY  10/30/2011   Procedure: TOTAL ANKLE ARTHOPLASTY;  Surgeon: Wylene Simmer, MD;  Location: [facility];  Service: Orthopedics;  Laterality: Right;  RIGHT TOTAL ANKLE REPLACEMENT, POSSIBLE  GASTROC RESESSION  . TUBAL LIGATION  1980    There were no vitals filed for this visit.  Subjective Assessment - 09/09/17 0953    Subjective  Pt states she hurt the day after therapy and yesterday but after completing her water aerobics this morning, is all better and not hurting.      Currently in Pain?  No/denies                       [facility] Adult PT Treatment/Exercise - 09/09/17 0001      Knee/Hip Exercises: Standing   Heel Raises  20 reps    Knee Flexion  Right;AROM;1 set;10 reps    Forward Lunges  Both;Limitations;15 reps    Forward Lunges Limitations  onto 4" in good mechanics, no UE assist    Lateral Step Up  Right;10 reps;Hand Hold: 1;Step Height: 2"    Forward Step Up  Right;10 reps;Hand Hold: 1;Step Height: 4"    Step Down  Right;10 reps;Hand Hold: 1;Step Height: 2"    SLS  best of 5: Rt: 6"     SLS with Vectors  10X5" on Rt LE with 1 HHA    Other Standing Knee Exercises  sidestepping: 2Rt wit GTB  Knee/Hip Exercises: Seated   Sit to Sand  15 reps;without UE support;1 set      Knee/Hip Exercises: Supine   Short Arc Quad Sets  Right;2 sets;15 reps;Limitations    Short Arc Quad Sets Limitations  3# weight    Bridges  15 reps;2 sets;Both;Limitations    Bridges Limitations  one set normal, one set on 8" step    Bridges with Clamshell  15 reps;2 sets;Limitations    Straight Leg Raises  Right;2 sets;15 reps      Knee/Hip Exercises: Sidelying   Hip ABduction  Both;2 sets;15 reps    Hip ABduction Limitations  good form, no cues needed               PT Short Term Goals - 08/25/17 1407      PT SHORT TERM GOAL #1   Title  ROM to improve 8-125 degrees or greater.     Baseline  7/15: 6-130 degrees    Status  Achieved        PT Long Term Goals - 08/20/17 1216      PT LONG TERM GOAL #1   Title  5/5 strength in Rt quads and hamstrings; 4/5 in hip abductors bilat.     Time  8    Period  Weeks    Status  New      PT LONG TERM GOAL #2    Title  5xSTS hands free performed in under 12 seconds.     Time  8    Period  Weeks    Status  New      PT LONG TERM GOAL #3   Title  Pt AMB freely in the community without falls anxiety.     Time  8    Period  Weeks    Status  New      PT LONG TERM GOAL #4   Title  BBT score > 50/56 to demonstrate reduced falls risk.     Period  Days    Status  New      PT LONG TERM GOAL #5   Title  Pt would like to be able to rise from the floor from a half/kneeing or lunge position.     Time  8    Period  Weeks    Status  New            Plan - 09/09/17 1031    Clinical Impression Statement  continued with focus on improving hip and knee strength.  More pain in weight bearing vs non WB.  Began step ups in all directions to work on quad strength, hwoever too pairful to complete 4" lateral and step down having to decrease to 2" step.  Continues to have decreased single leg stance time on Rt with max of 6 seconds.  Pt able to complete all other exercises without diffiuculty and return to pain free at end of session (pain elevated with step ups).  No edema present this session.     Rehab Potential  Good    PT Frequency  2x / week    PT Duration  8 weeks    PT Treatment/Interventions  Gait training;Stair training;Moist Heat;Electrical Stimulation;Cryotherapy;Functional mobility training;Patient/family education;Manual techniques;Passive range of motion;Dry needling    PT Next Visit Plan  Re-evaluate next session as patient with MD appointment.    PT Home Exercise Plan  At eval: standing knee flexion, Supine SAQ, hooklying bridge, seated LAQ     Consulted and Agree with  Plan of Care  Patient       Patient will benefit from skilled therapeutic intervention in order to improve the following deficits and impairments:  Abnormal gait, Decreased knowledge of use of DME, Pain, Decreased mobility, Postural dysfunction, Decreased activity tolerance, Decreased balance, Decreased range of motion, Decreased  strength, Difficulty walking, Increased edema, Obesity  Visit Diagnosis: Acute pain of right knee  Stiffness of right knee, not elsewhere classified     Problem List Patient Active Problem List   Diagnosis Date Noted  . Obesity (BMI 30.0-34.9) 04/30/2017  . 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 Vaginal dryness, menopausal 08/21/2014  . Osteopenia 12/14/2013  . Essential hypertension 12/14/2013  . Complex regional pain syndrome of lower limb 10/04/2012  . Hyperlipidemia 10/04/2012  . Allergic rhinitis 10/04/2012  . Generalized osteoarthrosis, involving multiple sites 10/04/2012   Teena Irani, PTA/CLT [phone]  Teena Irani 09/09/2017, 10:34 AM  [place], [place], [address] Phone: [phone]   Fax:  [phone]  Name: ALLYSE FREGEAU MRN: [id number] Date of Birth: [birth year]

## 2017-09-11 ENCOUNTER — Ambulatory Visit: Payer: Medicare Other

## 2017-09-14 ENCOUNTER — Ambulatory Visit (HOSPITAL_COMMUNITY): Payer: Medicare Other | Attending: Orthopedic Surgery

## 2017-09-14 ENCOUNTER — Other Ambulatory Visit: Payer: Self-pay

## 2017-09-14 ENCOUNTER — Encounter (HOSPITAL_COMMUNITY): Payer: Self-pay

## 2017-09-14 DIAGNOSIS — M25561 Pain in right knee: Secondary | ICD-10-CM

## 2017-09-14 DIAGNOSIS — M25661 Stiffness of right knee, not elsewhere classified: Secondary | ICD-10-CM | POA: Diagnosis not present

## 2017-09-14 NOTE — Therapy (Signed)
[facility] [address] [place], [place], [address] Phone: [phone]   Fax:  [phone]  Physical Therapy Treatment/Progress Notes  Patient Details  Name: [name] MRN: [id number] Date of Birth: 1945/12/04 Referring Provider: Netta Cedars, MD    Encounter Date: 09/14/2017  Progress Note Reporting Period 08/19/17 to 09/14/17  See note below for Objective Data and Assessment of Progress/Goals.    PT End of Session - 09/14/17 1313    Visit Number  7    Number of Visits  16    Date for PT Re-Evaluation  10/19/17 reassessed on 09/14/17    Authorization Type  [employer]; with secondary: 60 visits total for PT/OT/ST     Authorization Time Period  08/19/17 - 10/20/17    Authorization - Visit Number  7    Authorization - Number of Visits  60    PT Start Time  6962    PT Stop Time  1033    PT Time Calculation (min)  41 min    Equipment Utilized During Treatment  Gait belt    Activity Tolerance  Patient tolerated treatment well;No increased pain    Behavior During Therapy  WFL for tasks assessed/performed       Past Medical History:  Diagnosis Date  . Allergy   . Contact dermatitis and other eczema, due to unspecified cause   . Encounter for long-term (current) use of other medications   . Hyperlipidemia   . Hypertension    been resolved >1 year.  . Other and unspecified hyperlipidemia   . Pain in joint, ankle and foot   . Screening for malignant neoplasm of the cervix     Past Surgical History:  Procedure Laterality Date  . ANKLE SURGERY  05/2006   Right Ankle  . Carpel TUnnel Release  [id number]   [id number]), Left(2006)  . CATARACT EXTRACTION Left 10/03/14  . CATARACT EXTRACTION Right 11/16/2014  . COLONOSCOPY    . DIAGNOSTIC LAPAROSCOPY  1980  . JOINT REPLACEMENT     Total Knee(L)-11/05/07  . KNEE ARTHROPLASTY  05/2007   Left  . POLYPECTOMY    . RHINOPLASTY  1969  . TONSILLECTOMY     @ 72 y.o.  . TOTAL ANKLE  ARTHROPLASTY  10/30/2011   Procedure: TOTAL ANKLE ARTHOPLASTY;  Surgeon: Wylene Simmer, MD;  Location: [facility];  Service: Orthopedics;  Laterality: Right;  RIGHT TOTAL ANKLE REPLACEMENT, POSSIBLE GASTROC RESESSION  . TUBAL LIGATION  1980    There were no vitals filed for this visit.  Subjective Assessment - 09/14/17 0955    Subjective  Patient reports that her Rt knee is still bothering her, but she does feel that she is improving since starting therapy. She states her next follow up with her physician is this Wedensday and she is planning to discuss having a total knee replacement as she feels it is time and her pain is still to high after trying conservative treatment.     Limitations  Walking    How long can you sit comfortably?  5-10 minutes    How long can you stand comfortably?  no relavent limitations at this time.      How long can you walk comfortably?  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     Currently in Pain?  Yes    Pain Score  2     Pain Location  Knee    Pain Orientation  Right    Pain Descriptors / Indicators  Aching;Discomfort    Pain Type  Chronic pain    Pain Onset  More than a month ago    Pain Frequency  Constant    Aggravating Factors   walking    Pain Relieving Factors  ice, AROM helps when sitting too long        [facility] PT Assessment - 09/14/17 0001      Assessment   Medical Diagnosis  s/p Right knee scope    Referring Provider  Netta Cedars, MD     Onset Date/Surgical Date  07/01/17    Hand Dominance  Left    Next MD Visit  09/16/17    Prior Therapy  None, self guided after surgery with 3 exercises form surgeon      Precautions   Precautions  None      Restrictions   Weight Bearing Restrictions  No      Balance Screen   Has the patient fallen in the  past 6 months  No    Has the patient had a decrease in activity level because of a fear of falling?   No    Is the patient reluctant to leave their home because of a fear of falling?   No      Observation/Other Assessments   Focus on Therapeutic Outcomes (FOTO)   -- 58% limited on 08/19/17      PROM   Right Knee Extension  0    Right Knee Flexion  132      Strength   Right Hip Flexion  4+/5 was 4+    Right Hip Extension  4-/5 was 3-    Right Hip ABduction  4-/5 was 3+    Left Hip Flexion  5/5 was 5    Left Hip Extension  4-/5 was 3-    Left Hip ABduction  4/5 was 4-    Right Knee Extension  4/5 was 3+    Left Knee Flexion  5/5 was 5    Left Knee Extension  5/5 was 5    Right Ankle Dorsiflexion  5/5    Left Ankle Dorsiflexion  5/5      Transfers   Transfers  Floor to Transfer    Five time sit to stand comments   8.83 seconds no UE support    Floor to Transfer  6: Modified independent (Device/Increase time);With upper extremity assist    Comments  Floor to Stand: with UE support on mat table      Standardized Balance Assessment   Standardized Balance Assessment  Berg Balance Test      Berg Balance Test   Sit to Stand  Able to stand without using hands and stabilize independently    Standing Unsupported  Able to stand safely 2 minutes    Sitting with Back Unsupported but Feet Supported on Floor or Stool  Able to sit safely and securely 2 minutes    Stand to Sit  Sits safely with minimal use of hands    Transfers  Able to transfer safely, minor use of hands    Standing Unsupported with Eyes Closed  Able to stand 10 seconds safely    Standing Ubsupported with Feet Together  Able to place feet together independently and stand  1 minute safely    From Standing, Reach Forward with Outstretched Arm  Can reach forward >12 cm safely (5")    From Standing Position, Pick up Object from [place] to pick up shoe safely and easily    From Standing Position, Turn to Look Behind Over each  Shoulder  Looks behind from both sides and weight shifts well    Turn 360 Degrees  Able to turn 360 degrees safely in 4 seconds or less    Standing Unsupported, Alternately Place Feet on Step/Stool  Able to stand independently and safely and complete 8 steps in 20 seconds    Standing Unsupported, One Foot in [employer] to place foot tandem independently and hold 30 seconds    Standing on One Leg  Able to lift leg independently and hold > 10 seconds    Total Score  55        [facility] Adult PT Treatment/Exercise - 09/14/17 0001      Knee/Hip Exercises: Machines for Strengthening   Other Machine  [employer] leg pres: 2x 15 reps with plate 3 for weight      Knee/Hip Exercises: Standing   Other Standing Knee Exercises  sidestepping: 3Rt with RTB      Knee/Hip Exercises: Supine   Bridges  2 sets;Both;Limitations;20 reps        PT Education - 09/14/17 1312    Education Details  Educated on progress towards goals as her follow up with her physician is this Wednesday.     Person(s) Educated  Patient    Methods  Explanation    Comprehension  Verbalized understanding       PT Short Term Goals - 09/14/17 1314      PT SHORT TERM GOAL #1   Title  ROM to improve 8-125 degrees or greater.     Baseline  7/15: 6-130 degrees    Status  Achieved        PT Long Term Goals - 09/14/17 1314      PT LONG TERM GOAL #1   Title  5/5 strength in Rt quads and hamstrings; 4/5 in hip abductors bilat.     Time  8    Period  Weeks    Status  On-going      PT LONG TERM GOAL #2   Title  5xSTS hands free performed in under 12 seconds.     Time  8    Period  Weeks    Status  Achieved      PT LONG TERM GOAL #3   Title  Pt AMB freely in the community without falls anxiety.     Time  8    Period  Weeks    Status  Achieved      PT LONG TERM GOAL #4   Title  BBT score > 50/56 to demonstrate reduced falls risk.     Period  Days    Status  Achieved      PT LONG TERM GOAL #5   Title  Pt would like  to be able to rise from the floor from a half/kneeing or lunge position.     Baseline  patient requires UE support on mat table/chair    Time  8    Period  Weeks    Status  Partially Met        Plan - 09/14/17 1314    Clinical Impression Statement  Re-assessment performed this session and patient has made excellent progress towards  goals. She has improved bil LE strength for limited group by 1/2 grade but continues to have Rt knee extension weakness. She is no longer ambulating with SPC unless on uneven surfaces and has ROM WFL's. She also demonstrates no risk for falls with Merrilee Jansky Balance Testing and was able to perform floor to stand transfer this date, however has increased pain. She continues to be limited in daily/recreational activities such as showing her dogs and has expressed a desire to have a TKA performed on her Rt knee. She will benefit from therapy to continue addressing her current impairments and improving strength to improve mobility and potentially prepare for TKA to improve recovery.    Rehab Potential  Good    PT Frequency  2x / week    PT Duration  8 weeks    PT Treatment/Interventions  Gait training;Stair training;Moist Heat;Electrical Stimulation;Cryotherapy;Functional mobility training;Patient/family education;Manual techniques;Passive range of motion;Dry needling    PT Next Visit Plan  Continue with focuse on strengthening Bil LE's and follow up on plan for TKA on Rt knee after visit with MD.    PT Home Exercise Plan  At eval: standing knee flexion, Supine SAQ, hooklying bridge, seated LAQ     Consulted and Agree with Plan of Care  Patient       Patient will benefit from skilled therapeutic intervention in order to improve the following deficits and impairments:  Abnormal gait, Decreased knowledge of use of DME, Pain, Decreased mobility, Postural dysfunction, Decreased activity tolerance, Decreased balance, Decreased range of motion, Decreased strength, Difficulty walking,  Increased edema, Obesity  Visit Diagnosis: Acute pain of right knee  Stiffness of right knee, not elsewhere classified     Problem List Patient Active Problem List   Diagnosis Date Noted  . Obesity (BMI 30.0-34.9) 04/30/2017  . 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 Vaginal dryness, menopausal 08/21/2014  . Osteopenia 12/14/2013  . Essential hypertension 12/14/2013  . Complex regional pain syndrome of lower limb 10/04/2012  . Hyperlipidemia 10/04/2012  . Allergic rhinitis 10/04/2012  . Generalized osteoarthrosis, involving multiple sites 10/04/2012    Kipp Brood, PT, DPT Physical Therapist with [facility]  09/14/2017 1:26 PM    [place], [place], [address] Phone: [phone]   Fax:  [phone]  Name: ESTHA FEW MRN: [id number] Date of Birth: 06/07/1945

## 2017-09-15 ENCOUNTER — Ambulatory Visit (INDEPENDENT_AMBULATORY_CARE_PROVIDER_SITE_OTHER): Payer: Medicare Other

## 2017-09-15 VITALS — BP 138/80 | HR 91 | Temp 97.6°F | Ht 62.0 in | Wt 157.0 lb

## 2017-09-15 DIAGNOSIS — Z0000 Encounter for general adult medical examination without abnormal findings: Secondary | ICD-10-CM | POA: Diagnosis not present

## 2017-09-15 MED ORDER — ZOSTER VAC RECOMB ADJUVANTED 50 MCG/0.5ML IM SUSR: 0.5000 mL | Freq: Once | INTRAMUSCULAR | 1 refills | Status: AC

## 2017-09-15 NOTE — Progress Notes (Signed)
Subjective:   [name] is a 72 y.o. female who presents for Medicare Annual (Subsequent) preventive examination.  Last AWV-09/09/2016       Objective:     Vitals: BP 138/80 (BP Location: Left Arm, Patient Position: Sitting)   Pulse 91   Temp 97.6 F (36.4 C) (Oral)   Ht 5\' 2"  (1.575 m)   Wt 157 lb (71.2 kg)   SpO2 97%   BMI 28.72 kg/m   Body mass index is 28.72 kg/m.  Advanced Directives 09/15/2017 08/25/2017 08/19/2017 09/15/2016 09/09/2016 05/15/2016 05/01/2016  Does Patient Have a Medical Advance Directive? Yes No No Yes Yes Yes Yes  Type of Paramedic of [place];Living will - - [place];Living will [place];Living will - [place];Living will  Does patient want to make changes to medical advance directive? No - Patient declined - - - - - -  Copy of [place] in Chart? Yes - - - Yes - -  Would patient like information on creating a medical advance directive? No - Patient declined No - Patient declined No - Patient declined - - - -    Tobacco Social History   Tobacco Use  Smoking Status Former Smoker  . Packs/day: 0.50  . Years: 17.00  . Pack years: 8.50  . Types: Cigarettes  . Last attempt to quit: 12/17/1979  . Years since quitting: 37.7  Smokeless Tobacco Never Used     Counseling given: Not Answered   Clinical Intake:  Pre-visit preparation completed: No  Pain : 0-10 Pain Score: 2  Pain Type: Chronic pain Pain Location: Knee Pain Orientation: Right Pain Descriptors / Indicators: Aching Pain Onset: More than a month ago Pain Frequency: Constant     Nutritional Risks: None Diabetes: No  How often do you need to have someone help you when you read instructions, pamphlets, or other written materials from your doctor or pharmacy?: 1 - Never What is the last grade level you completed in school?: College  Interpreter Needed?: No  Information  entered by :: [name], RN  Past Medical History:  Diagnosis Date  . Allergy   . Contact dermatitis and other eczema, due to unspecified cause   . Encounter for long-term (current) use of other medications   . Hyperlipidemia   . Hypertension    been resolved >1 year.  . Other and unspecified hyperlipidemia   . Pain in joint, ankle and foot   . Screening for malignant neoplasm of the cervix    Past Surgical History:  Procedure Laterality Date  . ANKLE SURGERY  05/2006   Right Ankle  . Carpel TUnnel Release  [id number]   [id number]), Left(2006)  . CATARACT EXTRACTION Left 10/03/14  . CATARACT EXTRACTION Right 11/16/2014  . COLONOSCOPY    . DIAGNOSTIC LAPAROSCOPY  1980  . JOINT REPLACEMENT     Total Knee(L)-11/05/07  . KNEE ARTHROPLASTY  05/2007   Left  . POLYPECTOMY    . RHINOPLASTY  1969  . TONSILLECTOMY     @ 72 y.o.  . 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 TUBAL LIGATION  1980   Family History  Problem Relation Age of Onset  . Cancer Mother 28       breast  . Stomach cancer Maternal  Grandfather   . Colon cancer Paternal Grandfather   . Esophageal cancer Neg Hx   . Rectal cancer Neg Hx    Social History   Socioeconomic History  . Marital status: Married    Spouse name: Not on file  . Number of children: Not on file  . Years of education: Not on file  . Highest education level: Not on file  Occupational History  . Not on file  Social Needs  . Financial resource strain: Not hard at all  . Food insecurity:    Worry: Never true    Inability: Never true  . Transportation needs:    Medical: No    Non-medical: No  Tobacco Use  . Smoking status: Former Smoker    Packs/day: 0.50    Years: 17.00    Pack years: 8.50    Types: Cigarettes    Last attempt to quit: 12/17/1979     Years since quitting: 37.7  . Smokeless tobacco: Never Used  Substance and Sexual Activity  . Alcohol use: Yes    Alcohol/week: 0.0 oz    Comment: occasional- socially   . Drug use: No  . Sexual activity: Not Currently  Lifestyle  . Physical activity:    Days per week: 7 days    Minutes per session: 50 min  . Stress: Only a little  Relationships  . Social connections:    Talks on phone: More than three times a week    Gets together: More than three times a week    Attends religious service: More than 4 times per year    Active member of club or organization: Yes    Attends meetings of clubs or organizations: More than 4 times per year    Relationship status: Married  Other Topics Concern  . Not on file  Social History Narrative  . Not on file    Outpatient Encounter Medications as of 09/15/2017  Medication Sig  . buPROPion (WELLBUTRIN XL) 150 MG 24 hr tablet TAKE ONE TABLET BY MOUTH ONE TIME DAILY   . celecoxib (CELEBREX) 200 MG capsule 1 by mouth daily  . cholecalciferol (VITAMIN D) 1000 UNITS tablet Take 1 tablet (1,000 Units total) by mouth daily.  . famotidine (PEPCID) 20 MG tablet Take 20 mg by mouth daily as needed for heartburn or indigestion.  Marland Kitchen levocetirizine (XYZAL) 5 MG tablet Take 5 mg by mouth daily.  . montelukast (SINGULAIR) 10 MG tablet Take 10 mg by mouth at bedtime.  . Multiple Vitamin (MULTIVITAMIN WITH MINERALS) TABS Take 1 tablet by mouth daily.  Marland Kitchen Zoster Vaccine Adjuvanted [facility]) injection Inject 0.5 mLs into the muscle once for 1 dose.  . [DISCONTINUED] Zoster Vaccine Adjuvanted [facility]) injection Inject 0.5 mLs into the muscle once.  Marland Kitchen aspirin EC 81 MG tablet Take 81 mg by mouth daily.   Facility-Administered Encounter Medications as of 09/15/2017  Medication  . 0.9 %  sodium chloride infusion    Activities of Daily Living In your present state of health, do you have any difficulty performing the following activities: 09/15/2017  Hearing? N    Vision? N  Difficulty concentrating or making decisions? N  Walking or climbing stairs? Y  Dressing or bathing? N  Doing errands, shopping? N  Preparing Food and eating ? N  Using the Toilet? N  In the past six months, have you accidently leaked urine? N  Do you have problems with loss of bowel control? N  Managing your Medications? N  Managing  your Finances? N  Housekeeping or managing your Housekeeping? N  Some recent data might be hidden    Patient Care Team: Lauree Chandler, NP as PCP - General (Nurse Practitioner) Tiajuana Amass, MD as Referring Physician (Allergy and Immunology) Wylene Simmer, MD as Consulting Physician (Orthopedic Surgery) Roseanne Kaufman, MD as Consulting Physician (Orthopedic Surgery) Rolm Bookbinder, MD as Consulting Physician (Dermatology) Gatha Mayer, MD as Consulting Physician (Gastroenterology)    Assessment:   This is a routine wellness examination for [place].  Exercise Activities and Dietary recommendations Current Exercise Habits: Home exercise routine, Time (Minutes): 50, Frequency (Times/Week): 7, Weekly Exercise (Minutes/Week): 350, Intensity: Mild, Exercise limited by: Other - see comments(knee pain)  Goals    None      Fall Risk Fall Risk  09/15/2017 09/15/2016 09/09/2016 02/25/2016 09/24/2015  Falls in the past year? No No No No No   Is the patient's home free of loose throw rugs in walkways, pet beds, electrical cords, etc?   yes      Grab bars in the bathroom? yes      Handrails on the stairs?   yes      Adequate lighting?   yes  Depression Screen PHQ 2/9 Scores 09/15/2017 09/15/2016 09/09/2016 08/23/2015  PHQ - 2 Score 0 0 0 3  PHQ- 9 Score - - - 9     Cognitive Function MMSE - Mini Mental State Exam 09/15/2017 09/09/2016 08/23/2015 08/21/2014  Orientation to time 4 5 5 5   Orientation to Place 5 5 5 5   Registration 3 3 3 3   Attention/ Calculation 5 5 5 5   Recall 3 2 3 3   Language- name 2 objects 2 2 2 2   Language- repeat 1 1 1 1    Language- follow 3 step command 3 3 3 3   Language- read & follow direction 1 1 1 1   Write a sentence 1 1 1 1   Copy design 1 1 1 1   Total score 29 29 30 30         Immunization History  Administered Date(s) Administered  . Influenza, High Dose Seasonal PF 11/27/2015  . Influenza-Unspecified 01/13/2013, 11/26/2013, 11/21/2014, 11/26/2016  . Pneumococcal Conjugate-13 08/21/2014  . Pneumococcal-Unspecified 05/30/2008, 02/11/2012  . Tdap 10/04/2012  . Zoster 04/21/2005    Qualifies for Shingles Vaccine? Due ordered to pharmacy  Screening Tests Health Maintenance  Topic Date Due  . INFLUENZA VACCINE  09/10/2017  . Hepatitis C Screening  09/10/2023 (Originally [birth year])  . MAMMOGRAM  03/04/2019  . TETANUS/TDAP  10/05/2022  . COLONOSCOPY  05/16/2026  . DEXA SCAN  Completed  . PNA vac Low Risk Adult  Completed    Cancer Screenings: Lung: Low Dose CT Chest recommended if Age 78-80 years, 30 pack-year currently smoking OR have quit w/in 15years. Patient does not qualify. Breast:  Up to date on Mammogram? Yes   Up to date of Bone Density/Dexa? Yes Colorectal: up to date  Additional Screenings:  Hepatitis C Screening: declined    Plan:    I have personally reviewed and addressed the Medicare Annual Wellness questionnaire and have noted the following in the patient's chart:  A. Medical and social history B. Use of alcohol, tobacco or illicit drugs  C. Current medications and supplements D. Functional ability and status E.  Nutritional status F.  Physical activity G. Advance directives H. List of other physicians I.  Hospitalizations, surgeries, and ER visits in previous 12 months J.  Vitals K. Screenings to include hearing, vision, cognitive, depression  L. Referrals and appointments - none  In addition, I have reviewed and discussed with patient certain preventive protocols, quality metrics, and best practice recommendations. A written personalized care plan for preventive  services as well as general preventive health recommendations were provided to patient.  See attached scanned questionnaire for additional information.   Signed,   [name], RN Nurse Health Advisor  Patient Concerns: None

## 2017-09-15 NOTE — Patient Instructions (Signed)
[name] , Thank you for taking time to come for your Medicare Wellness Visit. I appreciate your ongoing commitment to your health goals. Please review the following plan we discussed and let me know if I can assist you in the future.   Screening recommendations/referrals: Colonoscopy up to date, completed Mammogram up to date, last on 03/03/2017 Bone Density up to date, due 10/01/2017 Recommended yearly ophthalmology/optometry visit for glaucoma screening and checkup Recommended yearly dental visit for hygiene and checkup  Vaccinations: Influenza vaccine up to date, due 2019 fall season Pneumococcal vaccine up to date, completed Tdap vaccine up to date, due 10/05/2022 Shingles vaccine due, ordered to pharmacy    Advanced directives: in chart  Conditions/risks identified: none  Next appointment: Brandi Mustache, NP 11/27/2017 @ 8:30am             Brandi Dense, RN 09/17/2018 @ 10am   Preventive Care 65 Years and Older, Female Preventive care refers to lifestyle choices and visits with your health care provider that can promote health and wellness. What does preventive care include?  A yearly physical exam. This is also called an annual well check.  Dental exams once or twice a year.  Routine eye exams. Ask your health care provider how often you should have your eyes checked.  Personal lifestyle choices, including:  Daily care of your teeth and gums.  Regular physical activity.  Eating a healthy diet.  Avoiding tobacco and drug use.  Limiting alcohol use.  Practicing safe sex.  Taking low-dose aspirin every day.  Taking vitamin and mineral supplements as recommended by your health care provider. What happens during an annual well check? The services and screenings done by your health care provider during your annual well check will depend on your age, overall health, lifestyle risk factors, and family history of disease. Counseling  Your health care provider may  ask you questions about your:  Alcohol use.  Tobacco use.  Drug use.  Emotional well-being.  Home and relationship well-being.  Sexual activity.  Eating habits.  History of falls.  Memory and ability to understand (cognition).  Work and work Statistician.  Reproductive health. Screening  You may have the following tests or measurements:  Height, weight, and BMI.  Blood pressure.  Lipid and cholesterol levels. These may be checked every 5 years, or more frequently if you are over 50 years old.  Skin check.  Lung cancer screening. You may have this screening every year starting at age 67 if you have a 30-pack-year history of smoking and currently smoke or have quit within the past 15 years.  Fecal occult blood test (FOBT) of the stool. You may have this test every year starting at age 25.  Flexible sigmoidoscopy or colonoscopy. You may have a sigmoidoscopy every 5 years or a colonoscopy every 10 years starting at age 54.  Hepatitis C blood test.  Hepatitis B blood test.  Sexually transmitted disease (STD) testing.  Diabetes screening. This is done by checking your blood sugar (glucose) after you have not eaten for a while (fasting). You may have this done every 1-3 years.  Bone density scan. This is done to screen for osteoporosis. You may have this done starting at age 100.  Mammogram. This may be done every 1-2 years. Talk to your health care provider about how often you should have regular mammograms. Talk with your health care provider about your test results, treatment options, and if necessary, the need for more tests. Vaccines  Your  health care provider may recommend certain vaccines, such as:  Influenza vaccine. This is recommended every year.  Tetanus, diphtheria, and acellular pertussis (Tdap, Td) vaccine. You may need a Td booster every 10 years.  Zoster vaccine. You may need this after age 9.  Pneumococcal 13-valent conjugate (PCV13) vaccine. One  dose is recommended after age 9.  Pneumococcal polysaccharide (PPSV23) vaccine. One dose is recommended after age 31. Talk to your health care provider about which screenings and vaccines you need and how often you need them. This information is not intended to replace advice given to you by your health care provider. Make sure you discuss any questions you have with your health care provider. Document Released: 02/23/2015 Document Revised: 10/17/2015 Document Reviewed: 11/28/2014 Elsevier Interactive Patient Education  2017 [place] Prevention in the Home Falls can cause injuries. They can happen to people of all ages. There are many things you can do to make your home safe and to help prevent falls. What can I do on the outside of my home?  Regularly fix the edges of walkways and driveways and fix any cracks.  Remove anything that might make you trip as you walk through a door, such as a raised step or threshold.  Trim any bushes or trees on the path to your home.  Use bright outdoor lighting.  Clear any walking paths of anything that might make someone trip, such as rocks or tools.  Regularly check to see if handrails are loose or broken. Make sure that both sides of any steps have handrails.  Any raised decks and porches should have guardrails on the edges.  Have any leaves, snow, or ice cleared regularly.  Use sand or salt on walking paths during winter.  Clean up any spills in your garage right away. This includes oil or grease spills. What can I do in the bathroom?  Use night lights.  Install grab bars by the toilet and in the tub and shower. Do not use towel bars as grab bars.  Use non-skid mats or decals in the tub or shower.  If you need to sit down in the shower, use a plastic, non-slip stool.  Keep the floor dry. Clean up any water that spills on the floor as soon as it happens.  Remove soap buildup in the tub or shower regularly.  Attach bath  mats securely with double-sided non-slip rug tape.  Do not have throw rugs and other things on the floor that can make you trip. What can I do in the bedroom?  Use night lights.  Make sure that you have a light by your bed that is easy to reach.  Do not use any sheets or blankets that are too big for your bed. They should not hang down onto the floor.  Have a firm chair that has side arms. You can use this for support while you get dressed.  Do not have throw rugs and other things on the floor that can make you trip. What can I do in the kitchen?  Clean up any spills right away.  Avoid walking on wet floors.  Keep items that you use a lot in easy-to-reach places.  If you need to reach something above you, use a strong step stool that has a grab bar.  Keep electrical cords out of the way.  Do not use floor polish or wax that makes floors slippery. If you must use wax, use non-skid floor wax.  Do not  have throw rugs and other things on the floor that can make you trip. What can I do with my stairs?  Do not leave any items on the stairs.  Make sure that there are handrails on both sides of the stairs and use them. Fix handrails that are broken or loose. Make sure that handrails are as long as the stairways.  Check any carpeting to make sure that it is firmly attached to the stairs. Fix any carpet that is loose or worn.  Avoid having throw rugs at the top or bottom of the stairs. If you do have throw rugs, attach them to the floor with carpet tape.  Make sure that you have a light switch at the top of the stairs and the bottom of the stairs. If you do not have them, ask someone to add them for you. What else can I do to help prevent falls?  Wear shoes that:  Do not have high heels.  Have rubber bottoms.  Are comfortable and fit you well.  Are closed at the toe. Do not wear sandals.  If you use a stepladder:  Make sure that it is fully opened. Do not climb a closed  stepladder.  Make sure that both sides of the stepladder are locked into place.  Ask someone to hold it for you, if possible.  Clearly mark and make sure that you can see:  Any grab bars or handrails.  First and last steps.  Where the edge of each step is.  Use tools that help you move around (mobility aids) if they are needed. These include:  Canes.  Walkers.  Scooters.  Crutches.  Turn on the lights when you go into a dark area. Replace any light bulbs as soon as they burn out.  Set up your furniture so you have a clear path. Avoid moving your furniture around.  If any of your floors are uneven, fix them.  If there are any pets around you, be aware of where they are.  Review your medicines with your doctor. Some medicines can make you feel dizzy. This can increase your chance of falling. Ask your doctor what other things that you can do to help prevent falls. This information is not intended to replace advice given to you by your health care provider. Make sure you discuss any questions you have with your health care provider. Document Released: 11/23/2008 Document Revised: 07/05/2015 Document Reviewed: 03/03/2014 Elsevier Interactive Patient Education  2017 [employer].

## 2017-09-16 ENCOUNTER — Other Ambulatory Visit: Payer: Self-pay

## 2017-09-16 ENCOUNTER — Ambulatory Visit (HOSPITAL_COMMUNITY): Payer: Medicare Other

## 2017-09-16 ENCOUNTER — Encounter (HOSPITAL_COMMUNITY): Payer: Self-pay

## 2017-09-16 DIAGNOSIS — Z4789 Encounter for other orthopedic aftercare: Secondary | ICD-10-CM | POA: Diagnosis not present

## 2017-09-16 DIAGNOSIS — M1711 Unilateral primary osteoarthritis, right knee: Secondary | ICD-10-CM | POA: Diagnosis not present

## 2017-09-16 DIAGNOSIS — M25661 Stiffness of right knee, not elsewhere classified: Secondary | ICD-10-CM

## 2017-09-16 DIAGNOSIS — M25561 Pain in right knee: Secondary | ICD-10-CM | POA: Diagnosis not present

## 2017-09-16 NOTE — Therapy (Signed)
[name], [place], [address] Phone: [phone]   Fax:  [phone]  Physical Therapy Treatment  Patient Details  Name: [name] MRN: [id number] Date of Birth: 1945/03/01 Referring Provider: Netta Cedars, MD    Encounter Date: 09/16/2017  PT End of Session - 09/16/17 1322    Visit Number  8    Number of Visits  16    Date for PT Re-Evaluation  10/19/17 reassessed on 09/14/17    Authorization Type  [employer]; with secondary: 53 visits total for PT/OT/ST     Authorization Time Period  08/19/17 - 10/20/17    Authorization - Visit Number  8    Authorization - Number of Visits  60    PT Start Time  6599    PT Stop Time  1346    PT Time Calculation (min)  41 min    Equipment Utilized During Treatment  Gait belt    Activity Tolerance  Patient tolerated treatment well;No increased pain    Behavior During Therapy  WFL for tasks assessed/performed       Past Medical History:  Diagnosis Date  . Allergy   . Contact dermatitis and other eczema, due to unspecified cause   . Encounter for long-term (current) use of other medications   . Hyperlipidemia   . Hypertension    been resolved >1 year.  . Other and unspecified hyperlipidemia   . Pain in joint, ankle and foot   . Screening for malignant neoplasm of the cervix     Past Surgical History:  Procedure Laterality Date  . ANKLE SURGERY  05/2006   Right Ankle  . Carpel TUnnel Release  [id number]   [id number]), Left(2006)  . CATARACT EXTRACTION Left 10/03/14  . CATARACT EXTRACTION Right 11/16/2014  . COLONOSCOPY    . DIAGNOSTIC LAPAROSCOPY  1980  . JOINT REPLACEMENT     Total Knee(L)-11/05/07  . KNEE ARTHROPLASTY  05/2007   Left  . POLYPECTOMY    . RHINOPLASTY  1969  . TONSILLECTOMY     @ 72 y.o.  . TOTAL ANKLE ARTHROPLASTY  10/30/2011   Procedure: TOTAL ANKLE ARTHOPLASTY;  Surgeon: Wylene Simmer, MD;  Location: [facility];  Service: Orthopedics;  Laterality: Right;   RIGHT TOTAL ANKLE REPLACEMENT, POSSIBLE GASTROC RESESSION  . TUBAL LIGATION  1980    There were no vitals filed for this visit.  Subjective Assessment - 09/16/17 1307    Subjective  Patietn reports she saw her MD today and he is plannign to perform Rt TKA on 9/20 or possible 1 week ealier. She is not sure when she'll want to schedule becasue her husband has a trip comming up and she doesn't want him to have to reschedule.     Limitations  Walking    How long can you sit comfortably?  5-10 minutes    How long can you stand comfortably?  no relavent limitations at this time.      How long can you walk comfortably?  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     Currently in Pain?  Yes    Pain Score  2     Pain Location  Knee    Pain Orientation  Right  Pain Descriptors / Indicators  Aching    Pain Type  Chronic pain    Pain Onset  More than a month ago    Pain Frequency  Constant    Aggravating Factors   walking, standing up initially        [facility] Adult PT Treatment/Exercise - 09/16/17 0001      Knee/Hip Exercises: Machines for Strengthening   Cybex Knee Flexion  2x 10 reps with plate 2     Other Machine  [employer] leg pres: 2x 10 reps with plate 2 for weight, Rt LE only      Knee/Hip Exercises: Standing   Heel Raises  20 reps;Limitations    Heel Raises Limitations  1x 20 reps toe raises on decline    Knee Flexion  Right;AROM;1 set;15 reps    Other Standing Knee Exercises  sidestepping: 2Rt with GTB, 15'      Knee/Hip Exercises: Seated   Long Arc Quad  Right;2 sets;Limitations;Weights;15 reps    Long Arc Quad Weight  3 lbs.    Long [employer] Limitations  3 second holds      Knee/Hip Exercises: Supine   Bridges  Both;1 set;15 reps    Single Leg Bridge  -- attempted but  patient unable to raise fully with Rt LE      Knee/Hip Exercises: Sidelying   Clams  1x 10 reps on Rt LE with green theraband, 3 secodn holds      Knee/Hip Exercises: Prone   Hip Extension  Strengthening;Both;1 set;10 reps      Manual Therapy   Manual Therapy  Joint mobilization    Manual therapy comments  completed seperate from all other skilled interventions        PT Education - 09/16/17 1401    Education Details  educated on new exercises for HEP.    Person(s) Educated  Patient    Methods  Explanation    Comprehension  Verbalized understanding       PT Short Term Goals - 09/14/17 1314      PT SHORT TERM GOAL #1   Title  ROM to improve 8-125 degrees or greater.     Baseline  7/15: 6-130 degrees    Status  Achieved        PT Long Term Goals - 09/14/17 1314      PT LONG TERM GOAL #1   Title  5/5 strength in Rt quads and hamstrings; 4/5 in hip abductors bilat.     Time  8    Period  Weeks    Status  On-going      PT LONG TERM GOAL #2   Title  5xSTS hands free performed in under 12 seconds.     Time  8    Period  Weeks    Status  Achieved      PT LONG TERM GOAL #3   Title  Pt AMB freely in the community without falls anxiety.     Time  8    Period  Weeks    Status  Achieved      PT LONG TERM GOAL #4   Title  BBT score > 50/56 to demonstrate reduced falls risk.     Period  Days    Status  Achieved      PT LONG TERM GOAL #5   Title  Pt would like to be able to rise from the floor from a half/kneeing or lunge position.     Baseline  patient  requires UE support on mat table/chair    Time  8    Period  Weeks    Status  Partially Met        Plan - 09/16/17 1310    Clinical Impression Statement  Continued with focus on LE strengthening in [place] and CKC movements to prepare for upcoming TKA. Patient met with her orthopedic surgeon this morning and is planning to have surgery on 9/20 or possible 1 week earlier. She continues to have greatest pain with Rt knee  flexion and with WB on Rt LE. Manual interventiosn were performed with grade 1 AP glide to Rt tibfem joint for pain management. Attempted to progress to singe limb bridge but patient unable to perform with full height and adde proneh ip extension to HEP instead. She will continue to benefit from skilled PT interventions to progress strength prior to TKA to improve recovery.    Rehab Potential  Good    PT Frequency  2x / week    PT Duration  8 weeks    PT Treatment/Interventions  Gait training;Stair training;Moist Heat;Electrical Stimulation;Cryotherapy;Functional mobility training;Patient/family education;Manual techniques;Passive range of motion;Dry needling    PT Next Visit Plan  Continue with focuse on strengthening Bil LE's and perform seated knee distraction for pain relief next session.    PT Home Exercise Plan  At eval: standing knee flexion, Supine SAQ, hooklying bridge, seated LAQ; 09/16/17 - prone hip extension, clamshell    Consulted and Agree with Plan of Care  Patient       Patient will benefit from skilled therapeutic intervention in order to improve the following deficits and impairments:  Abnormal gait, Decreased knowledge of use of DME, Pain, Decreased mobility, Postural dysfunction, Decreased activity tolerance, Decreased balance, Decreased range of motion, Decreased strength, Difficulty walking, Increased edema, Obesity  Visit Diagnosis: Acute pain of right knee  Stiffness of right knee, not elsewhere classified     Problem List Patient Active Problem List   Diagnosis Date Noted  . Obesity (BMI 30.0-34.9) 04/30/2017  . 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 Vaginal dryness, menopausal 08/21/2014  . Osteopenia 12/14/2013  . Essential hypertension 12/14/2013  . Complex regional pain syndrome of lower limb 10/04/2012  . Hyperlipidemia 10/04/2012  . Allergic rhinitis 10/04/2012  . Generalized osteoarthrosis, involving multiple sites 10/04/2012    Kipp Brood, PT, DPT Physical Therapist with [facility]  09/16/2017 2:06 PM    [place], [place], [address] Phone: [phone]   Fax:  [phone]  Name: HANAE WAITERS MRN: [id number] Date of Birth: July 30, 1945

## 2017-09-18 ENCOUNTER — Encounter (HOSPITAL_COMMUNITY): Payer: Medicare Other

## 2017-09-21 ENCOUNTER — Encounter (HOSPITAL_COMMUNITY): Payer: Self-pay

## 2017-09-21 ENCOUNTER — Ambulatory Visit (HOSPITAL_COMMUNITY): Payer: Medicare Other

## 2017-09-21 ENCOUNTER — Other Ambulatory Visit: Payer: Self-pay

## 2017-09-21 DIAGNOSIS — M25661 Stiffness of right knee, not elsewhere classified: Secondary | ICD-10-CM | POA: Diagnosis not present

## 2017-09-21 DIAGNOSIS — M25561 Pain in right knee: Secondary | ICD-10-CM

## 2017-09-21 NOTE — Therapy (Signed)
[place], [place], [address] Phone: [phone]   Fax:  [phone]  Physical Therapy Treatment  Patient Details  Name: [name] MRN: [id number] Date of Birth: [birth year] Referring Provider: Netta Cedars, MD    Encounter Date: 09/21/2017  PT End of Session - 09/21/17 0957    Visit Number  9    Number of Visits  16    Date for PT Re-Evaluation  10/19/17   reassessed on 09/14/17   Authorization Type  [employer]; with secondary: 59 visits total for PT/OT/ST     Authorization Time Period  08/19/17 - 10/20/17    Authorization - Visit Number  9    Authorization - Number of Visits  29    PT Start Time  5520   patient arrived late   PT Stop Time  1029    PT Time Calculation (min)  34 min    Equipment Utilized During Treatment  Gait belt    Activity Tolerance  Patient tolerated treatment well;No increased pain    Behavior During Therapy  WFL for tasks assessed/performed       Past Medical History:  Diagnosis Date  . Allergy   . Contact dermatitis and other eczema, due to unspecified cause   . Encounter for long-term (current) use of other medications   . Hyperlipidemia   . Hypertension    been resolved >1 year.  . Other and unspecified hyperlipidemia   . Pain in joint, ankle and foot   . Screening for malignant neoplasm of the cervix     Past Surgical History:  Procedure Laterality Date  . ANKLE SURGERY  05/2006   Right Ankle  . Carpel TUnnel Release  [id number]   [id number]), Left(2006)  . CATARACT EXTRACTION Left 10/03/14  . CATARACT EXTRACTION Right 11/16/2014  . COLONOSCOPY    . DIAGNOSTIC LAPAROSCOPY  1980  . JOINT REPLACEMENT     Total Knee(L)-11/05/07  . KNEE ARTHROPLASTY  05/2007   Left  . POLYPECTOMY    . RHINOPLASTY  1969  . TONSILLECTOMY     @ 72 y.o.  . TOTAL ANKLE ARTHROPLASTY  10/30/2011   Procedure: TOTAL ANKLE ARTHOPLASTY;  Surgeon: Wylene Simmer, MD;  Location: [place];  Service:  Orthopedics;  Laterality: Right;  RIGHT TOTAL ANKLE REPLACEMENT, POSSIBLE GASTROC RESESSION  . TUBAL LIGATION  1980    There were no vitals filed for this visit.  Subjective Assessment - 09/21/17 0958    Subjective  Patient reports she is doing well today. She states pain is about 1/10 and that she believes the CBD oil is helping with her pain. She went to a dog show this weekend and did not have to do too much walking so her knee did not bother her too much.    Limitations  Walking    How long can you sit comfortably?  5-10 minutes    How long can you stand comfortably?  no relavent limitations at this time.      How long can you walk comfortably?  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     Currently in Pain?  Yes    Pain Score  1     Pain Location  Knee  Pain Orientation  Right    Pain Descriptors / Indicators  Aching    Pain Type  Chronic pain    Pain Onset  More than a month ago    Aggravating Factors   walking standing    Pain Relieving Factors  ice, CBD oil        [facility] Adult PT Treatment/Exercise - 09/21/17 0001      Knee/Hip Exercises: Stretches   Gastroc Stretch  Both;3 reps;30 seconds    Gastroc Stretch Limitations  slant board      Knee/Hip Exercises: Higher education careers adviser leg pres: 2x 10 reps with plate 2 for weight, Rt LE only      Knee/Hip Exercises: Standing   Heel Raises  20 reps;Limitations    Heel Raises Limitations  1x 20 reps toe raises on decline    Forward Lunges  Both;Limitations;15 reps    Forward Lunges Limitations  no step with bil UE support    Lateral Step Up  Both;1 set;15 reps;Hand Hold: 2;Step Height: 4"   light fingertip support for balance   Forward Step Up  Both;1 set;15 reps;Hand Hold: 0;Step Height: 4"     Wall Squat  1 set;5 reps    Wall Squat Limitations  attemtped and discontinued as patient had sharp Rt knee pain      Knee/Hip Exercises: Supine   Bridges  Both;1 set;15 reps    Straight Leg Raises  15 reps;Both;1 set;Limitations    Straight Leg Raises Limitations  3 second holds        PT Education - 09/21/17 1005    Education Details  Educated on exercise throughout session and purpose of interventions    Person(s) Educated  Patient    Methods  Explanation    Comprehension  Verbalized understanding       PT Short Term Goals - 09/14/17 1314      PT SHORT TERM GOAL #1   Title  ROM to improve 8-125 degrees or greater.     Baseline  7/15: 6-130 degrees    Status  Achieved        PT Long Term Goals - 09/14/17 1314      PT LONG TERM GOAL #1   Title  5/5 strength in Rt quads and hamstrings; 4/5 in hip abductors bilat.     Time  8    Period  Weeks    Status  On-going      PT LONG TERM GOAL #2   Title  5xSTS hands free performed in under 12 seconds.     Time  8    Period  Weeks    Status  Achieved      PT LONG TERM GOAL #3   Title  Pt AMB freely in the community without falls anxiety.     Time  8    Period  Weeks    Status  Achieved      PT LONG TERM GOAL #4   Title  BBT score > 50/56 to demonstrate reduced falls risk.     Period  Days    Status  Achieved      PT LONG TERM GOAL #5   Title  Pt would like to be able to rise from the floor from a half/kneeing or lunge position.     Baseline  patient requires UE support on mat table/chair    Time  8    Period  Weeks    Status  Partially Met        Plan - 09/21/17 0957    Clinical Impression Statement  Patient arrived late to session limiting time for therapy this date. She requires repeated verbal cues to perform exercise correctly and for correct repetition/duration. Session continued to focus on LE strengthening and progressed challenge with step-ups this session and patient was able to progress lateral  step up height and reduce UE assistance. Patient had pain with wall squats this session and exercise was discontinued after 5 reps. She will continue to benefit from skilled PT interventions to progress strength prior to TKA to improve recovery.    Rehab Potential  Good    PT Frequency  2x / week    PT Duration  8 weeks    PT Treatment/Interventions  Gait training;Stair training;Moist Heat;Electrical Stimulation;Cryotherapy;Functional mobility training;Patient/family education;Manual techniques;Passive range of motion;Dry needling    PT Next Visit Plan  Continue with focus on strengthening Bil LE's and perform seated knee distraction for pain relief next session. Hold off on wall squat/squats as they cause sharp knee pain.    PT Home Exercise Plan  At eval: standing knee flexion, Supine SAQ, hooklying bridge, seated LAQ; 09/16/17 - prone hip extension, clamshell    Consulted and Agree with Plan of Care  Patient       Patient will benefit from skilled therapeutic intervention in order to improve the following deficits and impairments:  Abnormal gait, Decreased knowledge of use of DME, Pain, Decreased mobility, Postural dysfunction, Decreased activity tolerance, Decreased balance, Decreased range of motion, Decreased strength, Difficulty walking, Increased edema, Obesity  Visit Diagnosis: Acute pain of right knee  Stiffness of right knee, not elsewhere classified     Problem List Patient Active Problem List   Diagnosis Date Noted  . Obesity (BMI 30.0-34.9) 04/30/2017  . 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 Vaginal dryness, menopausal 08/21/2014  . Osteopenia 12/14/2013  . Essential hypertension 12/14/2013  . Complex regional pain syndrome of lower limb 10/04/2012  . Hyperlipidemia 10/04/2012  . Allergic rhinitis 10/04/2012  . Generalized osteoarthrosis, involving multiple sites 10/04/2012    Kipp Brood, PT, DPT Physical Therapist with [facility]  09/21/2017 10:30 AM    [place], [place], [address] Phone: [phone]   Fax:  [phone]  Name: MCKYNLIE VANDERSLICE MRN: [id number] Date of Birth: 1945-09-23

## 2017-09-23 ENCOUNTER — Other Ambulatory Visit: Payer: Self-pay

## 2017-09-23 ENCOUNTER — Encounter (HOSPITAL_COMMUNITY): Payer: Self-pay

## 2017-09-23 ENCOUNTER — Ambulatory Visit (HOSPITAL_COMMUNITY): Payer: Medicare Other

## 2017-09-23 DIAGNOSIS — M25661 Stiffness of right knee, not elsewhere classified: Secondary | ICD-10-CM

## 2017-09-23 DIAGNOSIS — M25561 Pain in right knee: Secondary | ICD-10-CM | POA: Diagnosis not present

## 2017-09-23 NOTE — Therapy (Signed)
[place], [place], [address] Phone: [phone]   Fax:  [phone]  Physical Therapy Treatment  Patient Details  Name: [name] MRN: [id number] Date of Birth: [birth year] Referring Provider: Netta Cedars, MD    Encounter Date: 09/23/2017  PT End of Session - 09/23/17 0953    Visit Number  10    Number of Visits  16    Date for PT Re-Evaluation  10/19/17   reassessed on 09/14/17   Authorization Type  [employer]; with secondary: 60 visits total for PT/OT/ST     Authorization Time Period  08/19/17 - 10/20/17    Authorization - Visit Number  10   3/10 since last progress note   Authorization - Number of Visits  60    PT Start Time  0949    PT Stop Time  1027    PT Time Calculation (min)  38 min    Equipment Utilized During Treatment  Gait belt    Activity Tolerance  Patient tolerated treatment well;No increased pain    Behavior During Therapy  WFL for tasks assessed/performed       Past Medical History:  Diagnosis Date  . Allergy   . Contact dermatitis and other eczema, due to unspecified cause   . Encounter for long-term (current) use of other medications   . Hyperlipidemia   . Hypertension    been resolved >1 year.  . Other and unspecified hyperlipidemia   . Pain in joint, ankle and foot   . Screening for malignant neoplasm of the cervix     Past Surgical History:  Procedure Laterality Date  . ANKLE SURGERY  05/2006   Right Ankle  . Carpel TUnnel Release  [id number]   [id number]), Left(2006)  . CATARACT EXTRACTION Left 10/03/14  . CATARACT EXTRACTION Right 11/16/2014  . COLONOSCOPY    . DIAGNOSTIC LAPAROSCOPY  1980  . JOINT REPLACEMENT     Total Knee(L)-11/05/07  . KNEE ARTHROPLASTY  05/2007   Left  . POLYPECTOMY    . RHINOPLASTY  1969  . TONSILLECTOMY     @ 72 y.o.  . TOTAL ANKLE ARTHROPLASTY  10/30/2011   Procedure: TOTAL ANKLE ARTHOPLASTY;  Surgeon: Wylene Simmer, MD;  Location: [facility];   Service: Orthopedics;  Laterality: Right;  RIGHT TOTAL ANKLE REPLACEMENT, POSSIBLE GASTROC RESESSION  . TUBAL LIGATION  1980    There were no vitals filed for this visit.  Subjective Assessment - 09/23/17 0951    Subjective  Patient reports she is sore from her last session but states it is just soreness not pain. She is having about 1/10 pain in her Rt knee and none in her Lt knee. Patient reports she had her deep water aerobics class this morning and states she thinks that helped with her pain and decreased her stiffness this morning.    Limitations  Walking    How long can you sit comfortably?  5-10 minutes    How long can you stand comfortably?  no relavent limitations at this time.      How long can you walk comfortably?  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     Currently in Pain?  Yes    Pain Score  1     Pain Location  Knee    Pain Orientation  Right    Pain Descriptors / Indicators  Aching    Pain Type  Chronic pain    Pain Onset  More than a month ago    Pain Frequency  Constant    Aggravating Factors   walking, standign prolonged periods    Pain Relieving Factors  CBD oil, ice       [facility] Adult PT Treatment/Exercise - 09/23/17 0001      Knee/Hip Exercises: Machines for Strengthening   Other Machine  [employer] leg pres: 2x 12 reps with plate 2 for weight, Rt LE only      Knee/Hip Exercises: Standing   Heel Raises  20 reps;Limitations    Heel Raises Limitations  1x 20 reps toe raises on decline    Knee Flexion  Right;AROM;1 set;15 reps    Lateral Step Up  Both;1 set;15 reps;Hand Hold: 2;Step Height: 4"    Forward Step Up  Both;1 set;15 reps;Step Height: 4";Hand Hold: 2      Knee/Hip Exercises: Supine   Bridges  Both;15 reps;2 sets      Knee/Hip Exercises:  Sidelying   Hip ABduction  Both;2 sets;15 reps    Hip ABduction Limitations  cues to perform "up and back" to target hip extension/abduction combined movement    Clams  Reverse clamshell: 1x 15 reps bil LE   3 sec holds     Knee/Hip Exercises: Prone   Hip Extension  Strengthening;Both;1 set;15 reps   3 second holds     Manual Therapy   Manual Therapy  Joint mobilization    Manual therapy comments  completed seperate from all other skilled interventions    Joint Mobilization  AP glide to Rt tibfem joint with distraction in seated, garde II, 3x 30-45 seconds       PT Education - 09/23/17 0952    Education Details  Edcuated on exercise throughout and encouraged to call scheduling office for TKA as she has not heard form them in over a week.    Person(s) Educated  Patient    Methods  Explanation    Comprehension  Verbalized understanding;Returned demonstration       PT Short Term Goals - 09/14/17 1314      PT SHORT TERM GOAL #1   Title  ROM to improve 8-125 degrees or greater.     Baseline  7/15: 6-130 degrees    Status  Achieved        PT Long Term Goals - 09/14/17 1314      PT LONG TERM GOAL #1   Title  5/5 strength in Rt quads and hamstrings; 4/5 in hip abductors bilat.     Time  8    Period  Weeks    Status  On-going      PT LONG TERM GOAL #2   Title  5xSTS hands free performed in under 12 seconds.     Time  8    Period  Weeks    Status  Achieved      PT LONG TERM GOAL #3   Title  Pt AMB freely in the community without falls anxiety.     Time  8    Period  Weeks    Status  Achieved      PT LONG TERM GOAL #4   Title  BBT score > 50/56 to demonstrate reduced falls risk.     Period  Days  Status  Achieved      PT LONG TERM GOAL #5   Title  Pt would like to be able to rise from the floor from a half/kneeing or lunge position.     Baseline  patient requires UE support on mat table/chair    Time  8    Period  Weeks    Status  Partially Met        Plan  - 09/23/17 0953    Clinical Impression Statement  Continued with standing and seated functional strengthening for Bil LE with focus placed on Rt LE. Patient complained of increased knee pain with step-ups this session. Manual joint mobilization with distraction to tibfem joint on Rt knee performed to relieve pain, patient reported positive response to intervention. She has pain with eccentric exercises on Rt knee and has difficulty unlocking knee from extension. She will continue to benefit from skilled PT interventions to progress strength prior to TKA to improve recovery.    Rehab Potential  Good    PT Frequency  2x / week    PT Duration  8 weeks    PT Treatment/Interventions  Gait training;Stair training;Moist Heat;Electrical Stimulation;Cryotherapy;Functional mobility training;Patient/family education;Manual techniques;Passive range of motion;Dry needling    PT Next Visit Plan  Continue with focus on strengthening Bil LE's and perform seated knee distraction for pain relief next session. Hold off on wall squat/squats as they cause sharp knee pain. Continue with hip strengthening to prepare for TKE.    PT Home Exercise Plan  At eval: standing knee flexion, Supine SAQ, hooklying bridge, seated LAQ; 09/16/17 - prone hip extension, clamshell; 09/23/17 - reverse clamshell    Consulted and Agree with Plan of Care  Patient       Patient will benefit from skilled therapeutic intervention in order to improve the following deficits and impairments:  Abnormal gait, Decreased knowledge of use of DME, Pain, Decreased mobility, Postural dysfunction, Decreased activity tolerance, Decreased balance, Decreased range of motion, Decreased strength, Difficulty walking, Increased edema, Obesity  Visit Diagnosis: Acute pain of right knee  Stiffness of right knee, not elsewhere classified     Problem List Patient Active Problem List   Diagnosis Date Noted  . Obesity (BMI 30.0-34.9) 04/30/2017  .  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 Vaginal dryness, menopausal 08/21/2014  . Osteopenia 12/14/2013  . Essential hypertension 12/14/2013  . Complex regional pain syndrome of lower limb 10/04/2012  . Hyperlipidemia 10/04/2012  . Allergic rhinitis 10/04/2012  . Generalized osteoarthrosis, involving multiple sites 10/04/2012    Kipp Brood, PT, DPT Physical Therapist with [facility]  09/23/2017 10:29 AM    [place], [place], [address] Phone: [phone]   Fax:  [phone]  Name: ETOSHA WETHERELL MRN: [id number] Date of Birth: 07/12/1945

## 2017-09-23 NOTE — Patient Instructions (Signed)
  Exercises  Sidelying Reverse Clamshell - 10-20 reps - 2-3 sets - 3 seconds hold - 1x daily - 7x weekly

## 2017-09-28 ENCOUNTER — Encounter (HOSPITAL_COMMUNITY): Payer: Medicare Other | Admitting: Physical Therapy

## 2017-09-30 ENCOUNTER — Ambulatory Visit (HOSPITAL_COMMUNITY): Payer: Medicare Other | Admitting: Physical Therapy

## 2017-09-30 DIAGNOSIS — M25661 Stiffness of right knee, not elsewhere classified: Secondary | ICD-10-CM | POA: Diagnosis not present

## 2017-09-30 DIAGNOSIS — M25561 Pain in right knee: Secondary | ICD-10-CM

## 2017-09-30 NOTE — Therapy (Signed)
[facility], [place], [address] Phone: [phone]   Fax:  [phone]  Physical Therapy Treatment  Patient Details  Name: [name] MRN: [id number] Date of Birth: [birth year] Referring Provider: Netta Cedars, MD    Encounter Date: 09/30/2017  PT End of Session - 09/30/17 1025    Visit Number  11    Number of Visits  16    Date for PT Re-Evaluation  10/19/17   reassessed on 09/14/17   Authorization Type  [employer]; with secondary: 68 visits total for PT/OT/ST     Authorization Time Period  08/19/17 - 10/20/17    Authorization - Visit Number  11   3/10 since last progress note   Authorization - Number of Visits  60    PT Start Time  0950    PT Stop Time  1032    PT Time Calculation (min)  42 min    Equipment Utilized During Treatment  Gait belt    Activity Tolerance  Patient tolerated treatment well;No increased pain    Behavior During Therapy  WFL for tasks assessed/performed       Past Medical History:  Diagnosis Date  . Allergy   . Contact dermatitis and other eczema, due to unspecified cause   . Encounter for long-term (current) use of other medications   . Hyperlipidemia   . Hypertension    been resolved >1 year.  . Other and unspecified hyperlipidemia   . Pain in joint, ankle and foot   . Screening for malignant neoplasm of the cervix     Past Surgical History:  Procedure Laterality Date  . ANKLE SURGERY  05/2006   Right Ankle  . Carpel TUnnel Release  [id number]   [id number]), Left(2006)  . CATARACT EXTRACTION Left 10/03/14  . CATARACT EXTRACTION Right 11/16/2014  . COLONOSCOPY    . DIAGNOSTIC LAPAROSCOPY  1980  . JOINT REPLACEMENT     Total Knee(L)-11/05/07  . KNEE ARTHROPLASTY  05/2007   Left  . POLYPECTOMY    . RHINOPLASTY  1969  . TONSILLECTOMY     @ 72 y.o.  . TOTAL ANKLE ARTHROPLASTY  10/30/2011   Procedure: TOTAL ANKLE ARTHOPLASTY;  Surgeon: Wylene Simmer, MD;  Location: [facility];   Service: Orthopedics;  Laterality: Right;  RIGHT TOTAL ANKLE REPLACEMENT, POSSIBLE GASTROC RESESSION  . TUBAL LIGATION  1980    There were no vitals filed for this visit.  Subjective Assessment - 09/30/17 1005    Subjective  Pt states she has more pain when she is up walking on it, Rt mainly 2/10.  States she has her TKA surgery scheduled for 10/30/17 and wishes to continue therapy until then to get her LE's stronger.      Currently in Pain?  Yes    Pain Score  2     Pain Location  Knee    Pain Orientation  Right    Pain Descriptors / Indicators  Aching    Pain Type  Chronic pain                       [facility] Adult PT Treatment/Exercise - 09/30/17 0001      Knee/Hip Exercises: Standing   Heel Raises  20 reps;Limitations    Heel Raises Limitations  1x 20 reps toe raises on decline    Knee Flexion  Right;AROM;1 set;15 reps    Forward Lunges  Both;Limitations;15 reps    Forward Lunges  Limitations  no step with bil UE support    Lateral Step Up  Both;1 set;15 reps;Hand Hold: 2;Step Height: 4"    Forward Step Up  Both;1 set;15 reps;Step Height: 4";Hand Hold: 2    SLS with Vectors  10X5" on bil LE with 1 HHA    Other Standing Knee Exercises  sidestepping: 2Rt with GTB, 15'      Knee/Hip Exercises: Supine   Bridges  Both;15 reps;2 sets    Straight Leg Raises  15 reps;Both;1 set;Limitations    Straight Leg Raises Limitations  3 second holds      Knee/Hip Exercises: Sidelying   Hip ABduction  Both;15 reps;1 set               PT Short Term Goals - 09/14/17 1314      PT SHORT TERM GOAL #1   Title  ROM to improve 8-125 degrees or greater.     Baseline  7/15: 6-130 degrees    Status  Achieved        PT Long Term Goals - 09/14/17 1314      PT LONG TERM GOAL #1   Title  5/5 strength in Rt quads and hamstrings; 4/5 in hip abductors bilat.     Time  8    Period  Weeks    Status  On-going      PT LONG TERM GOAL #2   Title  5xSTS hands free performed in  under 12 seconds.     Time  8    Period  Weeks    Status  Achieved      PT LONG TERM GOAL #3   Title  Pt AMB freely in the community without falls anxiety.     Time  8    Period  Weeks    Status  Achieved      PT LONG TERM GOAL #4   Title  BBT score > 50/56 to demonstrate reduced falls risk.     Period  Days    Status  Achieved      PT LONG TERM GOAL #5   Title  Pt would like to be able to rise from the floor from a half/kneeing or lunge position.     Baseline  patient requires UE support on mat table/chair    Time  8    Period  Weeks    Status  Partially Met            Plan - 09/30/17 1025    Clinical Impression Statement  continued with general strengthening/stab for bilateral LE's in preparation for Rt TKA on 9/20.    Able to complete all exercises without pain except for discomfort completing eccentric phase of step ups.  will continue to avoid exercises that increase pain and proceed with strengthening within painfree ROM.     Rehab Potential  Good    PT Frequency  2x / week    PT Duration  8 weeks    PT Treatment/Interventions  Gait training;Stair training;Moist Heat;Electrical Stimulation;Cryotherapy;Functional mobility training;Patient/family education;Manual techniques;Passive range of motion;Dry needling    PT Next Visit Plan  Continue with focus on strengthening Bil LE's and perform seated knee distraction for pain relief PRN. Hold off on wall squat/squats as they cause sharp knee pain. Continue with hip strengthening to prepare for TKA.    PT Home Exercise Plan  At eval: standing knee flexion, Supine SAQ, hooklying bridge, seated LAQ; 09/16/17 - prone hip extension, clamshell; 09/23/17 - reverse  clamshell    Consulted and Agree with Plan of Care  Patient       Patient will benefit from skilled therapeutic intervention in order to improve the following deficits and impairments:  Abnormal gait, Decreased knowledge of use of DME, Pain, Decreased mobility, Postural  dysfunction, Decreased activity tolerance, Decreased balance, Decreased range of motion, Decreased strength, Difficulty walking, Increased edema, Obesity  Visit Diagnosis: Acute pain of right knee  Stiffness of right knee, not elsewhere classified     Problem List Patient Active Problem List   Diagnosis Date Noted  . Obesity (BMI 30.0-34.9) 04/30/2017  . 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 Vaginal dryness, menopausal 08/21/2014  . Osteopenia 12/14/2013  . Essential hypertension 12/14/2013  . Complex regional pain syndrome of lower limb 10/04/2012  . Hyperlipidemia 10/04/2012  . Allergic rhinitis 10/04/2012  . Generalized osteoarthrosis, involving multiple sites 10/04/2012   Teena Irani, PTA/CLT [phone]  Teena Irani 09/30/2017, 10:36 AM  [place], [place], [address] Phone: [phone]   Fax:  [phone]  Name: MATEYA TORTI MRN: [id number] Date of Birth: 02/17/1945

## 2017-10-01 ENCOUNTER — Encounter: Payer: Self-pay | Admitting: Internal Medicine

## 2017-10-01 ENCOUNTER — Other Ambulatory Visit: Payer: Self-pay | Admitting: Nurse Practitioner

## 2017-10-01 DIAGNOSIS — F32A Depression, unspecified: Secondary | ICD-10-CM

## 2017-10-01 DIAGNOSIS — M8589 Other specified disorders of bone density and structure, multiple sites: Secondary | ICD-10-CM | POA: Diagnosis not present

## 2017-10-01 DIAGNOSIS — F329 Major depressive disorder, single episode, unspecified: Secondary | ICD-10-CM

## 2017-10-01 LAB — HM DEXA SCAN

## 2017-10-02 DIAGNOSIS — Z131 Encounter for screening for diabetes mellitus: Secondary | ICD-10-CM | POA: Diagnosis not present

## 2017-10-02 DIAGNOSIS — E559 Vitamin D deficiency, unspecified: Secondary | ICD-10-CM | POA: Diagnosis not present

## 2017-10-02 DIAGNOSIS — Z136 Encounter for screening for cardiovascular disorders: Secondary | ICD-10-CM | POA: Diagnosis not present

## 2017-10-02 DIAGNOSIS — R5383 Other fatigue: Secondary | ICD-10-CM | POA: Diagnosis not present

## 2017-10-02 DIAGNOSIS — Z01818 Encounter for other preprocedural examination: Secondary | ICD-10-CM | POA: Diagnosis not present

## 2017-10-05 ENCOUNTER — Ambulatory Visit (HOSPITAL_COMMUNITY): Payer: Medicare Other | Admitting: Physical Therapy

## 2017-10-05 DIAGNOSIS — M25561 Pain in right knee: Secondary | ICD-10-CM

## 2017-10-05 DIAGNOSIS — M25661 Stiffness of right knee, not elsewhere classified: Secondary | ICD-10-CM | POA: Diagnosis not present

## 2017-10-05 NOTE — Therapy (Signed)
[facility] [place], [place], [address] Phone: [phone]   Fax:  [phone]  Physical Therapy Treatment  Patient Details  Name: [name] MRN: [id number] Date of Birth: Aug 24, 1945 Referring Provider: Netta Cedars, MD    Encounter Date: 10/05/2017  PT End of Session - 10/05/17 1428    Visit Number  12    Number of Visits  16    Date for PT Re-Evaluation  10/19/17   reassessed on 09/14/17   Authorization Type  [employer]; with secondary: 51 visits total for PT/OT/ST     Authorization Time Period  08/19/17 - 10/20/17    Authorization - Visit Number  12   3/10 since last progress note   Authorization - Number of Visits  60    PT Start Time  0950    PT Stop Time  1030    PT Time Calculation (min)  40 min    Equipment Utilized During Treatment  Gait belt    Activity Tolerance  Patient tolerated treatment well;No increased pain    Behavior During Therapy  WFL for tasks assessed/performed       Past Medical History:  Diagnosis Date  . Allergy   . Contact dermatitis and other eczema, due to unspecified cause   . Encounter for long-term (current) use of other medications   . Hyperlipidemia   . Hypertension    been resolved >1 year.  . Other and unspecified hyperlipidemia   . Pain in joint, ankle and foot   . Screening for malignant neoplasm of the cervix     Past Surgical History:  Procedure Laterality Date  . ANKLE SURGERY  05/2006   Right Ankle  . Carpel TUnnel Release  [id number]   [id number]), Left(2006)  . CATARACT EXTRACTION Left 10/03/14  . CATARACT EXTRACTION Right 11/16/2014  . COLONOSCOPY    . DIAGNOSTIC LAPAROSCOPY  1980  . JOINT REPLACEMENT     Total Knee(L)-11/05/07  . KNEE ARTHROPLASTY  05/2007   Left  . POLYPECTOMY    . RHINOPLASTY  1969  . TONSILLECTOMY     @ 72 y.o.  . TOTAL ANKLE ARTHROPLASTY  10/30/2011   Procedure: TOTAL ANKLE ARTHOPLASTY;  Surgeon: Wylene Simmer, MD;  Location: [facility];   Service: Orthopedics;  Laterality: Right;  RIGHT TOTAL ANKLE REPLACEMENT, POSSIBLE GASTROC RESESSION  . TUBAL LIGATION  1980    There were no vitals filed for this visit.  Subjective Assessment - 10/05/17 1010    Subjective  Pt states her pain is 2/10 today.    Pertinent History  TKA sceduled or 9/20    Currently in Pain?  Yes                       [facility] Adult PT Treatment/Exercise - 10/05/17 0001      Knee/Hip Exercises: Machines for Strengthening   Other Machine  [employer] leg pres: 2x 15 reps with plate 2 for weight, Rt LE only      Knee/Hip Exercises: Standing   Heel Raises  20 reps;Limitations    Heel Raises Limitations  1x 20 reps toe raises on decline    Forward Lunges  Both;Limitations;15 reps    Forward Lunges Limitations  no step with 1 UE support    Lateral Step Up  Both;1 set;15 reps;Hand Hold: 2;Step Height: 4"    Forward Step Up  Both;1 set;15 reps;Step Height: 4";Hand Hold: 2    Step Down  10 reps;Hand Hold: 1;Step Height: 2";Both    SLS with Vectors  10X5" on bil LE with 1 HHA    Other Standing Knee Exercises  sidestepping: 2Rt with GTB, 15'      Knee/Hip Exercises: Supine   Bridges  Both;15 reps;2 sets    Straight Leg Raises  15 reps;Both;1 set;Limitations      Knee/Hip Exercises: Sidelying   Hip ABduction  Both;15 reps;1 set               PT Short Term Goals - 09/14/17 1314      PT SHORT TERM GOAL #1   Title  ROM to improve 8-125 degrees or greater.     Baseline  7/15: 6-130 degrees    Status  Achieved        PT Long Term Goals - 09/14/17 1314      PT LONG TERM GOAL #1   Title  5/5 strength in Rt quads and hamstrings; 4/5 in hip abductors bilat.     Time  8    Period  Weeks    Status  On-going      PT LONG TERM GOAL #2   Title  5xSTS hands free performed in under 12 seconds.     Time  8    Period  Weeks    Status  Achieved      PT LONG TERM GOAL #3   Title  Pt AMB freely in the community without falls anxiety.      Time  8    Period  Weeks    Status  Achieved      PT LONG TERM GOAL #4   Title  BBT score > 50/56 to demonstrate reduced falls risk.     Period  Days    Status  Achieved      PT LONG TERM GOAL #5   Title  Pt would like to be able to rise from the floor from a half/kneeing or lunge position.     Baseline  patient requires UE support on mat table/chair    Time  8    Period  Weeks    Status  Partially Met            Plan - 10/05/17 1429    Clinical Impression Statement  Current therex continued to increased strength in preparation for Rt TKA on 9/20.  Pt able to complete step up activities with increased ease with continued discomfort during eccentric phase.  Added step downs back into POC and increased reps of vector actvitiy to further improve hip strength.  PT reported no increased pain or change at end of session.      Rehab Potential  Good    PT Frequency  2x / week    PT Duration  8 weeks    PT Treatment/Interventions  Gait training;Stair training;Moist Heat;Electrical Stimulation;Cryotherapy;Functional mobility training;Patient/family education;Manual techniques;Passive range of motion;Dry needling    PT Next Visit Plan  Continue with focus on strengthening Bil LE's and perform seated knee distraction for pain relief PRN. Hold off on wall squat/squats as they cause sharp knee pain. Continue with hip strengthening to prepare for TKA.    PT Home Exercise Plan  At eval: standing knee flexion, Supine SAQ, hooklying bridge, seated LAQ; 09/16/17 - prone hip extension, clamshell; 09/23/17 - reverse clamshell    Consulted and Agree with Plan of Care  Patient       Patient will benefit from skilled therapeutic intervention in order to  improve the following deficits and impairments:  Abnormal gait, Decreased knowledge of use of DME, Pain, Decreased mobility, Postural dysfunction, Decreased activity tolerance, Decreased balance, Decreased range of motion, Decreased strength, Difficulty  walking, Increased edema, Obesity  Visit Diagnosis: Acute pain of right knee  Stiffness of right knee, not elsewhere classified     Problem List Patient Active Problem List   Diagnosis Date Noted  . Obesity (BMI 30.0-34.9) 04/30/2017  . 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 Vaginal dryness, menopausal 08/21/2014  . Osteopenia 12/14/2013  . Essential hypertension 12/14/2013  . Complex regional pain syndrome of lower limb 10/04/2012  . Hyperlipidemia 10/04/2012  . Allergic rhinitis 10/04/2012  . Generalized osteoarthrosis, involving multiple sites 10/04/2012   Teena Irani, PTA/CLT [phone]  Teena Irani 10/05/2017, 2:30 PM  [place] [place], [place], [address] Phone: [phone]   Fax:  [phone]  Name: [name] MRN: [id number] Date of Birth: 04/20/1945

## 2017-10-06 ENCOUNTER — Encounter: Payer: Self-pay | Admitting: *Deleted

## 2017-10-06 DIAGNOSIS — E559 Vitamin D deficiency, unspecified: Secondary | ICD-10-CM | POA: Diagnosis not present

## 2017-10-06 DIAGNOSIS — Z136 Encounter for screening for cardiovascular disorders: Secondary | ICD-10-CM | POA: Diagnosis not present

## 2017-10-06 DIAGNOSIS — Z01818 Encounter for other preprocedural examination: Secondary | ICD-10-CM | POA: Diagnosis not present

## 2017-10-06 DIAGNOSIS — Z131 Encounter for screening for diabetes mellitus: Secondary | ICD-10-CM | POA: Diagnosis not present

## 2017-10-06 DIAGNOSIS — R5383 Other fatigue: Secondary | ICD-10-CM | POA: Diagnosis not present

## 2017-10-07 ENCOUNTER — Telehealth: Payer: Self-pay

## 2017-10-07 ENCOUNTER — Encounter (HOSPITAL_COMMUNITY): Payer: Self-pay

## 2017-10-07 ENCOUNTER — Other Ambulatory Visit: Payer: Self-pay

## 2017-10-07 ENCOUNTER — Ambulatory Visit (HOSPITAL_COMMUNITY): Payer: Medicare Other

## 2017-10-07 DIAGNOSIS — M25561 Pain in right knee: Secondary | ICD-10-CM

## 2017-10-07 DIAGNOSIS — M25661 Stiffness of right knee, not elsewhere classified: Secondary | ICD-10-CM | POA: Diagnosis not present

## 2017-10-07 NOTE — Telephone Encounter (Signed)
I left a message asking that patient call the office regarding bone density results.   Per note written on report from Dr. Eulas Post: "Pt with low bone density (osteopenia) and at increased risk of fragility fracture. Recommend bisphosphonate (oral medication) treatment and calcium with vitamin D."   A copy of this report was placed in Xylan Sheils's folder in the clinical filing cabinet and the original was sent to scanning.

## 2017-10-07 NOTE — Therapy (Signed)
[facility], [place], [address] Phone: [phone]   Fax:  [phone]  Physical Therapy Treatment  Patient Details  Name: [name] MRN: [id number] Date of Birth: 1945/10/07 Referring Provider: Netta Cedars, MD    Encounter Date: 10/07/2017  PT End of Session - 10/07/17 0957    Visit Number  13    Number of Visits  16    Date for PT Re-Evaluation  10/19/17   reassessed on 09/14/17   Authorization Type  [employer]; with secondary: 39 visits total for PT/OT/ST     Authorization Time Period  08/19/17 - 10/20/17    Authorization - Visit Number  13   3/10 since last progress note   Authorization - Number of Visits  60    PT Start Time  0950    PT Stop Time  1029    PT Time Calculation (min)  39 min    Equipment Utilized During Treatment  Gait belt    Activity Tolerance  Patient tolerated treatment well;No increased pain    Behavior During Therapy  WFL for tasks assessed/performed       Past Medical History:  Diagnosis Date  . Allergy   . Contact dermatitis and other eczema, due to unspecified cause   . Encounter for long-term (current) use of other medications   . Hyperlipidemia   . Hypertension    been resolved >1 year.  . Other and unspecified hyperlipidemia   . Pain in joint, ankle and foot   . Screening for malignant neoplasm of the cervix     Past Surgical History:  Procedure Laterality Date  . ANKLE SURGERY  05/2006   Right Ankle  . Carpel TUnnel Release  [id number]   [id number]), Left(2006)  . CATARACT EXTRACTION Left 10/03/14  . CATARACT EXTRACTION Right 11/16/2014  . COLONOSCOPY    . DIAGNOSTIC LAPAROSCOPY  1980  . JOINT REPLACEMENT     Total Knee(L)-11/05/07  . KNEE ARTHROPLASTY  05/2007   Left  . POLYPECTOMY    . RHINOPLASTY  1969  . TONSILLECTOMY     @ 72 y.o.  . TOTAL ANKLE ARTHROPLASTY  10/30/2011   Procedure: TOTAL ANKLE ARTHOPLASTY;  Surgeon: Wylene Simmer, MD;  Location: [facility];   Service: Orthopedics;  Laterality: Right;  RIGHT TOTAL ANKLE REPLACEMENT, POSSIBLE GASTROC RESESSION  . TUBAL LIGATION  1980    There were no vitals filed for this visit.  Subjective Assessment - 10/07/17 0952    Subjective  Patient reports she is a little more sore today.    Pertinent History  TKA sceduled or 9/20    Limitations  Walking    How long can you sit comfortably?  5-10 minutes    How long can you stand comfortably?  no relavent limitations at this time.      How long can you walk comfortably?  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     Currently in Pain?  Yes    Pain Score  1     Pain Location  Knee    Pain Orientation  Right    Pain Descriptors / Indicators  Aching    Pain Type  Chronic pain    Pain Onset  More than a month ago    Pain Frequency  Constant    Aggravating Factors   weight bearing on Rt LE    Pain Relieving Factors  CBD oil        [facility] Adult PT Treatment/Exercise - 10/07/17 0001      Knee/Hip Exercises: Machines for Strengthening   Other Machine  [employer] leg pres: 2x 15 reps with plate 2 for weight, Rt LE only      Knee/Hip Exercises: Standing   Heel Raises  20 reps;Limitations    Heel Raises Limitations  1x 20 reps toe raises on decline    Forward Lunges  Both;Limitations;15 reps    Forward Lunges Limitations  4" step wtih bil UE support    Lateral Step Up  Both;1 set;15 reps;Hand Hold: 2;Step Height: 4"    Step Down  Step Height: 2";Both;1 set;15 reps;Hand Hold: 2    SLS with Vectors  10 reps bil LE, 3 way vector, 3 seconds each position      Knee/Hip Exercises: Supine   Straight Leg Raises  Limitations;1 set;Both;20 reps    Straight Leg Raises Limitations  3 second holds      Knee/Hip Exercises: Sidelying   Hip ABduction  Both;15  reps;1 set      Manual Therapy   Manual Therapy  Joint mobilization    Manual therapy comments  completed seperate from all other skilled interventions    Joint Mobilization  AP glide to Rt tibfem joint in supine, garde I/II, 3x 30-45 seconds; Grade I flide to patellofemoral jiont on Rt knee in supine, 3x 30-45 seconds for pain relief        PT Education - 10/07/17 1025    Education Details  Educated on exercises throughout and on purpose of joint mobilization. Discussed what therapy will be like after surgery and timeline of healing.     Person(s) Educated  Patient    Methods  Explanation    Comprehension  Verbalized understanding       PT Short Term Goals - 09/14/17 1314      PT SHORT TERM GOAL #1   Title  ROM to improve 8-125 degrees or greater.     Baseline  7/15: 6-130 degrees    Status  Achieved        PT Long Term Goals - 09/14/17 1314      PT LONG TERM GOAL #1   Title  5/5 strength in Rt quads and hamstrings; 4/5 in hip abductors bilat.     Time  8    Period  Weeks    Status  On-going      PT LONG TERM GOAL #2   Title  5xSTS hands free performed in under 12 seconds.     Time  8    Period  Weeks    Status  Achieved      PT LONG TERM GOAL #3   Title  Pt AMB freely in the community without falls anxiety.     Time  8    Period  Weeks    Status  Achieved      PT LONG TERM GOAL #4   Title  BBT score > 50/56 to demonstrate reduced falls risk.     Period  Days    Status  Achieved      PT LONG TERM GOAL #5   Title  Pt would like to be able to rise from the floor from a half/kneeing or lunge position.  Baseline  patient requires UE support on mat table/chair    Time  8    Period  Weeks    Status  Partially Met         Plan - 10/07/17 0959    Clinical Impression Statement  Continued with therapy focus on Rt LE strengthening and pain management in preparation for Rt TKA on 10/30/17. Patient attempted step down today however had increased sharp pain this  session with Rt LE and this was discontinued after ~ 5 reps. Patient reported pain relief with manual joint mobilization to tibfem/patellofemoral joint this session. She reported minimal change in pain form 1/10 to 2/10 at EOS and stated it was at a 6/10 prior to manual interventions. She will continue to benefit from skilled PT interventions to address impairments and progress functional strength prior to surgery.    Rehab Potential  Good    PT Frequency  2x / week    PT Duration  8 weeks    PT Treatment/Interventions  Gait training;Stair training;Moist Heat;Electrical Stimulation;Cryotherapy;Functional mobility training;Patient/family education;Manual techniques;Passive range of motion;Dry needling    PT Next Visit Plan  Continue with focus on strengthening Bil LE's and perform joint mobilization for pain relief PRN. Hold off on wall squat/squats as they cause sharp knee pain. Continue with hip strengthening to prepare for TKA. Add hip hike next session if patient can tolerate single limb Wb.    PT Home Exercise Plan  At eval: standing knee flexion, Supine SAQ, hooklying bridge, seated LAQ; 09/16/17 - prone hip extension, clamshell; 09/23/17 - reverse clamshell    Consulted and Agree with Plan of Care  Patient       Patient will benefit from skilled therapeutic intervention in order to improve the following deficits and impairments:  Abnormal gait, Decreased knowledge of use of DME, Pain, Decreased mobility, Postural dysfunction, Decreased activity tolerance, Decreased balance, Decreased range of motion, Decreased strength, Difficulty walking, Increased edema, Obesity  Visit Diagnosis: Acute pain of right knee  Stiffness of right knee, not elsewhere classified     Problem List Patient Active Problem List   Diagnosis Date Noted  . Obesity (BMI 30.0-34.9) 04/30/2017  . 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 Vaginal dryness, menopausal 08/21/2014  . Osteopenia 12/14/2013  .  Essential hypertension 12/14/2013  . Complex regional pain syndrome of lower limb 10/04/2012  . Hyperlipidemia 10/04/2012  . Allergic rhinitis 10/04/2012  . Generalized osteoarthrosis, involving multiple sites 10/04/2012    Kipp Brood, PT, DPT Physical Therapist with [facility]  10/07/2017 10:27 AM    [place], [place], [address] Phone: [phone]   Fax:  [phone]  Name: [name] MRN: [id number] Date of Birth: 03/31/1945

## 2017-10-14 ENCOUNTER — Ambulatory Visit (HOSPITAL_COMMUNITY): Payer: Medicare Other | Attending: Orthopedic Surgery

## 2017-10-14 ENCOUNTER — Other Ambulatory Visit: Payer: Self-pay

## 2017-10-14 ENCOUNTER — Encounter (HOSPITAL_COMMUNITY): Payer: Self-pay

## 2017-10-14 DIAGNOSIS — R262 Difficulty in walking, not elsewhere classified: Secondary | ICD-10-CM | POA: Diagnosis not present

## 2017-10-14 DIAGNOSIS — R600 Localized edema: Secondary | ICD-10-CM | POA: Diagnosis not present

## 2017-10-14 DIAGNOSIS — M25561 Pain in right knee: Secondary | ICD-10-CM | POA: Diagnosis not present

## 2017-10-14 DIAGNOSIS — M6281 Muscle weakness (generalized): Secondary | ICD-10-CM | POA: Insufficient documentation

## 2017-10-14 DIAGNOSIS — M25661 Stiffness of right knee, not elsewhere classified: Secondary | ICD-10-CM | POA: Diagnosis not present

## 2017-10-14 NOTE — Therapy (Signed)
[place], [place], [address] Phone: [phone]   Fax:  [phone]  Physical Therapy Treatment  Patient Details  Name: [name] MRN: [id number] Date of Birth: 1945/09/03 Referring Provider: Netta Cedars, MD    Encounter Date: 10/14/2017  PT End of Session - 10/14/17 0954    Visit Number  14    Number of Visits  16    Date for PT Re-Evaluation  10/19/17   reassessed on 09/14/17   Authorization Type  [employer]; with secondary: 13 visits total for PT/OT/ST     Authorization Time Period  08/19/17 - 10/20/17    Authorization - Visit Number  14   3/10 since last progress note   Authorization - Number of Visits  60    PT Start Time  0949    PT Stop Time  1031    PT Time Calculation (min)  42 min    Equipment Utilized During Treatment  Gait belt    Activity Tolerance  Patient tolerated treatment well;No increased pain    Behavior During Therapy  WFL for tasks assessed/performed       Past Medical History:  Diagnosis Date  . Allergy   . Contact dermatitis and other eczema, due to unspecified cause   . Encounter for long-term (current) use of other medications   . Hyperlipidemia   . Hypertension    been resolved >1 year.  . Other and unspecified hyperlipidemia   . Pain in joint, ankle and foot   . Screening for malignant neoplasm of the cervix     Past Surgical History:  Procedure Laterality Date  . ANKLE SURGERY  05/2006   Right Ankle  . Carpel TUnnel Release  [id number]   [id number]), Left(2006)  . CATARACT EXTRACTION Left 10/03/14  . CATARACT EXTRACTION Right 11/16/2014  . COLONOSCOPY    . DIAGNOSTIC LAPAROSCOPY  1980  . JOINT REPLACEMENT     Total Knee(L)-11/05/07  . KNEE ARTHROPLASTY  05/2007   Left  . POLYPECTOMY    . RHINOPLASTY  1969  . TONSILLECTOMY     @ 72 y.o.  . TOTAL ANKLE ARTHROPLASTY  10/30/2011   Procedure: TOTAL ANKLE ARTHOPLASTY;  Surgeon: Wylene Simmer, MD;  Location: [facility];   Service: Orthopedics;  Laterality: Right;  RIGHT TOTAL ANKLE REPLACEMENT, POSSIBLE GASTROC RESESSION  . TUBAL LIGATION  1980    There were no vitals filed for this visit.  Subjective Assessment - 10/14/17 0951    Subjective  Patient reports she had a busy weekend and spent at least 7 hours on her feet Sunday preparing for a family memebers birthday party.     Pertinent History  TKA sceduled or 9/20    Limitations  Walking    How long can you sit comfortably?  5-10 minutes    How long can you stand comfortably?  no relavent limitations at this time.      How long can you walk comfortably?  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     Currently in Pain?  No/denies       [facility] Adult PT Treatment/Exercise - 10/14/17 0001      Knee/Hip Exercises: Machines for Firefighter  leg pres: 2x 15 reps with plate 2 for 1st set, plate 3 for 2nd set, Rt LE only      Knee/Hip Exercises: Standing   Heel Raises  20 reps;Limitations    Heel Raises Limitations  1x 20 reps toe raises on decline    Forward Lunges  Both;Limitations;15 reps    Forward Lunges Limitations  4" step wtih bil UE support    Lateral Step Up  Both;1 set;15 reps;Step Height: 4";Hand Hold: 1    Forward Step Up  Both;1 set;15 reps;Step Height: 4";Hand Hold: 0    SLS  Hip hike: 1x 15 reps on stairs with Bil UE support    SLS with Vectors  10 reps bil LE, 3 way vector, 3 seconds each position    Other Standing Knee Exercises  sidestepping with Red TB at ankles, 3x RT 15"      Manual Therapy   Joint Mobilization  AP glide to Rt tibfem joint in supine, garde I/II, 3x 30-45 seconds; Grade I flide to patellofemoral jiont on Rt knee in supine, 3x 30-45 seconds for pain relief        PT Education - 10/14/17  1357    Education Details  Edcuated on exercises throughout session and on new exercise for HEP.    Person(s) Educated  Patient    Methods  Explanation;Handout    Comprehension  Verbalized understanding;Returned demonstration       PT Short Term Goals - 09/14/17 1314      PT SHORT TERM GOAL #1   Title  ROM to improve 8-125 degrees or greater.     Baseline  7/15: 6-130 degrees    Status  Achieved        PT Long Term Goals - 09/14/17 1314      PT LONG TERM GOAL #1   Title  5/5 strength in Rt quads and hamstrings; 4/5 in hip abductors bilat.     Time  8    Period  Weeks    Status  On-going      PT LONG TERM GOAL #2   Title  5xSTS hands free performed in under 12 seconds.     Time  8    Period  Weeks    Status  Achieved      PT LONG TERM GOAL #3   Title  Pt AMB freely in the community without falls anxiety.     Time  8    Period  Weeks    Status  Achieved      PT LONG TERM GOAL #4   Title  BBT score > 50/56 to demonstrate reduced falls risk.     Period  Days    Status  Achieved      PT LONG TERM GOAL #5   Title  Pt would like to be able to rise from the floor from a half/kneeing or lunge position.     Baseline  patient requires UE support on mat table/chair    Time  8    Period  Weeks    Status  Partially Met        Plan - 10/14/17 0954    Clinical Impression Statement  Therapy focus remains on bil LE strengthening and pain relief for Rt knee. Patient progressed strengthening today by advancing weight for leg press and increasing repetitions throughout. She performed hip hike exercises with good tolerance to SLS weight bearing on Rt LE, exercise added to HEP. Manual joint mobilization was performed  at EOS for pain relief and patient reported 0/10 at EOS following treatment. She will continue to benefit from skilled PT interventions to address impairments and progress functional strength prior to surgery.    Rehab Potential  Good    PT Frequency  2x / week    PT  Duration  8 weeks    PT Treatment/Interventions  Gait training;Stair training;Moist Heat;Electrical Stimulation;Cryotherapy;Functional mobility training;Patient/family education;Manual techniques;Passive range of motion;Dry needling    PT Next Visit Plan  Continue with focus on strengthening Bil LE's and perform joint mobilization for pain relief PRN. Continue with hip strengthening to prepare for TKA.     PT Home Exercise Plan  At eval: standing knee flexion, Supine SAQ, hooklying bridge, seated LAQ; 09/16/17 - prone hip extension, clamshell; 09/23/17 - reverse clamshell    Consulted and Agree with Plan of Care  Patient       Patient will benefit from skilled therapeutic intervention in order to improve the following deficits and impairments:  Abnormal gait, Decreased knowledge of use of DME, Pain, Decreased mobility, Postural dysfunction, Decreased activity tolerance, Decreased balance, Decreased range of motion, Decreased strength, Difficulty walking, Increased edema, Obesity  Visit Diagnosis: Acute pain of right knee  Stiffness of right knee, not elsewhere classified     Problem List Patient Active Problem List   Diagnosis Date Noted  . Obesity (BMI 30.0-34.9) 04/30/2017  . 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 Vaginal dryness, menopausal 08/21/2014  . Osteopenia 12/14/2013  . Essential hypertension 12/14/2013  . Complex regional pain syndrome of lower limb 10/04/2012  . Hyperlipidemia 10/04/2012  . Allergic rhinitis 10/04/2012  . Generalized osteoarthrosis, involving multiple sites 10/04/2012    Kipp Brood, PT, DPT Physical Therapist with [facility]  10/14/2017 1:57 PM    [place], [place], [address] Phone: [phone]   Fax:  [phone]  Name: CHELLY DOMBECK MRN: [id number] Date of Birth: 16-Jul-1945

## 2017-10-19 ENCOUNTER — Encounter (HOSPITAL_COMMUNITY)
Admission: RE | Admit: 2017-10-19 | Discharge: 2017-10-19 | Disposition: A | Discharge status: Home / Self Care | Payer: Medicare Other | Source: Ambulatory Visit | Attending: Orthopedic Surgery | Admitting: Orthopedic Surgery

## 2017-10-19 ENCOUNTER — Other Ambulatory Visit: Payer: Self-pay

## 2017-10-19 ENCOUNTER — Ambulatory Visit (HOSPITAL_COMMUNITY): Payer: Medicare Other | Admitting: Physical Therapy

## 2017-10-19 ENCOUNTER — Encounter (HOSPITAL_COMMUNITY): Payer: Self-pay

## 2017-10-19 DIAGNOSIS — M25661 Stiffness of right knee, not elsewhere classified: Secondary | ICD-10-CM

## 2017-10-19 DIAGNOSIS — M25561 Pain in right knee: Secondary | ICD-10-CM

## 2017-10-19 DIAGNOSIS — Z01812 Encounter for preprocedural laboratory examination: Secondary | ICD-10-CM | POA: Insufficient documentation

## 2017-10-19 HISTORY — DX: Depression, unspecified: F32.A

## 2017-10-19 HISTORY — DX: Unspecified osteoarthritis, unspecified site: M19.90

## 2017-10-19 HISTORY — DX: Major depressive disorder, single episode, unspecified: F32.9

## 2017-10-19 LAB — CBC
HCT: 46.1 % — ABNORMAL HIGH (ref 36.0–46.0)
Hemoglobin: 14.7 g/dL (ref 12.0–15.0)
MCH: 29.1 pg (ref 26.0–34.0)
MCHC: 31.9 g/dL (ref 30.0–36.0)
MCV: 91.3 fL (ref 78.0–100.0)
PLATELETS: 231 10*3/uL (ref 150–400)
RBC: 5.05 MIL/uL (ref 3.87–5.11)
RDW: 12.3 % (ref 11.5–15.5)
WBC: 9.6 10*3/uL (ref 4.0–10.5)

## 2017-10-19 LAB — SURGICAL PCR SCREEN
MRSA, PCR: NEGATIVE
STAPHYLOCOCCUS AUREUS: NEGATIVE

## 2017-10-19 NOTE — Pre-Procedure Instructions (Addendum)
[name]  10/19/2017      [facility] [address] [place], [place] [address] Phone: [phone] Fax: [phone]  [facility], [place] [address] [place] [address] Phone: [phone] Fax: [place], [place] [address] [place] [place] [address] Phone: [phone] Fax: [phone]    Your procedure is scheduled on 10-30-2017 Friday .  Report to [facility] Admitting at 8:00 A.M.   Call this number if you have problems the morning of surgery:  [phone]   Remember:  Do not eat  Food or drink liquids after midnight   STOP TAKING ANY ASPIRIN(UNLESS OTHERWISE INSTRUCTED BY YOUR SURGEON),ANTIINFLAMATORIES (IBUPROFEN,ALEVE,MOTRIN,ADVIL,GOODY'S POWDERS),HERBAL SUPPLEMENTS,FISH OIL,AND VITAMINS 5-7 DAYS PRIOR TO SURGERY                           Take these medicines the morning of surgery with A SIP OF WATER    Bupropion)Wellbutrin) Famotidine (Pepcid) if needed Xyzal Eye drops if needed       Do not wear jewelry, make-up or nail polish.  Do not wear lotions, powders, or perfumes, or deodorant.  Do not shave 48 hours prior to surgery.  Men may shave face and neck.  Do not bring valuables to the hospital.  [facility] is not responsible for any belongings or valuables.  Contacts, dentures or bridgework may not be worn into surgery.  Leave your suitcase in the car.  After surgery it may be brought to your room.  For patients admitted to the hospital, discharge time will be determined by your treatment team.  Patients discharged the day of surgery will not be allowed to drive home.   [facility] - Preparing for Surgery  Before surgery, you can play an important role.  Because skin is not sterile, your skin needs to be as  free of germs as possible.  You can reduce the number of germs on you skin by washing with CHG (chlorahexidine gluconate) soap before surgery.  CHG is an antiseptic cleaner which kills germs and bonds with the skin to continue killing germs even after washing.  Oral Hygiene is also important in reducing the risk of infection.  Remember to brush your teeth with your regular toothpaste the morning of surgery.  Please DO NOT use if you have an allergy to CHG or antibacterial soaps.  If your skin becomes reddened/irritated stop using the CHG and inform your nurse when you arrive at Short Stay.  Do not shave (including legs and underarms) for at least 48 hours prior to the first CHG shower.  You may shave your face.  Please follow these instructions carefully:   1.  Shower with CHG Soap the night before surgery and the morning of Surgery.  2.  If you choose to wash your hair, wash your hair first as usual with your normal shampoo.  3.  After you shampoo, rinse your hair and body thoroughly to remove the shampoo. 4.  Use CHG as you would any other liquid soap.  You can apply chg directly to the skin and wash gently with a      scrungie or washcloth.           5.  Apply the  CHG Soap to your body ONLY FROM THE NECK DOWN.   Do not use on open wounds or open sores. Avoid contact with your eyes, ears, mouth and genitals (private parts).  Wash genitals (private parts) with your normal soap.  6.  Wash thoroughly, paying special attention to the area where your surgery will be performed.  7.  Thoroughly rinse your body with warm water from the neck down.  8.  DO NOT shower/wash with your normal soap after using and rinsing off the CHG Soap.  9.  Pat yourself dry with a clean towel.            10.  Wear clean pajamas.            11.  Place clean sheets on your bed the night of your first shower and do not sleep with pets.  Day of Surgery  Do not apply any lotions/deoderants the morning of surgery.   Please  wear clean clothes to the hospital/surgery center. Remember to brush your teeth with toothpaste.   Please read over the following fact sheets that you were given. Coughing and Deep Breathing, MRSA Information and Surgical Site Infection Prevention

## 2017-10-19 NOTE — Therapy (Addendum)
[place], [place], [address] Phone: [phone]   Fax:  [phone]  Physical Therapy Treatment  Patient Details  Name: [name] MRN: [id number] Date of Birth: 1945-03-30 Referring Provider: Netta Cedars, MD    Encounter Date: 10/19/2017  PT End of Session - 10/19/17 1024    Visit Number  15    Number of Visits  16    Date for PT Re-Evaluation  10/22/17   reassessed on 09/14/17   Authorization Type  [employer]; with secondary: 60 visits total for PT/OT/ST     Authorization Time Period  08/19/17 - 10/20/17    Authorization - Visit Number  15   3/10 since last progress note   Authorization - Number of Visits  60    PT Start Time  0950    PT Stop Time  1032    PT Time Calculation (min)  42 min    Equipment Utilized During Treatment  Gait belt    Activity Tolerance  Patient tolerated treatment well;No increased pain    Behavior During Therapy  WFL for tasks assessed/performed       Past Medical History:  Diagnosis Date  . Allergy   . Contact dermatitis and other eczema, due to unspecified cause   . Encounter for long-term (current) use of other medications   . Hyperlipidemia   . Hypertension    been resolved >1 year.  . Other and unspecified hyperlipidemia   . Pain in joint, ankle and foot   . Screening for malignant neoplasm of the cervix     Past Surgical History:  Procedure Laterality Date  . ANKLE SURGERY  05/2006   Right Ankle  . Carpel TUnnel Release  [id number]   [id number]), Left(2006)  . CATARACT EXTRACTION Left 10/03/14  . CATARACT EXTRACTION Right 11/16/2014  . COLONOSCOPY    . DIAGNOSTIC LAPAROSCOPY  1980  . JOINT REPLACEMENT     Total Knee(L)-11/05/07  . KNEE ARTHROPLASTY  05/2007   Left  . POLYPECTOMY    . RHINOPLASTY  1969  . TONSILLECTOMY     @ 72 y.o.  . TOTAL ANKLE ARTHROPLASTY  10/30/2011   Procedure: TOTAL ANKLE ARTHOPLASTY;  Surgeon: Wylene Simmer, MD;  Location: [facility];   Service: Orthopedics;  Laterality: Right;  RIGHT TOTAL ANKLE REPLACEMENT, POSSIBLE GASTROC RESESSION  . TUBAL LIGATION  1980    There were no vitals filed for this visit.  Subjective Assessment - 10/19/17 0955    Subjective  Pt states she has been worse since last visit.  States the pounding exercises (weight bearing, i.e. step ups)  she believes are irritating it.  Sttates she is ready for her surgery next week.     Currently in Pain?  Yes    Pain Score  3     Pain Location  Knee    Pain Orientation  Right    Pain Descriptors / Indicators  Aching    Pain Type  Chronic pain                       [facility] Adult PT Treatment/Exercise - 10/19/17 0001      Knee/Hip Exercises: Machines for Strengthening   Other Machine  [employer] leg press: 2x 15 reps with plate 2 , Rt LE only      Knee/Hip Exercises: Standing   Heel Raises  20 reps;Limitations    Heel Raises Limitations  1x 20 reps toe raises  on decline    Forward Lunges  Both;Limitations;15 reps    Forward Lunges Limitations  4" step wtih bil UE support    SLS  Hip hike: 1x 15 reps on stairs with Bil UE support    SLS with Vectors  10 reps bil LE, 3 way vector, 3 seconds each position    Other Standing Knee Exercises  sidestepping with Red TB at ankles, 3x RT 15"      Knee/Hip Exercises: Supine   Bridges  15 reps;Limitations    Bridges Limitations  with holds    Straight Leg Raises  Limitations;1 set;Both;20 reps      Knee/Hip Exercises: Sidelying   Hip ABduction  Both;15 reps;1 set      Manual Therapy   Joint Mobilization  AP glide to Rt tibfem joint in supine, garde I/II, 3x 30-45 seconds; Grade I flide to patellofemoral jiont on Rt knee in supine, 3x 30-45 seconds for pain relief               PT Short Term Goals - 09/14/17 1314      PT SHORT TERM GOAL #1   Title  ROM to improve 8-125 degrees or greater.     Baseline  7/15: 6-130 degrees    Status  Achieved        PT Long Term Goals - 09/14/17  1314      PT LONG TERM GOAL #1   Title  5/5 strength in Rt quads and hamstrings; 4/5 in hip abductors bilat.     Time  8    Period  Weeks    Status  On-going      PT LONG TERM GOAL #2   Title  5xSTS hands free performed in under 12 seconds.     Time  8    Period  Weeks    Status  Achieved      PT LONG TERM GOAL #3   Title  Pt AMB freely in the community without falls anxiety.     Time  8    Period  Weeks    Status  Achieved      PT LONG TERM GOAL #4   Title  BBT score > 50/56 to demonstrate reduced falls risk.     Period  Days    Status  Achieved      PT LONG TERM GOAL #5   Title  Pt would like to be able to rise from the floor from a half/kneeing or lunge position.     Baseline  patient requires UE support on mat table/chair    Time  8    Period  Weeks    Status  Partially Met            Plan - 10/19/17 1151    Clinical Impression Statement  continued focus on strengthening in painfree ROM.  Held off on step ups/downs due to increased pain wiht weight bearing.  discussed next session being last before her surgery and pateint agreeable.     Rehab Potential  Good    PT Frequency  2x / week    PT Duration  8 weeks    PT Treatment/Interventions  Gait training;Stair training;Moist Heat;Electrical Stimulation;Cryotherapy;Functional mobility training;Patient/family education;Manual techniques;Passive range of motion;Dry needling    PT Next Visit Plan  Re-evaluate next session.    PT Home Exercise Plan  At eval: standing knee flexion, Supine SAQ, hooklying bridge, seated LAQ; 09/16/17 - prone hip extension, clamshell; 09/23/17 - reverse  clamshell    Consulted and Agree with Plan of Care  Patient       Patient will benefit from skilled therapeutic intervention in order to improve the following deficits and impairments:  Abnormal gait, Decreased knowledge of use of DME, Pain, Decreased mobility, Postural dysfunction, Decreased activity tolerance, Decreased balance, Decreased  range of motion, Decreased strength, Difficulty walking, Increased edema, Obesity  Visit Diagnosis: Acute pain of right knee  Stiffness of right knee, not elsewhere classified     Problem List Patient Active Problem List   Diagnosis Date Noted  . Obesity (BMI 30.0-34.9) 04/30/2017  . 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 Vaginal dryness, menopausal 08/21/2014  . Osteopenia 12/14/2013  . Essential hypertension 12/14/2013  . Complex regional pain syndrome of lower limb 10/04/2012  . Hyperlipidemia 10/04/2012  . Allergic rhinitis 10/04/2012  . Generalized osteoarthrosis, involving multiple sites 10/04/2012   Teena Irani, PTA/CLT [id number]  Teena Irani 10/19/2017, 12:01 PM  [place], [place], [address] Phone: [phone]   Fax:  [phone]  Name: [name] MRN: [id number] Date of Birth: 1945-08-02

## 2017-10-19 NOTE — Progress Notes (Addendum)
IB message sent to Dr. Veverly Fells for pre-op orders.  Pre-admission appointment is at 1300 today.  PCP  Dr. Zacarias Pont[facility]. Requested  OV and EKG.  Denies any cardiac history or testing. Has never been seen by a cardiologist.

## 2017-10-21 NOTE — H&P (Signed)
Patient's anticipated LOS is less than 2 midnights, meeting these requirements: - Younger than 73 - Lives within 1 hour of care - Has a competent adult at home to recover with post-op recover - NO history of  - Chronic pain requiring opiods  - Diabetes  - Coronary Artery Disease  - Heart failure  - Heart attack  - Stroke  - DVT/VTE  - Cardiac arrhythmia  - Respiratory Failure/COPD  - Renal failure  - Anemia  - Advanced Liver disease       [name] is an 72 y.o. female.    Chief Complaint: right knee pain  HPI: Pt is a 72 y.o. female complaining of right knee pain for multiple years. Pain had continually increased since the beginning. X-rays in the clinic show end-stage arthritic changes of the right knee. Pt has tried various conservative treatments which have failed to alleviate their symptoms, including injections and therapy. Various options are discussed with the patient. Risks, benefits and expectations were discussed with the patient. Patient understand the risks, benefits and expectations and wishes to proceed with surgery.   PCP:  Lauree Chandler, NP  D/C Plans: Home  PMH: Past Medical History:  Diagnosis Date  . Allergy   . Arthritis   . Contact dermatitis and other eczema, due to unspecified cause   . Depression   . Encounter for long-term (current) use of other medications   . Hyperlipidemia   . Other and unspecified hyperlipidemia   . Pain in joint, ankle and foot   . Screening for malignant neoplasm of the cervix     PSH: Past Surgical History:  Procedure Laterality Date  . ANKLE SURGERY  05/2006   Right Ankle  . Carpel TUnnel Release  [id number]   [id number]), Left(2006)  . CATARACT EXTRACTION Left 10/03/14  . CATARACT EXTRACTION Right 11/16/2014  . COLONOSCOPY    . DIAGNOSTIC LAPAROSCOPY  1980  . JOINT REPLACEMENT     Total Knee(L)-11/05/07  . KNEE ARTHROPLASTY  05/2007   Left  . RHINOPLASTY  1969  . TONSILLECTOMY     @ 72 y.o.    . TOTAL ANKLE ARTHROPLASTY  10/30/2011   Procedure: TOTAL ANKLE ARTHOPLASTY;  Surgeon: Wylene Simmer, MD;  Location: [facility];  Service: Orthopedics;  Laterality: Right;  RIGHT TOTAL ANKLE REPLACEMENT, POSSIBLE GASTROC RESESSION  . TUBAL LIGATION  1980    Social History:  reports that she quit smoking about 37 years ago. Her smoking use included cigarettes. She has a 8.50 pack-year smoking history. She has never used smokeless tobacco. She reports that she drinks alcohol. She reports that she does not use drugs.  Allergies:  Allergies  Allergen Reactions  . Morphine And Related Itching    Medications: Current Facility-Administered Medications  Medication Dose Route Frequency Provider Last Rate Last Dose  . 0.9 %  sodium chloride infusion  500 mL Intravenous Continuous Gatha Mayer, MD       Current Outpatient Medications  Medication Sig Dispense Refill  . acetaminophen (TYLENOL) 500 MG tablet Take 500-1,000 mg by mouth every 6 (six) hours as needed (for pain.).    Marland Kitchen buPROPion (WELLBUTRIN XL) 150 MG 24 hr tablet TAKE ONE TABLET BY MOUTH ONE TIME DAILY  (Patient taking differently: Take 150 mg by mouth daily. ) 90 tablet 0  . Calcium-Phosphorus-Vitamin D (CALCIUM/D3 ADULT GUMMIES PO) Take 2 tablets by mouth daily.    . celecoxib (CELEBREX) 200 MG capsule Take 200 mg by mouth daily.     Marland Kitchen  Cholecalciferol (VITAMIN D3) 2000 units TABS Take 2,000 Units by mouth daily.    . famotidine (PEPCID) 20 MG tablet Take 20 mg by mouth daily as needed for heartburn or indigestion.    Marland Kitchen levocetirizine (XYZAL) 5 MG tablet Take 5 mg by mouth daily.    . montelukast (SINGULAIR) 10 MG tablet Take 10 mg by mouth at bedtime.    . Multiple Vitamin (MULTIVITAMIN WITH MINERALS) TABS Take 1 tablet by mouth daily.    . OPCON-A 0.027-0.315 % SOLN Place 1 drop into both eyes 2 (two) times daily as needed (allergy eyes).      Results for orders placed or performed during the hospital encounter of 10/19/17 (from the  past 48 hour(s))  Surgical pcr screen     Status: None   Collection Time: 10/19/17  1:53 PM  Result Value Ref Range   MRSA, PCR NEGATIVE NEGATIVE   Staphylococcus aureus NEGATIVE NEGATIVE    Comment: (NOTE) The Xpert SA Assay (FDA approved for NASAL specimens in patients 65 years of age and older), is one component of a comprehensive surveillance program. It is not intended to diagnose infection nor to guide or monitor treatment. Performed at [facility], [place] [address] [place], [place] [address]   CBC     Status: Abnormal   Collection Time: 10/19/17  1:53 PM  Result Value Ref Range   WBC 9.6 4.0 - 10.5 K/uL   RBC 5.05 3.87 - 5.11 MIL/uL   Hemoglobin 14.7 12.0 - 15.0 g/dL   HCT 46.1 (H) 36.0 - 46.0 %   MCV 91.3 78.0 - 100.0 fL   MCH 29.1 26.0 - 34.0 pg   MCHC 31.9 30.0 - 36.0 g/dL   RDW 12.3 11.5 - 15.5 %   Platelets 231 150 - 400 K/uL    Comment: Performed at [facility] [address], [place], [place] [address]   No results found.  ROS: Pain with rom of the right lower extremity  Physical Exam: Alert and oriented 72 y.o. female in no acute distress Cranial nerves 2-12 intact Cervical spine: full rom with no tenderness, nv intact distally Chest: active breath sounds bilaterally, no wheeze rhonchi or rales Heart: regular rate and rhythm, no murmur Abd: non tender non distended with active bowel sounds Hip is stable with rom  Right knee pain with rom and joint line tenderness nv intact distally Slight antalgic gait No rashes or edema distally  Assessment/Plan Assessment: right knee end stage osteoarthritis  Plan:  Patient will undergo a right total knee by Dr. Veverly Fells at [facility]. Risks benefits and expectations were discussed with the patient. Patient understand risks, benefits and expectations and wishes to proceed. Preoperative templating of the joint replacement has been completed, documented, and submitted to the Operating Room personnel in  order to optimize intra-operative equipment management.   Merla Riches PA-C, MPAS [facility] is now [facility] [address] [place], [place], [place] [address] Phone: [phone] [email or web address] Facebook  [employer]

## 2017-10-22 ENCOUNTER — Other Ambulatory Visit: Payer: Self-pay | Admitting: Orthopedic Surgery

## 2017-10-22 ENCOUNTER — Ambulatory Visit (HOSPITAL_COMMUNITY): Payer: Medicare Other

## 2017-10-22 DIAGNOSIS — M25561 Pain in right knee: Secondary | ICD-10-CM | POA: Diagnosis not present

## 2017-10-22 DIAGNOSIS — R600 Localized edema: Secondary | ICD-10-CM | POA: Diagnosis not present

## 2017-10-22 DIAGNOSIS — M6281 Muscle weakness (generalized): Secondary | ICD-10-CM | POA: Diagnosis not present

## 2017-10-22 DIAGNOSIS — M25661 Stiffness of right knee, not elsewhere classified: Secondary | ICD-10-CM

## 2017-10-22 DIAGNOSIS — R262 Difficulty in walking, not elsewhere classified: Secondary | ICD-10-CM | POA: Diagnosis not present

## 2017-10-22 NOTE — Therapy (Signed)
[facility] [address] [place], [place], [address] Phone: [phone]   Fax:  [phone]  Physical Therapy Treatment/Discharge Summary  Patient Details  Name: [name] MRN: [id number] Date of Birth: 05/03/1945 Referring Provider: Netta Cedars, MD    Encounter Date: 10/22/2017   PHYSICAL THERAPY DISCHARGE SUMMARY  Visits from Start of Care: 16  Current functional level related to goals / functional outcomes: Re-assessment performed today and patient has further improved functional LE strength. She has made significant increase in strength based on MMT and continues to have AROM within norm for Rt knee. She is scheduled for her Rt TKA on 10/30/17 as she remains limited greatly by pain. Her follow-up evaluation is scheduled for 11/02/17 and she was provided with an HEP to perform following surgery. She will be discharged from this episode of care.   Remaining deficits: See below details.   Education / Equipment: Educated on current progress and that we will re-start PT on 11/02/17 following her Rt TKA. Provided HEP for immediatley after surgery.   Plan: Patient agrees to discharge.  Patient goals were met. Patient is being discharged due to meeting the stated rehab goals.  ?????       PT End of Session - 10/22/17 1015    Visit Number  16    Number of Visits  16    Date for PT Re-Evaluation  10/22/17   reassessed on 09/14/17   Authorization Type  [employer]; with secondary: 60 visits total for PT/OT/ST     Authorization Time Period  08/19/17 - 10/20/17    Authorization - Visit Number  16   3/10 since last progress note   Authorization - Number of Visits  60    PT Start Time  0947    PT Stop Time  1011    PT Time Calculation (min)  24 min    Equipment Utilized During Treatment  Gait belt    Activity Tolerance  Patient tolerated treatment well;No increased pain    Behavior During Therapy  WFL for tasks assessed/performed        Past Medical History:  Diagnosis Date  . Allergy   . Arthritis   . Contact dermatitis and other eczema, due to unspecified cause   . Depression   . Encounter for long-term (current) use of other medications   . Hyperlipidemia   . Other and unspecified hyperlipidemia   . Pain in joint, ankle and foot   . Screening for malignant neoplasm of the cervix     Past Surgical History:  Procedure Laterality Date  . ANKLE SURGERY  05/2006   Right Ankle  . Carpel TUnnel Release  [id number]   [id number]), Left(2006)  . CATARACT EXTRACTION Left 10/03/14  . CATARACT EXTRACTION Right 11/16/2014  . COLONOSCOPY    . DIAGNOSTIC LAPAROSCOPY  1980  . JOINT REPLACEMENT     Total Knee(L)-11/05/07  . KNEE ARTHROPLASTY  05/2007   Left  . RHINOPLASTY  1969  . TONSILLECTOMY     @ 72 y.o.  . TOTAL ANKLE ARTHROPLASTY  10/30/2011   Procedure: TOTAL ANKLE ARTHOPLASTY;  Surgeon: Wylene Simmer, MD;  Location: [facility];  Service: Orthopedics;  Laterality: Right;  RIGHT TOTAL ANKLE REPLACEMENT, POSSIBLE GASTROC RESESSION  . TUBAL LIGATION  1980    There were no vitals filed for this visit.    [facility] PT Assessment - 10/22/17 0001      Assessment   Medical Diagnosis  s/p Right knee  scope    Referring Provider  Netta Cedars, MD     Onset Date/Surgical Date  07/01/17    Hand Dominance  Left      Observation/Other Assessments   Focus on Therapeutic Outcomes (FOTO)   55% impaired   was 57% impaired     PROM   Right Knee Extension  0    Right Knee Flexion  132      Strength   Right Hip Flexion  4+/5    Right Hip Extension  4+/5    Right Hip ABduction  4+/5    Left Hip Flexion  5/5    Left Hip Extension  4+/5    Left Hip ABduction  5/5    Right Knee Flexion  4/5    Right Knee Extension  5/5    Left Knee Flexion  5/5    Left Knee Extension  5/5      Transfers   Five time sit to stand comments   10.4 seconds no UE support       [facility] Adult PT Treatment/Exercise - 10/22/17 0001      Exercises    Exercises  Knee/Hip      Knee/Hip Exercises: Supine   Straight Leg Raises  Strengthening;Right;1 set;10 reps      Knee/Hip Exercises: Sidelying   Hip ABduction  Strengthening;Right;1 set;10 reps    Hip ADduction  Strengthening;Right;1 set;10 reps      Knee/Hip Exercises: Prone   Hip Extension  Strengthening;Right;1 set;10 reps        PT Education - 10/22/17 1016    Education Details  Educated on current progress and that we will re-start PT on 11/02/17 following her Rt TKA. Provided HEP for immediatley after surgery.    Person(s) Educated  Patient    Methods  Explanation;Handout    Comprehension  Verbalized understanding;Returned demonstration       PT Short Term Goals - 10/22/17 1017      PT SHORT TERM GOAL #1   Title  ROM to improve 8-125 degrees or greater.     Baseline  7/15: 6-130 degrees    Status  Achieved        PT Long Term Goals - 10/22/17 1018      PT LONG TERM GOAL #1   Title  5/5 strength in Rt quads and hamstrings; 4/5 in hip abductors bilat.     Time  8    Period  Weeks    Status  Achieved      PT LONG TERM GOAL #2   Title  5xSTS hands free performed in under 12 seconds.     Time  8    Period  Weeks    Status  Achieved      PT LONG TERM GOAL #3   Title  Pt AMB freely in the community without falls anxiety.     Time  8    Period  Weeks    Status  Achieved      PT LONG TERM GOAL #4   Title  BBT score > 50/56 to demonstrate reduced falls risk.     Period  Days    Status  Achieved      PT LONG TERM GOAL #5   Title  Pt would like to be able to rise from the floor from a half/kneeing or lunge position.     Baseline  patient requires UE support on mat table/chair    Time  8    Period  Weeks    Status  Partially Met        Plan - 10/22/17 1016    Clinical Impression Statement  Re-assessment performed today and patient has further improved functional LE strength. She has made significant increase in strength based on MMT and continues to have  AROM within norm for Rt knee. She is scheduled for her Rt TKA on 10/30/17 as she remains limited greatly by pain. Her follow-up evaluation is scheduled for 11/02/17 and she was provided with an HEP to perform following surgery. She will be discharged from this episode of care.    Rehab Potential  Good    PT Frequency  2x / week    PT Duration  8 weeks    PT Treatment/Interventions  Gait training;Stair training;Moist Heat;Electrical Stimulation;Cryotherapy;Functional mobility training;Patient/family education;Manual techniques;Passive range of motion;Dry needling    PT Next Visit Plan  discharge this session    PT Home Exercise Plan  At eval: standing knee flexion, Supine SAQ, hooklying bridge, seated LAQ; 09/16/17 - prone hip extension, clamshell; 09/23/17 - reverse clamshell    Consulted and Agree with Plan of Care  Patient       Patient will benefit from skilled therapeutic intervention in order to improve the following deficits and impairments:  Abnormal gait, Decreased knowledge of use of DME, Pain, Decreased mobility, Postural dysfunction, Decreased activity tolerance, Decreased balance, Decreased range of motion, Decreased strength, Difficulty walking, Increased edema, Obesity  Visit Diagnosis: Acute pain of right knee  Stiffness of right knee, not elsewhere classified     Problem List Patient Active Problem List   Diagnosis Date Noted  . Obesity (BMI 30.0-34.9) 04/30/2017  . 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 Vaginal dryness, menopausal 08/21/2014  . Osteopenia 12/14/2013  . Essential hypertension 12/14/2013  . Complex regional pain syndrome of lower limb 10/04/2012  . Hyperlipidemia 10/04/2012  . Allergic rhinitis 10/04/2012  . Generalized osteoarthrosis, involving multiple sites 10/04/2012    Kipp Brood, PT, DPT Physical Therapist with [facility]  10/22/2017 10:20 AM    [facility] [address] [place], [place], [address] Phone: [phone]   Fax:  [phone]  Name: [name] MRN: [id number] Date of Birth: 06-13-1945

## 2017-10-22 NOTE — Care Plan (Signed)
R TKA scheduled on 10-30-17 DCP:   Home with spouse and sister.  1 story home with 1 ste.  Handicap accessible DME:  No needs.  Has a RW and 3-in-1. PT:  Forestine Na.  PT eval scheduled on 11-02-17 per patient.

## 2017-10-26 ENCOUNTER — Encounter (HOSPITAL_COMMUNITY): Payer: Medicare Other | Admitting: Physical Therapy

## 2017-10-26 ENCOUNTER — Other Ambulatory Visit: Payer: Medicare Other

## 2017-10-29 ENCOUNTER — Ambulatory Visit: Payer: Medicare Other | Admitting: Internal Medicine

## 2017-10-29 MED ORDER — TRANEXAMIC ACID 1000 MG/10ML IV SOLN
1000.0000 mg | INTRAVENOUS | Status: AC
  Administered 2017-10-30: 1000 mg via INTRAVENOUS
  Filled 2017-10-29: qty 1000

## 2017-10-30 ENCOUNTER — Inpatient Hospital Stay (HOSPITAL_COMMUNITY): Payer: Medicare Other | Admitting: Physician Assistant

## 2017-10-30 ENCOUNTER — Encounter (HOSPITAL_COMMUNITY): Payer: Self-pay | Admitting: *Deleted

## 2017-10-30 ENCOUNTER — Inpatient Hospital Stay (HOSPITAL_COMMUNITY)
Admission: RE | Admit: 2017-10-30 | Discharge: 2017-11-01 | DRG: 470 | Disposition: A | Discharge status: Home / Self Care | Payer: Medicare Other | Attending: Orthopedic Surgery | Admitting: Orthopedic Surgery

## 2017-10-30 ENCOUNTER — Inpatient Hospital Stay (HOSPITAL_COMMUNITY): Payer: Medicare Other | Admitting: Anesthesiology

## 2017-10-30 ENCOUNTER — Inpatient Hospital Stay (HOSPITAL_COMMUNITY): Payer: Medicare Other

## 2017-10-30 ENCOUNTER — Encounter (HOSPITAL_COMMUNITY)
Admission: RE | Disposition: A | Discharge status: Home / Self Care | Payer: Self-pay | Source: Home / Self Care | Attending: Orthopedic Surgery

## 2017-10-30 DIAGNOSIS — Z87891 Personal history of nicotine dependence: Secondary | ICD-10-CM

## 2017-10-30 DIAGNOSIS — I10 Essential (primary) hypertension: Secondary | ICD-10-CM | POA: Diagnosis not present

## 2017-10-30 DIAGNOSIS — M25761 Osteophyte, right knee: Secondary | ICD-10-CM | POA: Diagnosis present

## 2017-10-30 DIAGNOSIS — E785 Hyperlipidemia, unspecified: Secondary | ICD-10-CM | POA: Diagnosis present

## 2017-10-30 DIAGNOSIS — Z1211 Encounter for screening for malignant neoplasm of colon: Secondary | ICD-10-CM

## 2017-10-30 DIAGNOSIS — Z885 Allergy status to narcotic agent status: Secondary | ICD-10-CM | POA: Diagnosis not present

## 2017-10-30 DIAGNOSIS — Z79899 Other long term (current) drug therapy: Secondary | ICD-10-CM | POA: Diagnosis not present

## 2017-10-30 DIAGNOSIS — M1711 Unilateral primary osteoarthritis, right knee: Principal | ICD-10-CM | POA: Diagnosis present

## 2017-10-30 DIAGNOSIS — F329 Major depressive disorder, single episode, unspecified: Secondary | ICD-10-CM | POA: Diagnosis not present

## 2017-10-30 DIAGNOSIS — M25561 Pain in right knee: Secondary | ICD-10-CM | POA: Diagnosis present

## 2017-10-30 DIAGNOSIS — Z96651 Presence of right artificial knee joint: Secondary | ICD-10-CM

## 2017-10-30 DIAGNOSIS — G8918 Other acute postprocedural pain: Secondary | ICD-10-CM | POA: Diagnosis not present

## 2017-10-30 DIAGNOSIS — Z471 Aftercare following joint replacement surgery: Secondary | ICD-10-CM | POA: Diagnosis not present

## 2017-10-30 HISTORY — PX: TOTAL KNEE ARTHROPLASTY: SHX125

## 2017-10-30 SURGERY — ARTHROPLASTY, KNEE, TOTAL
Anesthesia: Regional | Site: Knee | Laterality: Right

## 2017-10-30 MED ORDER — FENTANYL CITRATE (PF) 100 MCG/2ML IJ SOLN
INTRAMUSCULAR | Status: AC
  Administered 2017-10-30: 50 ug via INTRAVENOUS
  Filled 2017-10-30: qty 2

## 2017-10-30 MED ORDER — LACTATED RINGERS IV SOLN
INTRAVENOUS | Status: DC
  Administered 2017-10-30: 08:00:00 via INTRAVENOUS

## 2017-10-30 MED ORDER — FENTANYL CITRATE (PF) 100 MCG/2ML IJ SOLN
INTRAMUSCULAR | Status: AC
  Filled 2017-10-30: qty 2

## 2017-10-30 MED ORDER — ONDANSETRON HCL 4 MG/2ML IJ SOLN
4.0000 mg | Freq: Four times a day (QID) | INTRAMUSCULAR | Status: DC | PRN
  Administered 2017-10-30 – 2017-10-31 (×3): 4 mg via INTRAVENOUS
  Filled 2017-10-30 (×3): qty 2

## 2017-10-30 MED ORDER — MIDAZOLAM HCL 2 MG/2ML IJ SOLN
1.0000 mg | Freq: Once | INTRAMUSCULAR | Status: AC
  Administered 2017-10-30: 1 mg via INTRAVENOUS

## 2017-10-30 MED ORDER — OXYCODONE-ACETAMINOPHEN 5-325 MG PO TABS: 1.0000 | ORAL_TABLET | ORAL | 0 refills | Status: AC | PRN

## 2017-10-30 MED ORDER — METHOCARBAMOL 500 MG PO TABS
ORAL_TABLET | ORAL | Status: AC
  Filled 2017-10-30: qty 1

## 2017-10-30 MED ORDER — LIDOCAINE 2% (20 MG/ML) 5 ML SYRINGE
INTRAMUSCULAR | Status: DC | PRN
  Administered 2017-10-30: 60 mg via INTRAVENOUS

## 2017-10-30 MED ORDER — CHLORHEXIDINE GLUCONATE 4 % EX LIQD: 60.0000 mL | Freq: Once | CUTANEOUS | Status: DC

## 2017-10-30 MED ORDER — PHENOL 1.4 % MT LIQD: 1.0000 | OROMUCOSAL | Status: DC | PRN

## 2017-10-30 MED ORDER — ONDANSETRON HCL 4 MG/2ML IJ SOLN
INTRAMUSCULAR | Status: DC | PRN
  Administered 2017-10-30: 4 mg via INTRAVENOUS

## 2017-10-30 MED ORDER — CEFAZOLIN SODIUM-DEXTROSE 2-4 GM/100ML-% IV SOLN
2.0000 g | Freq: Four times a day (QID) | INTRAVENOUS | Status: AC
  Administered 2017-10-30 (×2): 2 g via INTRAVENOUS
  Filled 2017-10-30 (×2): qty 100

## 2017-10-30 MED ORDER — LIDOCAINE 2% (20 MG/ML) 5 ML SYRINGE
INTRAMUSCULAR | Status: AC
  Filled 2017-10-30: qty 15

## 2017-10-30 MED ORDER — NAPHAZOLINE-PHENIRAMINE 0.027-0.315 % OP SOLN: 1.0000 [drp] | Freq: Two times a day (BID) | OPHTHALMIC | Status: DC | PRN

## 2017-10-30 MED ORDER — METOCLOPRAMIDE HCL 5 MG/ML IJ SOLN
5.0000 mg | Freq: Three times a day (TID) | INTRAMUSCULAR | Status: DC | PRN
  Administered 2017-10-30: 10 mg via INTRAVENOUS
  Filled 2017-10-30: qty 2

## 2017-10-30 MED ORDER — MONTELUKAST SODIUM 10 MG PO TABS
10.0000 mg | ORAL_TABLET | Freq: Every day | ORAL | Status: DC
  Administered 2017-10-30 – 2017-10-31 (×2): 10 mg via ORAL
  Filled 2017-10-30 (×2): qty 1

## 2017-10-30 MED ORDER — 0.9 % SODIUM CHLORIDE (POUR BTL) OPTIME
TOPICAL | Status: DC | PRN
  Administered 2017-10-30: 1000 mL

## 2017-10-30 MED ORDER — PROPOFOL 1000 MG/100ML IV EMUL
INTRAVENOUS | Status: AC
  Filled 2017-10-30: qty 100

## 2017-10-30 MED ORDER — DOCUSATE SODIUM 100 MG PO CAPS
100.0000 mg | ORAL_CAPSULE | Freq: Two times a day (BID) | ORAL | Status: DC
  Administered 2017-10-30 – 2017-11-01 (×4): 100 mg via ORAL
  Filled 2017-10-30 (×4): qty 1

## 2017-10-30 MED ORDER — METOCLOPRAMIDE HCL 5 MG PO TABS: 5.0000 mg | ORAL_TABLET | Freq: Three times a day (TID) | ORAL | Status: DC | PRN

## 2017-10-30 MED ORDER — CELECOXIB 200 MG PO CAPS
200.0000 mg | ORAL_CAPSULE | Freq: Every day | ORAL | Status: DC
  Administered 2017-10-31 – 2017-11-01 (×2): 200 mg via ORAL
  Filled 2017-10-30 (×2): qty 1

## 2017-10-30 MED ORDER — OXYCODONE HCL 5 MG PO TABS
ORAL_TABLET | ORAL | Status: AC
  Administered 2017-10-31: 5 mg via ORAL
  Filled 2017-10-30: qty 1

## 2017-10-30 MED ORDER — OXYCODONE HCL 5 MG PO TABS
5.0000 mg | ORAL_TABLET | Freq: Once | ORAL | Status: AC | PRN
  Administered 2017-10-30: 5 mg via ORAL

## 2017-10-30 MED ORDER — PROPOFOL 10 MG/ML IV BOLUS
INTRAVENOUS | Status: AC
  Filled 2017-10-30: qty 20

## 2017-10-30 MED ORDER — MENTHOL 3 MG MT LOZG: 1.0000 | LOZENGE | OROMUCOSAL | Status: DC | PRN

## 2017-10-30 MED ORDER — SODIUM CHLORIDE 0.9 % IV SOLN: 500.0000 mL | INTRAVENOUS | Status: DC

## 2017-10-30 MED ORDER — LEVOCETIRIZINE DIHYDROCHLORIDE 5 MG PO TABS: 5.0000 mg | ORAL_TABLET | Freq: Every day | ORAL | Status: DC

## 2017-10-30 MED ORDER — BISACODYL 10 MG RE SUPP: 10.0000 mg | Freq: Every day | RECTAL | Status: DC | PRN

## 2017-10-30 MED ORDER — ONDANSETRON HCL 4 MG/2ML IJ SOLN: 4.0000 mg | Freq: Once | INTRAMUSCULAR | Status: DC | PRN

## 2017-10-30 MED ORDER — ACETAMINOPHEN 500 MG PO TABS: 500.0000 mg | ORAL_TABLET | Freq: Four times a day (QID) | ORAL | Status: DC | PRN

## 2017-10-30 MED ORDER — ASPIRIN 81 MG PO CHEW: 81.0000 mg | CHEWABLE_TABLET | Freq: Two times a day (BID) | ORAL | 0 refills | Status: DC

## 2017-10-30 MED ORDER — ROPIVACAINE HCL 5 MG/ML IJ SOLN
INTRAMUSCULAR | Status: DC | PRN
  Administered 2017-10-30: 20 mL via EPIDURAL

## 2017-10-30 MED ORDER — PROPOFOL 10 MG/ML IV BOLUS
INTRAVENOUS | Status: DC | PRN
  Administered 2017-10-30: 130 mg via INTRAVENOUS

## 2017-10-30 MED ORDER — OXYCODONE HCL 5 MG PO TABS
5.0000 mg | ORAL_TABLET | ORAL | Status: DC | PRN
  Administered 2017-10-30: 10 mg via ORAL
  Administered 2017-10-30 – 2017-10-31 (×3): 5 mg via ORAL
  Administered 2017-10-31: 10 mg via ORAL
  Administered 2017-10-31 – 2017-11-01 (×5): 5 mg via ORAL
  Filled 2017-10-30: qty 1
  Filled 2017-10-30: qty 2
  Filled 2017-10-30 (×5): qty 1
  Filled 2017-10-30: qty 2
  Filled 2017-10-30 (×2): qty 1

## 2017-10-30 MED ORDER — METHOCARBAMOL 500 MG PO TABS
500.0000 mg | ORAL_TABLET | Freq: Four times a day (QID) | ORAL | Status: DC | PRN
  Administered 2017-10-30 – 2017-10-31 (×2): 500 mg via ORAL
  Filled 2017-10-30: qty 1

## 2017-10-30 MED ORDER — FENTANYL CITRATE (PF) 100 MCG/2ML IJ SOLN
50.0000 ug | Freq: Once | INTRAMUSCULAR | Status: AC
  Administered 2017-10-30: 50 ug via INTRAVENOUS

## 2017-10-30 MED ORDER — FENTANYL CITRATE (PF) 100 MCG/2ML IJ SOLN
INTRAMUSCULAR | Status: DC | PRN
  Administered 2017-10-30 (×2): 50 ug via INTRAVENOUS

## 2017-10-30 MED ORDER — OXYCODONE HCL 5 MG/5ML PO SOLN: 5.0000 mg | Freq: Once | ORAL | Status: AC | PRN

## 2017-10-30 MED ORDER — BUPROPION HCL ER (XL) 150 MG PO TB24
150.0000 mg | ORAL_TABLET | Freq: Every day | ORAL | Status: DC
  Administered 2017-10-31 – 2017-11-01 (×2): 150 mg via ORAL
  Filled 2017-10-30 (×2): qty 1

## 2017-10-30 MED ORDER — METHOCARBAMOL 500 MG PO TABS: 500.0000 mg | ORAL_TABLET | Freq: Three times a day (TID) | ORAL | 1 refills | Status: DC | PRN

## 2017-10-30 MED ORDER — LORATADINE 10 MG PO TABS
10.0000 mg | ORAL_TABLET | Freq: Every day | ORAL | Status: DC
  Administered 2017-10-31 – 2017-11-01 (×2): 10 mg via ORAL
  Filled 2017-10-30 (×2): qty 1

## 2017-10-30 MED ORDER — SODIUM CHLORIDE 0.9 % IR SOLN
Status: DC | PRN
  Administered 2017-10-30: 3000 mL

## 2017-10-30 MED ORDER — ACETAMINOPHEN 325 MG PO TABS: 325.0000 mg | ORAL_TABLET | Freq: Four times a day (QID) | ORAL | Status: DC | PRN

## 2017-10-30 MED ORDER — METHOCARBAMOL 1000 MG/10ML IJ SOLN
500.0000 mg | Freq: Four times a day (QID) | INTRAVENOUS | Status: DC | PRN
  Filled 2017-10-30: qty 5

## 2017-10-30 MED ORDER — POLYETHYLENE GLYCOL 3350 17 G PO PACK: 17.0000 g | PACK | Freq: Every day | ORAL | Status: DC | PRN

## 2017-10-30 MED ORDER — MIDAZOLAM HCL 2 MG/2ML IJ SOLN
INTRAMUSCULAR | Status: AC
  Administered 2017-10-30: 1 mg via INTRAVENOUS
  Filled 2017-10-30: qty 2

## 2017-10-30 MED ORDER — ARTIFICIAL TEARS OPHTHALMIC OINT
TOPICAL_OINTMENT | OPHTHALMIC | Status: AC
  Filled 2017-10-30: qty 3.5

## 2017-10-30 MED ORDER — FERROUS SULFATE 325 (65 FE) MG PO TABS
325.0000 mg | ORAL_TABLET | Freq: Three times a day (TID) | ORAL | Status: DC
  Administered 2017-10-31 – 2017-11-01 (×5): 325 mg via ORAL
  Filled 2017-10-30 (×6): qty 1

## 2017-10-30 MED ORDER — SODIUM CHLORIDE 0.9 % IV SOLN: INTRAVENOUS | Status: DC

## 2017-10-30 MED ORDER — CEFAZOLIN SODIUM-DEXTROSE 2-4 GM/100ML-% IV SOLN
2.0000 g | INTRAVENOUS | Status: AC
  Administered 2017-10-30: 2 g via INTRAVENOUS
  Filled 2017-10-30: qty 100

## 2017-10-30 MED ORDER — HYDROMORPHONE HCL 1 MG/ML IJ SOLN
0.5000 mg | INTRAMUSCULAR | Status: DC | PRN
  Administered 2017-10-30 – 2017-10-31 (×3): 1 mg via INTRAVENOUS
  Filled 2017-10-30 (×3): qty 1

## 2017-10-30 MED ORDER — ONDANSETRON HCL 4 MG/2ML IJ SOLN: 4.0000 mg | Freq: Four times a day (QID) | INTRAMUSCULAR | Status: DC | PRN

## 2017-10-30 MED ORDER — ADULT MULTIVITAMIN W/MINERALS CH
1.0000 | ORAL_TABLET | Freq: Every day | ORAL | Status: DC
  Administered 2017-10-31 – 2017-11-01 (×2): 1 via ORAL
  Filled 2017-10-30 (×2): qty 1

## 2017-10-30 MED ORDER — ONDANSETRON HCL 4 MG PO TABS: 4.0000 mg | ORAL_TABLET | Freq: Four times a day (QID) | ORAL | Status: DC | PRN

## 2017-10-30 MED ORDER — ASPIRIN 81 MG PO CHEW
81.0000 mg | CHEWABLE_TABLET | Freq: Two times a day (BID) | ORAL | Status: DC
  Administered 2017-10-30 – 2017-11-01 (×4): 81 mg via ORAL
  Filled 2017-10-30 (×4): qty 1

## 2017-10-30 MED ORDER — FAMOTIDINE 20 MG PO TABS: 20.0000 mg | ORAL_TABLET | Freq: Every day | ORAL | Status: DC | PRN

## 2017-10-30 MED ORDER — FENTANYL CITRATE (PF) 250 MCG/5ML IJ SOLN
INTRAMUSCULAR | Status: AC
  Filled 2017-10-30: qty 5

## 2017-10-30 MED ORDER — SODIUM CHLORIDE 0.9 % IV SOLN
INTRAVENOUS | Status: DC | PRN
  Administered 2017-10-30: 25 ug/min via INTRAVENOUS

## 2017-10-30 MED ORDER — TRANEXAMIC ACID 1000 MG/10ML IV SOLN
1000.0000 mg | Freq: Once | INTRAVENOUS | Status: AC
  Administered 2017-10-30: 1000 mg via INTRAVENOUS
  Filled 2017-10-30: qty 10

## 2017-10-30 MED ORDER — FENTANYL CITRATE (PF) 100 MCG/2ML IJ SOLN
25.0000 ug | INTRAMUSCULAR | Status: DC | PRN
  Administered 2017-10-30 (×3): 50 ug via INTRAVENOUS

## 2017-10-30 MED ORDER — VITAMIN D 1000 UNITS PO TABS
2000.0000 [IU] | ORAL_TABLET | Freq: Every day | ORAL | Status: DC
  Administered 2017-10-31 – 2017-11-01 (×2): 2000 [IU] via ORAL
  Filled 2017-10-30 (×3): qty 2

## 2017-10-30 SURGICAL SUPPLY — 66 items
BANDAGE ESMARK 6X9 LF (GAUZE/BANDAGES/DRESSINGS) ×1 IMPLANT
BLADE SAG 18X100X1.27 (BLADE) ×2 IMPLANT
BLADE SAW SGTL 13X75X1.27 (BLADE) ×2 IMPLANT
BNDG CMPR 9X6 STRL LF SNTH (GAUZE/BANDAGES/DRESSINGS) ×1
BNDG CMPR MED 10X6 ELC LF (GAUZE/BANDAGES/DRESSINGS) ×1
BNDG ELASTIC 6X10 VLCR STRL LF (GAUZE/BANDAGES/DRESSINGS) ×2 IMPLANT
BNDG ESMARK 6X9 LF (GAUZE/BANDAGES/DRESSINGS) ×2
BNDG GAUZE ELAST 4 BULKY (GAUZE/BANDAGES/DRESSINGS) ×4 IMPLANT
BOWL SMART MIX CTS (DISPOSABLE) ×2 IMPLANT
CEMENT HV SMART SET (Cement) ×4 IMPLANT
CEMENT TIBIA MBT (Knees) IMPLANT
CLSR STERI-STRIP ANTIMIC 1/2X4 (GAUZE/BANDAGES/DRESSINGS) ×2 IMPLANT
COVER SURGICAL LIGHT HANDLE (MISCELLANEOUS) ×2 IMPLANT
CUFF TOURNIQUET SINGLE 34IN LL (TOURNIQUET CUFF) ×1 IMPLANT
DRAPE EXTREMITY T 121X128X90 (DRAPE) ×2 IMPLANT
DRAPE HALF SHEET 40X57 (DRAPES) ×2 IMPLANT
DRAPE U-SHAPE 47X51 STRL (DRAPES) ×2 IMPLANT
DRSG ADAPTIC 3X8 NADH LF (GAUZE/BANDAGES/DRESSINGS) ×2 IMPLANT
DRSG PAD ABDOMINAL 8X10 ST (GAUZE/BANDAGES/DRESSINGS) ×4 IMPLANT
DURAPREP 26ML APPLICATOR (WOUND CARE) ×3 IMPLANT
ELECT CAUTERY BLADE 6.4 (BLADE) ×2 IMPLANT
ELECT REM PT RETURN 9FT ADLT (ELECTROSURGICAL) ×2
ELECTRODE REM PT RTRN 9FT ADLT (ELECTROSURGICAL) ×1 IMPLANT
FEMUR SIGMA PS SZ 3.0 R (Femur) ×1 IMPLANT
GAUZE SPONGE 4X4 12PLY STRL (GAUZE/BANDAGES/DRESSINGS) ×2 IMPLANT
GLOVE BIO SURGEON STRL SZ7.5 (GLOVE) ×1 IMPLANT
GLOVE BIOGEL PI IND STRL 7.5 (GLOVE) IMPLANT
GLOVE BIOGEL PI INDICATOR 7.5 (GLOVE) ×2
GLOVE BIOGEL PI ORTHO PRO 7.5 (GLOVE) ×1
GLOVE BIOGEL PI ORTHO PRO SZ8 (GLOVE) ×1
GLOVE ECLIPSE 7.0 STRL STRAW (GLOVE) ×3 IMPLANT
GLOVE ORTHO TXT STRL SZ7.5 (GLOVE) ×2 IMPLANT
GLOVE PI ORTHO PRO STRL 7.5 (GLOVE) ×1 IMPLANT
GLOVE PI ORTHO PRO STRL SZ8 (GLOVE) ×1 IMPLANT
GLOVE SURG ORTHO 8.5 STRL (GLOVE) ×2 IMPLANT
GOWN STRL REUS W/ TWL LRG LVL3 (GOWN DISPOSABLE) IMPLANT
GOWN STRL REUS W/ TWL XL LVL3 (GOWN DISPOSABLE) ×3 IMPLANT
GOWN STRL REUS W/TWL LRG LVL3 (GOWN DISPOSABLE) ×6
GOWN STRL REUS W/TWL XL LVL3 (GOWN DISPOSABLE) ×4
HANDPIECE INTERPULSE COAX TIP (DISPOSABLE) ×2
IMMOBILIZER KNEE 22 UNIV (SOFTGOODS) ×1 IMPLANT
KIT BASIN OR (CUSTOM PROCEDURE TRAY) ×2 IMPLANT
KIT MANIFOLD (MISCELLANEOUS) ×2 IMPLANT
KIT TURNOVER KIT B (KITS) ×2 IMPLANT
MANIFOLD NEPTUNE II (INSTRUMENTS) ×2 IMPLANT
NS IRRIG 1000ML POUR BTL (IV SOLUTION) ×2 IMPLANT
PACK TOTAL JOINT (CUSTOM PROCEDURE TRAY) ×2 IMPLANT
PAD ARMBOARD 7.5X6 YLW CONV (MISCELLANEOUS) ×4 IMPLANT
PATELLA DOME PFC 32MM (Knees) ×1 IMPLANT
PIN STEINMAN FIXATION KNEE (PIN) ×1 IMPLANT
PLATE ROT INSERT 12.5MM SIZE 3 (Plate) ×1 IMPLANT
SET HNDPC FAN SPRY TIP SCT (DISPOSABLE) ×1 IMPLANT
STRIP CLOSURE SKIN 1/2X4 (GAUZE/BANDAGES/DRESSINGS) ×4 IMPLANT
SUCTION FRAZIER HANDLE 10FR (MISCELLANEOUS) ×1
SUCTION TUBE FRAZIER 10FR DISP (MISCELLANEOUS) ×1 IMPLANT
SUT MNCRL AB 3-0 PS2 18 (SUTURE) ×2 IMPLANT
SUT VIC AB 0 CT1 27 (SUTURE) ×4
SUT VIC AB 0 CT1 27XBRD ANBCTR (SUTURE) ×2 IMPLANT
SUT VIC AB 1 CT1 27 (SUTURE) ×6
SUT VIC AB 1 CT1 27XBRD ANBCTR (SUTURE) ×3 IMPLANT
SUT VIC AB 2-0 CT1 27 (SUTURE) ×4
SUT VIC AB 2-0 CT1 TAPERPNT 27 (SUTURE) ×2 IMPLANT
TIBIA MBT CEMENT (Knees) ×2 IMPLANT
TOWEL OR 17X24 6PK STRL BLUE (TOWEL DISPOSABLE) ×2 IMPLANT
TOWEL OR 17X26 10 PK STRL BLUE (TOWEL DISPOSABLE) ×2 IMPLANT
TRAY FOLEY MTR SLVR 16FR STAT (SET/KITS/TRAYS/PACK) ×1 IMPLANT

## 2017-10-30 NOTE — Op Note (Signed)
NAME: [name], [name] MEDICAL RECORD LK:[id number] ACCOUNT [id number] DATE OF BIRTH:1945/08/19 FACILITY: [facility] LOCATION: [facility] PHYSICIAN:STEVEN Orlena Sheldon, MD  OPERATIVE REPORT  DATE OF PROCEDURE:  10/30/2017  PREOPERATIVE DIAGNOSIS:  Right knee end-stage osteoarthritis.  POSTOPERATIVE DIAGNOSIS:  Right knee end-stage osteoarthritis.  PROCEDURE PERFORMED:  Right total knee arthroplasty using DePuy Sigma rotating platform prosthesis.  ATTENDING SURGEON:  Doran Heater. Almando Brawley MD  ASSISTANT:  Darol Destine, PA-C, who was scrubbed during the entire procedure and necessary for satisfactory completion of surgery.  ANESTHESIA:  General anesthesia plus an attempted spinal anesthesia and adductor canal block was utilized.  ESTIMATED BLOOD LOSS:  Minimal.  FLUID REPLACEMENT:  1500 mL crystalloid.  INSTRUMENT COUNTS:  Correct.  COMPLICATIONS:  No complications.  Perioperative antibiotics were given.  TOURNIQUET TIME: 1  hour and 10 minutes at 300 mmHg.  INDICATIONS:  The patient is a 72 year old female with worsening right knee pain secondary to end-stage osteoarthritis.  The patient has pseudolaxity with some collapse into valgus as she walks.  The patient has had progressive pain despite conservative  management and has had a prior knee arthroscopy as well and has failed that and now is at a point where she is requiring total knee arthroplasty to relieve pain and restore function.  She has had a previous total knee on the left approximately 10 years  ago and has done well with that.  Risks and benefits of surgery discussed in detail with the patient.  Informed consent obtained.  DESCRIPTION OF PROCEDURE:  After an adequate level of anesthesia was achieved, the patient was positioned in the supine position.  She had initial attempt at a spinal block that did not seem to set up.  She has already had in preop an adductor canal  block.  At this point, decision was made to go  ahead and go to sleep with general anesthesia which was done without incident.  We then positioned the patient appropriately, placed a nonsterile tourniquet on the right proximal thigh.  Right leg sterilely  prepped and draped in the usual manner.  Time-out called, verifying correct patient, correct site.  We then elevated the leg and exsanguinated with an Esmarch bandage and elevated the tourniquet to 300 mmHg, placed the knee in flexion.  A longitudinal  midline incision was created and then a lateral or medial parapatellar arthrotomy was created utilizing a fresh 10 blade scalpel.  There was advanced arthritis in the knee with full cartilage loss and bone-on-bone.  We did do a division of the lateral  patellofemoral ligaments, eversion of the patella and flexion of the knee, exposing the entire knee joint.  We entered the distal femur with a step cut drill, placed intramedullary resection guide and resected 10 mm off the affected distal femur set on 5  degrees right.  We then performed our sizing and sized the femur to a size 3 anterior down.  Performed anterior, posterior and chamfer cuts with a 4-in-1 block.  We then removed ACL, PCL, and meniscal tissues.  Next subluxed the tibia anteriorly and  exposed the proximal tibia.  We were able to perform a perpendicular cut with minimal posterior slope using the external alignment jig and cutting block.  Once we had that cut made, we were pleased with that and then checked our gaps, which were  symmetric at 10 mm.  Removed excess posterior osteophytes off the posterior femur with a curved osteotome and mallet.  We then checked our gaps and they were symmetric  again and went at this point to size our tibia which is a size 3.  We then did our  modular drill and keel punched for final tibial preparation and impacted our trial tibia in place with max external rotation to improve our patellar tracking.  We then went to the femur and performed our box cut with the  oscillating saw and then placed  our 3 right femur in place and impacting that in place to give good coverage and sizing.  Then we placed first a size 3/10 poly trial and then went up going with a 12.5, which provided excellent stability both in flexion and extension.  We resurfaced the  patella using the patellar jig and cutting.  The initial thickness was approximately 23 mm thickness.  We took it down about 8 mm to 15 roughly and made our cut flush to the patella and we then went ahead and drilled our lug holes for the 32 patellar  button which we placed on there and had about the same width, maybe 1 mm more thickness.  After measuring the patellar tracking was perfect with no-touch technique.  We removed all trial components, pulse irrigated the bone, dried thoroughly and then  vacuum mixed cement on the back table.  We then placed the cement in position and then placed  all components into the proper positions.  Placed the knee in extension with a 12.5 poly.  We felt like that gave full extension plus excellent stability in  flexion.  So once the cement was hardened, a patellar clamp used to hold the patella while the cement hardened.  We removed excess cement with quarter inch curved osteotome and a hemostat.  We then went ahead and trialed the knee again.  We were happy  with the patellar tracking, our extension, flexion and stability.  We removed the trial of 12.5 poly insert and then selected the real 3/12/5 insert and placed that on the tibial tray and reduced the knee and had a nice little pop there medially.  So a  nice stable knee, well aligned and excellent tracking.  We irrigated thoroughly, closed the parapatellar arthrotomy with #1 Vicryl suture followed by 2-0 Vicryl for subcutaneous closure and 4-0 Monocryl for skin.  Steri-Strips applied followed by sterile  dressing.  The patient tolerated surgery well.  AN/NUANCE  D:10/30/2017 T:10/30/2017 JOB:[id number]

## 2017-10-30 NOTE — Anesthesia Procedure Notes (Addendum)
Procedure Name: LMA Insertion Date/Time: 10/30/2017 10:43 AM Performed by: Kyung Rudd, CRNA Pre-anesthesia Checklist: Patient identified, Emergency Drugs available, Suction available and Patient being monitored Patient Re-evaluated:Patient Re-evaluated prior to induction Oxygen Delivery Method: Circle System Utilized Preoxygenation: Pre-oxygenation with 100% oxygen Induction Type: IV induction Ventilation: Mask ventilation without difficulty LMA: LMA inserted LMA Size: 4.0 Number of attempts: 1 Airway Equipment and Method: Bite block Placement Confirmation: positive ETCO2 Tube secured with: Tape Dental Injury: Teeth and Oropharynx as per pre-operative assessment

## 2017-10-30 NOTE — Progress Notes (Signed)
Orthopedic Tech Progress Note Patient Details:  [name] Jun 04, 1945 [id number]  CPM Right Knee CPM Right Knee: On Right Knee Flexion (Degrees): 60 Right Knee Extension (Degrees): 0 Additional Comments: trapeze bar patient helper  Post Interventions Patient Tolerated: Well Instructions Provided: Care of device  [name] 10/30/2017, 1:30 PM

## 2017-10-30 NOTE — Progress Notes (Signed)
PT Cancellation Note  Patient Details Name: [name] MRN: [id number] DOB: [birth year]   Cancelled Treatment:    Reason Eval/Treat Not Completed: Pain limiting ability to participate Pt reporting increased pain and requesting to hold on PT until tomorrow. Will follow up as schedule allows.   Leighton Ruff, PT, DPT  Acute Rehabilitation Services  Pager: [phone] Office: [phone]  Rudean Hitt 10/30/2017, 4:43 PM

## 2017-10-30 NOTE — Brief Op Note (Signed)
10/30/2017  12:35 PM  PATIENT:  [name]  72 y.o. female  PRE-OPERATIVE DIAGNOSIS:  right knee osteoarthritis, end stage  POST-OPERATIVE DIAGNOSIS:  right knee osteoarthritis, end stage  PROCEDURE:  Procedure(s): RIGHT TOTAL KNEE ARTHROPLASTY (Right) DePuy Sigma RP  SURGEON:  Surgeon(s) and Role:    Netta Cedars, MD - Primary  PHYSICIAN ASSISTANT:   ASSISTANTS: Ventura Bruns, PA-C   ANESTHESIA:   regional and general  EBL:  50 mL   BLOOD ADMINISTERED:none  DRAINS: none   LOCAL MEDICATIONS USED:  NONE  SPECIMEN:  No Specimen  DISPOSITION OF SPECIMEN:  N/A  COUNTS:  YES  TOURNIQUET:   Total Tourniquet Time Documented: Thigh (Right) - 87 minutes Total: Thigh (Right) - 87 minutes   DICTATION: .Other Dictation: Dictation Number [id number]  PLAN OF CARE: Admit to inpatient   PATIENT DISPOSITION:  PACU - hemodynamically stable.   Delay start of Pharmacological VTE agent (>24hrs) due to surgical blood loss or risk of bleeding: no

## 2017-10-30 NOTE — Interval H&P Note (Signed)
History and Physical Interval Note:  10/30/2017 10:03 AM  Donald Prose  has presented today for surgery, with the diagnosis of right knee osteoarthritis  The various methods of treatment have been discussed with the patient and family. After consideration of risks, benefits and other options for treatment, the patient has consented to  Procedure(s): RIGHT TOTAL KNEE ARTHROPLASTY (Right) as a surgical intervention .  The patient's history has been reviewed, patient examined, no change in status, stable for surgery.  I have reviewed the patient's chart and labs.  Questions were answered to the patient's satisfaction.     Yarielis Funaro,STEVEN R

## 2017-10-30 NOTE — Anesthesia Preprocedure Evaluation (Addendum)
Anesthesia Evaluation  Patient identified by MRN, date of birth, ID band Patient awake    Reviewed: Allergy & Precautions, NPO status , Patient's Chart, lab work & pertinent test results  History of Anesthesia Complications Negative for: history of anesthetic complications  Airway Mallampati: III  TM Distance: >3 FB Neck ROM: Full    Dental no notable dental hx. (+) Teeth Intact, Dental Advisory Given   Pulmonary neg pulmonary ROS, former smoker,    Pulmonary exam normal        Cardiovascular negative cardio ROS Normal cardiovascular exam     Neuro/Psych PSYCHIATRIC DISORDERS Depression negative neurological ROS     GI/Hepatic Neg liver ROS, GERD  ,  Endo/Other  negative endocrine ROS  Renal/GU negative Renal ROS  negative genitourinary   Musculoskeletal  (+) Arthritis ,   Abdominal   Peds  Hematology negative hematology ROS (+)   Anesthesia Other Findings   Reproductive/Obstetrics                          Anesthesia Physical Anesthesia Plan  ASA: II  Anesthesia Plan: Spinal   Post-op Pain Management:  Regional for Post-op pain   Induction:   PONV Risk Score and Plan: 2 and Propofol infusion and Treatment may vary due to age or medical condition  Airway Management Planned: Natural Airway and Nasal Cannula  Additional Equipment:   Intra-op Plan:   Post-operative Plan:   Informed Consent: I have reviewed the patients History and Physical, chart, labs and discussed the procedure including the risks, benefits and alternatives for the proposed anesthesia with the patient or authorized representative who has indicated his/her understanding and acceptance.     Plan Discussed with:   Anesthesia Plan Comments:        Anesthesia Quick Evaluation

## 2017-10-30 NOTE — Progress Notes (Addendum)
1545 Received pt from PACU, A&O x4. RLE with ace wrap dry and intact. Bone foam on.  Medicated for pain.  RLE with full sensation but numbness and weakness was noted to left leg.  1800 Left lower leg full sensation is back, no weakness, denies numbness.

## 2017-10-30 NOTE — Transfer of Care (Signed)
Immediate Anesthesia Transfer of Care Note  Patient: [name]  Procedure(s) Performed: RIGHT TOTAL KNEE ARTHROPLASTY (Right Knee)  Patient Location: PACU  Anesthesia Type:GA combined with regional for post-op pain  Level of Consciousness: awake, alert  and oriented  Airway & Oxygen Therapy: Patient Spontanous Breathing and Patient connected to nasal cannula oxygen  Post-op Assessment: Report given to RN and Post -op Vital signs reviewed and stable  Post vital signs: Reviewed and stable  Last Vitals:  Vitals Value Taken Time  BP 144/78 10/30/2017 12:37 PM  Temp    Pulse 46 10/30/2017 12:39 PM  Resp 12 10/30/2017 12:39 PM  SpO2 99 % 10/30/2017 12:39 PM  Vitals shown include unvalidated device data.  Last Pain:  Vitals:   10/30/17 0841  TempSrc:   PainSc: 0-No pain         Complications: No apparent anesthesia complications

## 2017-10-30 NOTE — Discharge Instructions (Signed)
Ice to the knee as much as you can.  Ok to put your full weight on the right knee  Keep the incision clean and dry and covered for one week, then ok to get it wet in the shower.  Do exercises every hour as instructed.  Use the knee immobilizer at night to maintain knee extension.  Follow up in the office in two weeks. Call [phone] for appt.  Make sure to take your Baby Aspirin twice daily for one month and wear the knee high support hose 24/7 for one month to prevent blood clots

## 2017-10-30 NOTE — Anesthesia Procedure Notes (Addendum)
Anesthesia Regional Block: Adductor canal block   Pre-Anesthetic Checklist: ,, timeout performed, Correct Patient, Correct Site, Correct Laterality, Correct Procedure, Correct Position, site marked, Risks and benefits discussed,  Surgical consent,  Pre-op evaluation,  At surgeon's request and post-op pain management  Laterality: Right  Prep: chloraprep       Needles:  Injection technique: Single-shot  Needle Type: Echogenic Stimulator Needle     Needle Length: 9cm  Needle Gauge: 21     Additional Needles:   Procedures:,,,, ultrasound used (permanent image in chart),,,,  Narrative:  Start time: 10/30/2017 9:30 AM End time: 10/30/2017 9:36 AM Injection made incrementally with aspirations every 5 mL.  Performed by: Personally  Anesthesiologist: Lidia Collum, MD

## 2017-10-31 LAB — BASIC METABOLIC PANEL
Anion gap: 10 (ref 5–15)
BUN: 8 mg/dL (ref 8–23)
CO2: 27 mmol/L (ref 22–32)
Calcium: 8.2 mg/dL — ABNORMAL LOW (ref 8.9–10.3)
Chloride: 91 mmol/L — ABNORMAL LOW (ref 98–111)
Creatinine, Ser: 0.62 mg/dL (ref 0.44–1.00)
Glucose, Bld: 149 mg/dL — ABNORMAL HIGH (ref 70–99)
Potassium: 3.9 mmol/L (ref 3.5–5.1)
SODIUM: 128 mmol/L — AB (ref 135–145)

## 2017-10-31 LAB — CBC
HCT: 42.5 % (ref 36.0–46.0)
HEMOGLOBIN: 13.8 g/dL (ref 12.0–15.0)
MCH: 28.9 pg (ref 26.0–34.0)
MCHC: 32.5 g/dL (ref 30.0–36.0)
MCV: 88.9 fL (ref 78.0–100.0)
PLATELETS: 210 10*3/uL (ref 150–400)
RBC: 4.78 MIL/uL (ref 3.87–5.11)
RDW: 11.9 % (ref 11.5–15.5)
WBC: 12.5 10*3/uL — AB (ref 4.0–10.5)

## 2017-10-31 NOTE — Progress Notes (Signed)
   Subjective:  Patient reports pain as moderate.  She did have some increased pain overnight as her block did not "take."  She denies shortness of breath, nausea, or vomiting.  She has had some elevated blood pressures overnight secondary to pain.  Objective:   VITALS:   Vitals:   10/30/17 1840 10/30/17 1942 10/31/17 0020 10/31/17 0439  BP: (!) 165/96 (!) 168/92 (!) 172/90 (!) 170/90  Pulse: 90 91 (!) 101 98  Resp: (!) 22     Temp: 98.3 F (36.8 C) 97.9 F (36.6 C) 97.6 F (36.4 C) 98 F (36.7 C)  TempSrc: Oral Oral Oral Oral  SpO2: 92% 92% 97% 94%  Weight:      Height:        Neurovascular intact Sensation intact distally Intact pulses distally Dorsiflexion/Plantar flexion intact Incision: dressing C/D/I Compartment soft  Lab Results  Component Value Date   WBC 12.5 (H) 10/31/2017   HGB 13.8 10/31/2017   HCT 42.5 10/31/2017   MCV 88.9 10/31/2017   PLT 210 10/31/2017   BMET    Component Value Date/Time   NA 128 (L) 10/31/2017 0435   NA 141 08/17/2014 0906   K 3.9 10/31/2017 0435   CL 91 (L) 10/31/2017 0435   CO2 27 10/31/2017 0435   GLUCOSE 149 (H) 10/31/2017 0435   BUN 8 10/31/2017 0435   BUN 16 08/17/2014 0906   CREATININE 0.62 10/31/2017 0435   CREATININE 0.93 09/18/2016 0820   CALCIUM 8.2 (L) 10/31/2017 0435   GFRNONAA >60 10/31/2017 0435   GFRNONAA 61 09/18/2016 0820   GFRAA >60 10/31/2017 0435   GFRAA 71 09/18/2016 0820     Assessment/Plan: 1 Day Post-Op   Active Problems:   Status post total knee replacement, right   Advance diet Up with therapy -Okay for weightbearing as tolerated with therapy. -Continue postoperative pain medication and postoperative care. -We will monitor blood pressure, likely related to pain and should normalize. -Continue inpatient for now.  Likely discharge home with outpatient therapy.   Nicholes Stairs 10/31/2017, 9:20 AM   Geralynn Rile, MD [phone]

## 2017-10-31 NOTE — Progress Notes (Signed)
Physical Therapy Treatment Patient Details Name: [name] MRN: [id number] DOB: [birth year] Today's Date: 10/31/2017    History of Present Illness Pt is a 72 y/o female who presents s/p R TKR on 10/30/17. PMH significant for ankle replacement 2013, L TKR 2009, bilateral carpal tunnel release, bilateral cataract extraction, depression.     PT Comments    Pt progressing slowly towards physical therapy goals. Continues to site pain as main limiting factor, however pt appears very anxious about mobility as well. Pt somewhat self-limiting, and feel she will require 2 PT sessions prior to planned d/c home tomorrow. Will continue to follow.   Follow Up Recommendations  Follow surgeon's recommendation for DC plan and follow-up therapies     Equipment Recommendations  None recommended by PT    Recommendations for Other Services       Precautions / Restrictions Precautions Precautions: Fall;Knee Precaution Comments: Pt was educated on NO pillow/roll/ice pack under knee, only towel roll or bone foam under heel/ankle Required Braces or Orthoses: Knee Immobilizer - Right Knee Immobilizer - Right: Other (comment)(In bed unless in CPM) Restrictions Weight Bearing Restrictions: Yes RLE Weight Bearing: Weight bearing as tolerated    Mobility  Bed Mobility Overal bed mobility: Needs Assistance Bed Mobility: Supine to Sit     Supine to sit: Min guard     General bed mobility comments: Pt was able to elevate RLE up into bed without assist. Appeared very effortful however no assistance required.   Transfers Overall transfer level: Needs assistance Equipment used: Rolling walker (2 wheeled) Transfers: Sit to/from Stand Sit to Stand: Min guard         General transfer comment: Hands-on guarding for safety as pt powered up to full standing position. Pt demonstrated proper hand placement on seated surface for safety.   Ambulation/Gait Ambulation/Gait assistance: Min guard Gait  Distance (Feet): 40 Feet Assistive device: Rolling walker (2 wheeled) Gait Pattern/deviations: Step-to pattern;Decreased stride length;Decreased dorsiflexion - right;Decreased weight shift to right;Trunk flexed Gait velocity: Decreased Gait velocity interpretation: <1.31 ft/sec, indicative of household ambulator General Gait Details: Max encouragement provided for pt to complete this distance, and several standing rest breaks required throughout.    Stairs             Wheelchair Mobility    Modified Rankin (Stroke Patients Only)       Balance Overall balance assessment: Needs assistance Sitting-balance support: Feet supported;Single extremity supported Sitting balance-Leahy Scale: Fair Sitting balance - Comments: Guarded due to pain   Standing balance support: Bilateral upper extremity supported;During functional activity Standing balance-Leahy Scale: Poor Standing balance comment: Reliant on RW for balance support                            Cognition Arousal/Alertness: Awake/alert Behavior During Therapy: Anxious Overall Cognitive Status: Within Functional Limits for tasks assessed                                        Exercises Total Joint Exercises Ankle Circles/Pumps: 10 reps Quad Sets: 10 reps Short Arc Quad: 10 reps Heel Slides: 10 reps    General Comments        Pertinent Vitals/Pain Pain Assessment: Faces Faces Pain Scale: Hurts whole lot Pain Location: R knee Pain Descriptors / Indicators: Operative site guarding;Aching Pain Intervention(s): Limited activity within patient's tolerance;Monitored during session;Repositioned  Home Living                      Prior Function            PT Goals (current goals can now be found in the care plan section) Acute Rehab PT Goals Patient Stated Goal: Home tomorrow PT Goal Formulation: With patient Time For Goal Achievement: 11/07/17 Potential to Achieve Goals:  Good Progress towards PT goals: Progressing toward goals    Frequency    7X/week      PT Plan Current plan remains appropriate    Co-evaluation              AM-PAC PT "6 Clicks" Daily Activity  Outcome Measure  Difficulty turning over in bed (including adjusting bedclothes, sheets and blankets)?: Unable Difficulty moving from lying on back to sitting on the side of the bed? : Unable Difficulty sitting down on and standing up from a chair with arms (e.g., wheelchair, bedside commode, etc,.)?: Unable Help needed moving to and from a bed to chair (including a wheelchair)?: A Little Help needed walking in hospital room?: A Little Help needed climbing 3-5 steps with a railing? : Total 6 Click Score: 10    End of Session Equipment Utilized During Treatment: Gait belt Activity Tolerance: Patient limited by pain Patient left: in chair;with call bell/phone within reach;with family/visitor present Nurse Communication: Mobility status PT Visit Diagnosis: Unsteadiness on feet (R26.81);Muscle weakness (generalized) (M62.81);Pain Pain - Right/Left: Right Pain - part of body: Knee     Time: 3300-7622 PT Time Calculation (min) (ACUTE ONLY): 22 min  Charges:  $Gait Training: 8-22 mins                     [name], PT, DPT Acute Rehabilitation Services Pager: [phone] Office: [phone]    [name] 10/31/2017, 3:07 PM

## 2017-10-31 NOTE — Evaluation (Signed)
Physical Therapy Evaluation Patient Details Name: [name] MRN: [id number] DOB: 1945/03/07 Today's Date: 10/31/2017   History of Present Illness  Pt is a 72 y/o female who presents s/p R TKR on 10/30/17. PMH significant for ankle replacement 2013, L TKR 2009, bilateral carpal tunnel release, bilateral cataract extraction, depression.   Clinical Impression  Pt is s/p TKA resulting in the deficits listed below (see PT Problem List). At the time of PT eval pt very stiff and sore - states she has not moved her RLE all night. Pain and nausea limiting session however pt was able to ambulate ~15 feet with RW and min guard assist. Pt anticipates d/c home tomorrow - will see again this afternoon for mobility progression. Acutely, pt will benefit from skilled PT to increase their independence and safety with mobility to allow discharge to the venue listed below.      Follow Up Recommendations Follow surgeon's recommendation for DC plan and follow-up therapies    Equipment Recommendations  None recommended by PT    Recommendations for Other Services       Precautions / Restrictions Precautions Precautions: Fall;Knee Precaution Comments: Pt was educated on NO pillow/roll/ice pack under knee, only towel roll or bone foam under heel/ankle Required Braces or Orthoses: Knee Immobilizer - Right Knee Immobilizer - Right: Other (comment)(In bed unless in CPM) Restrictions Weight Bearing Restrictions: Yes RLE Weight Bearing: Weight bearing as tolerated      Mobility  Bed Mobility Overal bed mobility: Needs Assistance Bed Mobility: Supine to Sit     Supine to sit: Min assist     General bed mobility comments: Assist for RLE advancement towards EOB. Increased time for pt to scoot out enough to get feet resting on floor  Transfers Overall transfer level: Needs assistance Equipment used: Rolling walker (2 wheeled) Transfers: Sit to/from Stand Sit to Stand: Min assist          General transfer comment: Assist to power up to full standing position. Pt was cued for hand placement on seated surface for safety.   Ambulation/Gait Ambulation/Gait assistance: Min guard Gait Distance (Feet): 15 Feet Assistive device: Rolling walker (2 wheeled) Gait Pattern/deviations: Step-to pattern;Decreased stride length;Decreased dorsiflexion - right;Decreased weight shift to right;Trunk flexed Gait velocity: Decreased Gait velocity interpretation: <1.8 ft/sec, indicate of risk for recurrent falls General Gait Details: Pt with very short steps and noted keeping heel up on RLE when advancing. Not able to attain heel strike this session.   Stairs            Wheelchair Mobility    Modified Rankin (Stroke Patients Only)       Balance Overall balance assessment: Needs assistance Sitting-balance support: Feet supported;Single extremity supported Sitting balance-Leahy Scale: Fair Sitting balance - Comments: Guarded due to pain   Standing balance support: Bilateral upper extremity supported;During functional activity Standing balance-Leahy Scale: Poor Standing balance comment: Reliant on RW for balance support                             Pertinent Vitals/Pain Pain Assessment: 0-10 Pain Score: 3  Pain Location: R knee at rest. When ambulating faces pain scale ~8 Pain Descriptors / Indicators: Operative site guarding;Aching Pain Intervention(s): Limited activity within patient's tolerance;Monitored during session;Repositioned    Home Living Family/patient expects to be discharged to:: Private residence Living Arrangements: Spouse/significant other Available Help at Discharge: Family;Available 24 hours/day Type of Home: House Home Access: Level entry  Home Layout: Two level;Able to live on main level with bedroom/bathroom Home Equipment: Shower seat;Walker - 2 wheels      Prior Function Level of Independence: Independent               Hand  Dominance        Extremity/Trunk Assessment   Upper Extremity Assessment Upper Extremity Assessment: Overall WFL for tasks assessed    Lower Extremity Assessment Lower Extremity Assessment: RLE deficits/detail RLE Deficits / Details: Decreased strength and AROM consistent with above mentioned procedure RLE Sensation: WNL RLE Coordination: decreased gross motor    Cervical / Trunk Assessment Cervical / Trunk Assessment: Other exceptions Cervical / Trunk Exceptions: Forward head posture with rounded shoulders in standing  Communication   Communication: No difficulties  Cognition Arousal/Alertness: Awake/alert Behavior During Therapy: Anxious Overall Cognitive Status: Within Functional Limits for tasks assessed                                        General Comments      Exercises Total Joint Exercises Ankle Circles/Pumps: 10 reps Quad Sets: 10 reps Heel Slides: 10 reps Straight Leg Raises: 5 reps;AAROM Long Arc Quad: 10 reps;Limitations Long Arc Quad Limitations: Short range Goniometric ROM: <45 flexion in sitting. Not formally measured due to pain   Assessment/Plan    PT Assessment Patient needs continued PT services  PT Problem List Decreased strength;Decreased range of motion;Decreased activity tolerance;Decreased balance;Decreased mobility;Decreased knowledge of use of DME;Decreased safety awareness;Decreased knowledge of precautions;Pain       PT Treatment Interventions DME instruction;Gait training;Stair training;Functional mobility training;Therapeutic activities;Therapeutic exercise;Neuromuscular re-education;Patient/family education    PT Goals (Current goals can be found in the Care Plan section)  Acute Rehab PT Goals Patient Stated Goal: Home tomorrow PT Goal Formulation: With patient Time For Goal Achievement: 11/07/17 Potential to Achieve Goals: Good    Frequency 7X/week   Barriers to discharge        Co-evaluation                AM-PAC PT "6 Clicks" Daily Activity  Outcome Measure Difficulty turning over in bed (including adjusting bedclothes, sheets and blankets)?: Unable Difficulty moving from lying on back to sitting on the side of the bed? : Unable Difficulty sitting down on and standing up from a chair with arms (e.g., wheelchair, bedside commode, etc,.)?: Unable Help needed moving to and from a bed to chair (including a wheelchair)?: A Little Help needed walking in hospital room?: A Little Help needed climbing 3-5 steps with a railing? : Total 6 Click Score: 10    End of Session Equipment Utilized During Treatment: Gait belt Activity Tolerance: Patient limited by pain Patient left: in chair;with call bell/phone within reach;with family/visitor present Nurse Communication: Mobility status PT Visit Diagnosis: Unsteadiness on feet (R26.81);Muscle weakness (generalized) (M62.81);Pain Pain - Right/Left: Right Pain - part of body: Knee    Time: 0725-0757 PT Time Calculation (min) (ACUTE ONLY): 32 min   Charges:   PT Evaluation $PT Eval Moderate Complexity: 1 Mod PT Treatments $Gait Training: 8-22 mins        Rolinda Roan, PT, DPT Acute Rehabilitation Services Pager: [phone] Office: [phone]   Thelma Comp 10/31/2017, 8:17 AM

## 2017-10-31 NOTE — Plan of Care (Signed)

## 2017-10-31 NOTE — Plan of Care (Signed)
  Problem: Pain Managment: Goal: General experience of comfort will improve Outcome: Progressing   Problem: Safety: Goal: Ability to remain free from injury will improve Outcome: Progressing   Problem: Activity: Goal: Risk for activity intolerance will decrease Outcome: Progressing   Problem: Education: Goal: Knowledge of General Education information will improve Description Including pain rating scale, medication(s)/side effects and non-pharmacologic comfort measures Outcome: Progressing   Problem: Activity: Goal: Risk for activity intolerance will decrease Outcome: Progressing   Problem: Pain Managment: Goal: General experience of comfort will improve Outcome: Progressing   Problem: Safety: Goal: Ability to remain free from injury will improve Outcome: Progressing   Problem: Skin Integrity: Goal: Risk for impaired skin integrity will decrease Outcome: Progressing

## 2017-11-01 LAB — CBC
HEMATOCRIT: 39.4 % (ref 36.0–46.0)
Hemoglobin: 13.1 g/dL (ref 12.0–15.0)
MCH: 29.2 pg (ref 26.0–34.0)
MCHC: 33.2 g/dL (ref 30.0–36.0)
MCV: 87.8 fL (ref 78.0–100.0)
PLATELETS: 205 10*3/uL (ref 150–400)
RBC: 4.49 MIL/uL (ref 3.87–5.11)
RDW: 12.1 % (ref 11.5–15.5)
WBC: 12.3 10*3/uL — AB (ref 4.0–10.5)

## 2017-11-01 NOTE — Progress Notes (Signed)
Physical Therapy Treatment Patient Details Name: TENNILLE Trujillo MRN: [id number] DOB: May 18, 1945 Today's Date: 11/01/2017    History of Present Illness Pt is a 72 y/o female who presents s/p R TKR on 10/30/17. PMH significant for ankle replacement 2013, L TKR 2009, bilateral carpal tunnel release, bilateral cataract extraction, depression.     PT Comments    Pt has progressed well with mobility. All education complete. Pt to d/c home today. She will begin OPPT tomorrow, 08-02-17.   Follow Up Recommendations  Follow surgeon's recommendation for DC plan and follow-up therapies     Equipment Recommendations  None recommended by PT    Recommendations for Other Services       Precautions / Restrictions Precautions Precautions: Fall;Knee Precaution Comments: reviewed no pillow under knee Required Braces or Orthoses: Knee Immobilizer - Right Knee Immobilizer - Right: Other (comment)(in bed) Restrictions Weight Bearing Restrictions: Yes RLE Weight Bearing: Weight bearing as tolerated    Mobility  Bed Mobility Overal bed mobility: Needs Assistance Bed Mobility: Supine to Sit;Sit to Supine     Supine to sit: Modified independent (Device/Increase time) Sit to supine: Modified independent (Device/Increase time)   General bed mobility comments: +rail, increased time and effort  Transfers Overall transfer level: Needs assistance Equipment used: Rolling walker (2 wheeled) Transfers: Sit to/from Stand Sit to Stand: Supervision         General transfer comment: supervision for safety, cues for hand placement  Ambulation/Gait Ambulation/Gait assistance: Supervision Gait Distance (Feet): 100 Feet Assistive device: Rolling walker (2 wheeled) Gait Pattern/deviations: Step-to pattern;Decreased stride length;Decreased weight shift to right;Antalgic Gait velocity: Decreased Gait velocity interpretation: 1.31 - 2.62 ft/sec, indicative of limited community ambulator General Gait  Details: cues for heel strike and toe off RLE. Pt tends to hip hike to protect knee.   Stairs Stairs: Yes Stairs assistance: Min assist Stair Management: No rails;Forwards;With walker Number of Stairs: 1 General stair comments: cues for sequencing   Wheelchair Mobility    Modified Rankin (Stroke Patients Only)       Balance Overall balance assessment: Needs assistance Sitting-balance support: Feet supported;No upper extremity supported Sitting balance-Leahy Scale: Good     Standing balance support: Bilateral upper extremity supported;During functional activity Standing balance-Leahy Scale: Poor Standing balance comment: able to stand statically without UE support                            Cognition Arousal/Alertness: Awake/alert Behavior During Therapy: WFL for tasks assessed/performed Overall Cognitive Status: Within Functional Limits for tasks assessed                                        Exercises Total Joint Exercises Ankle Circles/Pumps: AROM;Both;20 reps Quad Sets: AROM;Right;10 reps Short Arc Quad: AROM;Right;10 reps Heel Slides: 10 reps;Right;AROM Hip ABduction/ADduction: AROM;Right;10 reps Straight Leg Raises: AAROM;Right;10 reps Goniometric ROM: 0-65 R knee    General Comments        Pertinent Vitals/Pain Pain Assessment: 0-10 Pain Score: 3  Pain Location: R knee Pain Descriptors / Indicators: Sore Pain Intervention(s): Monitored during session    Home Living                      Prior Function            PT Goals (current goals can now be found in the care  plan section) Acute Rehab PT Goals Patient Stated Goal: home today PT Goal Formulation: With patient Time For Goal Achievement: 11/07/17 Potential to Achieve Goals: Good Progress towards PT goals: Progressing toward goals    Frequency    7X/week      PT Plan Current plan remains appropriate    Co-evaluation              AM-PAC  PT "6 Clicks" Daily Activity  Outcome Measure  Difficulty turning over in bed (including adjusting bedclothes, sheets and blankets)?: A Little Difficulty moving from lying on back to sitting on the side of the bed? : A Little Difficulty sitting down on and standing up from a chair with arms (e.g., wheelchair, bedside commode, etc,.)?: A Little Help needed moving to and from a bed to chair (including a wheelchair)?: None Help needed walking in hospital room?: A Little Help needed climbing 3-5 steps with a railing? : A Little 6 Click Score: 19    End of Session Equipment Utilized During Treatment: Gait belt Activity Tolerance: Patient tolerated treatment well Patient left: in bed;with call bell/phone within reach Nurse Communication: Mobility status PT Visit Diagnosis: Unsteadiness on feet (R26.81);Muscle weakness (generalized) (M62.81);Pain Pain - Right/Left: Right Pain - part of body: Knee     Time: 7902-4097 PT Time Calculation (min) (ACUTE ONLY): 13 min  Charges:  $Gait Training: 8-22 mins $Therapeutic Exercise: 8-22 mins                     Lorrin Goodell, PT  Office # [phone] Pager [phone]    [name] 11/01/2017, 12:16 PM

## 2017-11-01 NOTE — Progress Notes (Signed)
Orthopedics Progress Note  Subjective: I am feeling better today  Objective:  Vitals:   10/31/17 1938 11/01/17 0443  BP: (!) 142/73 140/70  Pulse: 98 93  Resp:    Temp: 98.6 F (37 C) 99.2 F (37.3 C)  SpO2: 94% 94%    General: Awake and alert  Musculoskeletal: Right knee dressing changed, wound looks good. Compartments supple with no cords, Neg Homans Neurovascularly intact  Lab Results  Component Value Date   WBC 12.3 (H) 11/01/2017   HGB 13.1 11/01/2017   HCT 39.4 11/01/2017   MCV 87.8 11/01/2017   PLT 205 11/01/2017       Component Value Date/Time   NA 128 (L) 10/31/2017 0435   NA 141 08/17/2014 0906   K 3.9 10/31/2017 0435   CL 91 (L) 10/31/2017 0435   CO2 27 10/31/2017 0435   GLUCOSE 149 (H) 10/31/2017 0435   BUN 8 10/31/2017 0435   BUN 16 08/17/2014 0906   CREATININE 0.62 10/31/2017 0435   CREATININE 0.93 09/18/2016 0820   CALCIUM 8.2 (L) 10/31/2017 0435   GFRNONAA >60 10/31/2017 0435   GFRNONAA 61 09/18/2016 0820   GFRAA >60 10/31/2017 0435   GFRAA 71 09/18/2016 0820    Lab Results  Component Value Date   INR 1.8 (H) 11/08/2007   INR 1.7 (H) 11/07/2007   INR 1.1 11/06/2007    Assessment/Plan: POD #2 s/p Procedure(s): RIGHT TOTAL KNEE ARTHROPLASTY Discharge to home after therapy DVT prophylaxis Outpatient therapy tomorrow in [place] Follow up in two weeks in the office  Remo Lipps R. Veverly Fells, MD 11/01/2017 9:33 AM

## 2017-11-01 NOTE — Progress Notes (Signed)
Physical Therapy Treatment Patient Details Name: [name] MRN: [id number] DOB: [birth year] Today's Date: 11/01/2017    History of Present Illness Pt is a 72 y/o female who presents s/p R TKR on 10/30/17. PMH significant for ankle replacement 2013, L TKR 2009, bilateral carpal tunnel release, bilateral cataract extraction, depression.     PT Comments    Pt progressing well with mobility. She required supervision bed mobility, min guard assist transfers and min guard assist ambulation 175 feet with RW. She completed LE exercises in bed prior to mobility. Pt positioned in recliner with feet elevated at end of session.    Follow Up Recommendations  Follow surgeon's recommendation for DC plan and follow-up therapies     Equipment Recommendations  None recommended by PT    Recommendations for Other Services       Precautions / Restrictions Precautions Precautions: Fall;Knee Precaution Comments: reviewed no pillow under knee Required Braces or Orthoses: Knee Immobilizer - Right Knee Immobilizer - Right: Other (comment)(in bed) Restrictions Weight Bearing Restrictions: Yes RLE Weight Bearing: Weight bearing as tolerated    Mobility  Bed Mobility Overal bed mobility: Needs Assistance Bed Mobility: Supine to Sit     Supine to sit: Supervision     General bed mobility comments: +rail, supervision for safety, no physical assist  Transfers Overall transfer level: Needs assistance Equipment used: Rolling walker (2 wheeled) Transfers: Sit to/from Stand Sit to Stand: Min guard         General transfer comment: cues for hand placement, min guard for safety, no physical assist  Ambulation/Gait Ambulation/Gait assistance: Min guard Gait Distance (Feet): 175 Feet Assistive device: Rolling walker (2 wheeled) Gait Pattern/deviations: Step-to pattern;Decreased stride length;Decreased weight shift to right;Antalgic Gait velocity: Decreased Gait velocity interpretation:  1.31 - 2.62 ft/sec, indicative of limited community ambulator General Gait Details: cues for heel strike and toe off RLE. Pt tends to hip hike to protect knee.   Stairs             Wheelchair Mobility    Modified Rankin (Stroke Patients Only)       Balance Overall balance assessment: Needs assistance Sitting-balance support: Feet supported;No upper extremity supported Sitting balance-Leahy Scale: Good     Standing balance support: Bilateral upper extremity supported;During functional activity Standing balance-Leahy Scale: Poor Standing balance comment: Reliant on RW for balance support                            Cognition Arousal/Alertness: Awake/alert Behavior During Therapy: WFL for tasks assessed/performed Overall Cognitive Status: Within Functional Limits for tasks assessed                                        Exercises Total Joint Exercises Ankle Circles/Pumps: AROM;Both;20 reps Quad Sets: AROM;Right;10 reps Short Arc Quad: AROM;Right;10 reps Heel Slides: 10 reps;Right;AROM Hip ABduction/ADduction: AROM;Right;10 reps Straight Leg Raises: AAROM;Right;10 reps Goniometric ROM: 0-65 R knee    General Comments        Pertinent Vitals/Pain Pain Assessment: 0-10 Pain Score: 5  Pain Location: R knee Pain Descriptors / Indicators: Grimacing;Operative site guarding Pain Intervention(s): Monitored during session;Repositioned    Home Living                      Prior Function  PT Goals (current goals can now be found in the care plan section) Acute Rehab PT Goals Patient Stated Goal: home today PT Goal Formulation: With patient Time For Goal Achievement: 11/07/17 Potential to Achieve Goals: Good Progress towards PT goals: Progressing toward goals    Frequency    7X/week      PT Plan Current plan remains appropriate    Co-evaluation              AM-PAC PT "6 Clicks" Daily Activity   Outcome Measure  Difficulty turning over in bed (including adjusting bedclothes, sheets and blankets)?: A Little Difficulty moving from lying on back to sitting on the side of the bed? : A Little Difficulty sitting down on and standing up from a chair with arms (e.g., wheelchair, bedside commode, etc,.)?: A Little Help needed moving to and from a bed to chair (including a wheelchair)?: A Little Help needed walking in hospital room?: A Little Help needed climbing 3-5 steps with a railing? : A Lot 6 Click Score: 17    End of Session Equipment Utilized During Treatment: Gait belt Activity Tolerance: Patient tolerated treatment well Patient left: in chair;with call bell/phone within reach;with family/visitor present Nurse Communication: Mobility status PT Visit Diagnosis: Unsteadiness on feet (R26.81);Muscle weakness (generalized) (M62.81);Pain Pain - Right/Left: Right Pain - part of body: Knee     Time: 0940-1005 PT Time Calculation (min) (ACUTE ONLY): 25 min  Charges:  $Gait Training: 8-22 mins $Therapeutic Exercise: 8-22 mins                     Lorrin Goodell, PT  Office # [phone] Pager [phone]    [name] 11/01/2017, 10:12 AM

## 2017-11-01 NOTE — Discharge Summary (Signed)
Orthopedic Discharge Summary        Physician Discharge Summary  Patient ID: [name] MRN: [id number] DOB/AGE: 1945/12/04 72 y.o.  Admit date: 10/30/2017 Discharge date: 11/01/2017   Procedures:  Procedure(s) (LRB): RIGHT TOTAL KNEE ARTHROPLASTY (Right)  Attending Physician:  Dr. Esmond Plants  Admission Diagnoses:   Right knee end stage arthritis  Discharge Diagnoses:  Right knee end state arthritis   Past Medical History:  Diagnosis Date  . Allergy   . Arthritis   . Contact dermatitis and other eczema, due to unspecified cause   . Depression   . Encounter for long-term (current) use of other medications   . Hyperlipidemia   . Other and unspecified hyperlipidemia   . Pain in joint, ankle and foot   . Screening for malignant neoplasm of the cervix     PCP: Lauree Chandler, NP   Discharged Condition: good  Hospital Course:  Patient underwent the above stated procedure on 10/30/2017. Patient tolerated the procedure well and brought to the recovery room in good condition and subsequently to the floor. Patient had an uncomplicated hospital course and was stable for discharge.   Disposition: Discharge disposition: 01-Home or Self Care      with follow up in 2 weeks   Follow-up Information    Netta Cedars, MD. Call in 2 weeks.   Specialty:  Orthopedic Surgery Why:  [phone] Contact information: [address] [place] [address] [phone]           Discharge Instructions    Call MD / Call 911   Complete by:  As directed    If you experience chest pain or shortness of breath, CALL 911 and be transported to the hospital emergency room.  If you develope a fever above 101 F, pus (white drainage) or increased drainage or redness at the wound, or calf pain, call your surgeon's office.   Constipation Prevention   Complete by:  As directed    Drink plenty of fluids.  Prune juice may be helpful.  You may use a stool  softener, such as Colace (over the counter) 100 mg twice a day.  Use MiraLax (over the counter) for constipation as needed.   Diet - low sodium heart healthy   Complete by:  As directed    Driving restrictions   Complete by:  As directed    No driving for 2 weeks   Increase activity slowly as tolerated   Complete by:  As directed       Allergies as of 11/01/2017      Reactions   Morphine And Related Itching      Medication List    TAKE these medications   acetaminophen 500 MG tablet Commonly known as:  TYLENOL Take 500-1,000 mg by mouth every 6 (six) hours as needed (for pain.).   aspirin 81 MG chewable tablet Chew 1 tablet (81 mg total) by mouth 2 (two) times daily.   buPROPion 150 MG 24 hr tablet Commonly known as:  WELLBUTRIN XL TAKE ONE TABLET BY MOUTH ONE TIME DAILY   CALCIUM/D3 ADULT GUMMIES PO Take 2 tablets by mouth daily.   celecoxib 200 MG capsule Commonly known as:  CELEBREX Take 200 mg by mouth daily.   famotidine 20 MG tablet Commonly known as:  PEPCID Take 20 mg by mouth daily as needed for heartburn or indigestion.   levocetirizine 5 MG tablet Commonly known as:  XYZAL Take 5 mg by mouth daily.  methocarbamol 500 MG tablet Commonly known as:  ROBAXIN Take 1 tablet (500 mg total) by mouth 3 (three) times daily as needed.   montelukast 10 MG tablet Commonly known as:  SINGULAIR Take 10 mg by mouth at bedtime.   multivitamin with minerals Tabs tablet Take 1 tablet by mouth daily.   OPCON-A 0.027-0.315 % Soln Generic drug:  Naphazoline-Pheniramine Place 1 drop into both eyes 2 (two) times daily as needed (allergy eyes).   oxyCODONE-acetaminophen 5-325 MG tablet Commonly known as:  PERCOCET/ROXICET Take 1-2 tablets by mouth every 4 (four) hours as needed for severe pain.   Vitamin D3 2000 units Tabs Take 2,000 Units by mouth daily.         Signed: Augustin Schooling 11/01/2017, 9:46 AM  [facility] is now [facility] [employer] [address] [place], [place], [place] [address] Phone: [place]

## 2017-11-01 NOTE — Plan of Care (Signed)

## 2017-11-01 NOTE — Progress Notes (Signed)
Patient discharging home today. Discharge instructions explained to patient and she verbalized understanding. Took all personal belongings. No further questions or concerns voiced.  

## 2017-11-02 ENCOUNTER — Other Ambulatory Visit: Payer: Self-pay

## 2017-11-02 ENCOUNTER — Ambulatory Visit (HOSPITAL_COMMUNITY): Payer: Medicare Other

## 2017-11-02 ENCOUNTER — Encounter (HOSPITAL_COMMUNITY): Payer: Self-pay

## 2017-11-02 ENCOUNTER — Ambulatory Visit: Payer: Medicare Other

## 2017-11-02 DIAGNOSIS — M25561 Pain in right knee: Secondary | ICD-10-CM

## 2017-11-02 DIAGNOSIS — R600 Localized edema: Secondary | ICD-10-CM

## 2017-11-02 DIAGNOSIS — R262 Difficulty in walking, not elsewhere classified: Secondary | ICD-10-CM

## 2017-11-02 DIAGNOSIS — M6281 Muscle weakness (generalized): Secondary | ICD-10-CM | POA: Diagnosis not present

## 2017-11-02 DIAGNOSIS — M25661 Stiffness of right knee, not elsewhere classified: Secondary | ICD-10-CM | POA: Diagnosis not present

## 2017-11-02 NOTE — Patient Instructions (Signed)
Access Code: [email or web address]  URL: [email or web address]  Date: 11/02/2017  Prepared by: Geraldine Solar   Exercises Long Sitting Quad Set - 10 reps - 3 sets - 5-10seconds hold - 1-2x daily - 7x weekly Supine Heel Slide with Strap - 10 reps - 3 sets - 5-10sec hold - 1x daily - 7x weekly Hooklying Hamstring Stretch with Strap - 3-5 reps - 30-60seconds hold - 1-2x daily - 7x weekly

## 2017-11-02 NOTE — Therapy (Addendum)
[place], [place], [address] Phone: [phone]   Fax:  [phone]  Physical Therapy Evaluation  Patient Details  Name: [name] MRN: [id number] Date of Birth: 1945-07-06 Referring Provider: Netta Cedars, MD   Encounter Date: 11/02/2017  PT End of Session - 11/02/17 1543    Visit Number  1    Number of Visits  19    Date for PT Re-Evaluation  12/14/17   mini reassess 11/23/17   Authorization Type  Medicare (Secondary: [employer])    Authorization Time Period  11/02/17 to 12/14/17    Authorization - Visit Number  16   Authorization - Number of Visits  60    PT Start Time  0258    PT Stop Time  1107    PT Time Calculation (min)  35 min    Activity Tolerance  Patient tolerated treatment well    Behavior During Therapy  [facility] for tasks assessed/performed       Past Medical History:  Diagnosis Date  . Allergy   . Arthritis   . Contact dermatitis and other eczema, due to unspecified cause   . Depression   . Encounter for long-term (current) use of other medications   . Hyperlipidemia   . Other and unspecified hyperlipidemia   . Pain in joint, ankle and foot   . Screening for malignant neoplasm of the cervix     Past Surgical History:  Procedure Laterality Date  . ANKLE SURGERY  05/2006   Right Ankle  . Carpel TUnnel Release  [id number]   [id number]), Left(2006)  . CATARACT EXTRACTION Left 10/03/14  . CATARACT EXTRACTION Right 11/16/2014  . COLONOSCOPY    . DIAGNOSTIC LAPAROSCOPY  1980  . JOINT REPLACEMENT     Total Knee(L)-11/05/07  . KNEE ARTHROPLASTY  05/2007   Left  . RHINOPLASTY  1969  . TONSILLECTOMY     @ 72 y.o.  . TOTAL ANKLE ARTHROPLASTY  10/30/2011   Procedure: TOTAL ANKLE ARTHOPLASTY;  Surgeon: Wylene Simmer, MD;  Location: [place];  Service: Orthopedics;  Laterality: Right;  RIGHT TOTAL ANKLE REPLACEMENT, POSSIBLE GASTROC RESESSION  . TOTAL KNEE ARTHROPLASTY Right 10/30/2017   Procedure:  RIGHT TOTAL KNEE ARTHROPLASTY;  Surgeon: Netta Cedars, MD;  Location: [place];  Service: Orthopedics;  Laterality: Right;  . TUBAL LIGATION  1980    There were no vitals filed for this visit.   Subjective Assessment - 11/02/17 1037    Subjective  Pt reports that she underwent R TKA on 10/30/17 by Dr. Netta Cedars. She did not have HHPT and she is currently using a RW to ambulate. She states that her knee block didn't take so her pain was much higher than anticipated following her surgery but it is controlled now to a tolerable level. She is having the most difficulty with moving around and standing. She states that her flexing is going well.     Limitations  Sitting;Standing;Walking;House hold activities    How long can you sit comfortably?  1 hour    How long can you stand comfortably?  10 mins    How long can you walk comfortably?  maybe 10-15 mins    Patient Stated Goals  rehab the knee where she can bend and straighten it and walk wihtout assistance    Currently in Pain?  Yes    Pain Score  5     Pain Location  Knee    Pain Orientation  Right  Pain Descriptors / Indicators  Aching;Dull    Pain Type  Surgical pain    Pain Onset  In the past 7 days    Pain Frequency  Constant    Aggravating Factors   WB RLE, sitting for too long    Pain Relieving Factors  rest, ice, meds    Effect of Pain on Daily Activities  increases         [facility] PT Assessment - 11/02/17 0001      Assessment   Medical Diagnosis  R TKA    Referring Provider  Netta Cedars, MD    Onset Date/Surgical Date  10/30/17    Next MD Visit  11/05/17    Prior Therapy  OPPT for R arthroscopic surgery prior to TKA      Balance Screen   Has the patient fallen in the past 6 months  No    Has the patient had a decrease in activity level because of a fear of falling?   No    Is the patient reluctant to leave their home because of a fear of falling?   No      Prior Function   Level of Independence  Independent     Vocation  Retired    Leisure  trains and shows [employer]       Cognition   Overall Cognitive Status  Within Functional Limits for tasks assessed    Area of Impairment  --      Observation/Other Assessments   Focus on Therapeutic Outcomes (FOTO)   62% limitation      Observation/Other Assessments-Edema    Edema  Circumferential      Circumferential Edema   Circumferential - Right  38cm, joint line    Circumferential - Left   33.8cm, joint line      ROM / Strength   AROM / PROM / Strength  AROM;Strength      AROM   AROM Assessment Site  Knee    Right/Left Knee  Right    Right Knee Extension  10    Right Knee Flexion  60      PROM   Right Knee Extension  --    Right Knee Flexion  --      Strength   Strength Assessment Site  Hip;Knee;Ankle    Right Hip Flexion  3+/5    Right Hip Extension  3+/5    Right Hip ABduction  3+/5    Left Hip Flexion  5/5    Left Hip Extension  4-/5    Left Hip ABduction  4/5    Right Knee Flexion  3+/5    Right Knee Extension  3+/5    Left Knee Flexion  5/5    Left Knee Extension  5/5    Right Ankle Dorsiflexion  4+/5    Left Ankle Dorsiflexion  4+/5      Palpation   Patella mobility  hypomobile throughout      Ambulation/Gait   Ambulation Distance (Feet)  306 Feet   3MWT   Assistive device  Rolling walker    Gait Pattern  Step-through pattern;Decreased stance time - left;Decreased hip/knee flexion - right;Decreased dorsiflexion - right;Right circumduction;Antalgic;Trendelenburg   decreased R heel strike     Balance   Balance Assessed  Yes      Static Standing Balance   Static Standing - Balance Support  No upper extremity supported    Static Standing Balance -  Activities   Single Leg  Stance - Right Leg;Single Leg Stance - Left Leg    Static Standing - Comment/# of Minutes  R: 3sec or < L: 9.86sec      Standardized Balance Assessment   Standardized Balance Assessment  Five Times Sit to Stand    Five times sit to  stand comments   23.7sec, intermittent UE, RLE extended            Objective measurements completed on examination: See above findings.         PT Education - 11/02/17 1542    Education Details  exam findings, POC, HEP    Person(s) Educated  Patient    Methods  Explanation;Demonstration    Comprehension  Verbalized understanding;Returned demonstration       PT Short Term Goals - 11/02/17 1546      PT SHORT TERM GOAL #1   Title  Pt will have improved R knee AROM from 5-90deg in order to decrease pain and maximize gait.    Time  3    Period  Weeks    Status  New    Target Date  11/23/17      PT SHORT TERM GOAL #2   Title  Pt will have decreased edema by 2cm or > in order to maximize R knee AROM.     Time  3    Period  Weeks    Status  New      PT SHORT TERM GOAL #3   Title  Pt will have 1/2 grade improvement in MMT throughout in order to maximize balance and functional mobility.    Time  3    Period  Weeks    Status  New        PT Long Term Goals - 11/02/17 1547      PT LONG TERM GOAL #1   Title  Pt will have improved R knee AROM from 0-125deg in order to further maximize gait and stair ambulation as well as floor transfers.    Time  6    Period  Weeks    Status  New    Target Date  12/14/17      PT LONG TERM GOAL #2   Title  Pt will have 1 grade improvement in MMT throughout in order to further maximize gait, stair ambulation, and floor transfers.    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    Time  6    Period  Weeks    Status   New      PT LONG TERM GOAL #5   Title  Pt will have improved 5xSTS to 12 sec or < with proper form in order to demo improved functional strength and maximize her functional mobility.    Time  6    Period  Weeks    Status  New  Plan - 11/02/17 1544    Clinical Impression Statement  Pt is pleasant 72 YO F who presents to OPPT 3 days post-op for R TKA by Dr. Netta Cedars on 10/30/17. Pt has post-op deficits in edema, ROM, MMT, balance, gait, functional strength, and functional mobility. AROM today was 10-60deg and she was noted to have approximately 4cm of swelling at joint line. Pt needs skilled PT intervention to address these impairments to decrease her pain, improve her ROM and strength in order to maximize return to PLOF.    Clinical Presentation  Stable    Clinical Presentation due to:  objective tests and measures (see flowsheets)    Clinical Decision Making  Low    Rehab Potential  Good    PT Frequency  3x / week    PT Duration  6 weeks    PT Treatment/Interventions  ADLs/Self Care Home Management;Aquatic Therapy;Cryotherapy;Electrical Stimulation;Moist Heat;Ultrasound;DME Instruction;Gait training;Stair training;Functional mobility training;Therapeutic activities;Therapeutic exercise;Balance training;Neuromuscular re-education;Patient/family education;Orthotic Fit/Training;Manual techniques;Manual lymph drainage;Scar mobilization;Dry needling;Passive range of motion;Energy conservation;Taping;Spinal Manipulations;Joint Manipulations    PT Next Visit Plan  review eval and HEP; focus initially on R knee AROM and edema prior to increasing strengthening; address floor transfers once ROM normalized and strength starts improving    PT Home Exercise Plan  eval: quad sets, heel slides, supine HS stretch    Consulted and Agree with Plan of Care  Patient       Patient will benefit from skilled therapeutic intervention in order to improve the following deficits and impairments:   Abnormal gait, Decreased activity tolerance, Decreased balance, Decreased endurance, Decreased mobility, Decreased range of motion, Decreased scar mobility, Decreased strength, Difficulty walking, Hypomobility, Increased edema, Increased fascial restricitons, Impaired flexibility, Improper body mechanics, Pain  Visit Diagnosis: Acute pain of right knee - Plan: PT plan of care cert/re-cert  Localized edema - Plan: PT plan of care cert/re-cert  Muscle weakness (generalized) - Plan: PT plan of care cert/re-cert  Difficulty in walking, not elsewhere classified - Plan: PT plan of care cert/re-cert     Problem List Patient Active Problem List   Diagnosis Date Noted  . Status post total knee replacement, right 10/30/2017  . Obesity (BMI 30.0-34.9) 04/30/2017  . 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 Vaginal dryness, menopausal 08/21/2014  . Osteopenia 12/14/2013  . Essential hypertension 12/14/2013  . Complex regional pain syndrome of lower limb 10/04/2012  . Hyperlipidemia 10/04/2012  . Allergic rhinitis 10/04/2012  . Generalized osteoarthrosis, involving multiple sites 10/04/2012      Geraldine Solar PT, DPT  [place] [address] [place], [place], [address] Phone: [phone]   Fax:  [phone]  Name: NIHARIKA SAVINO MRN: [id number] Date of Birth: 1945-04-27

## 2017-11-02 NOTE — Anesthesia Postprocedure Evaluation (Signed)
Anesthesia Post Note  Patient: [name]  Procedure(s) Performed: RIGHT TOTAL KNEE ARTHROPLASTY (Right Knee)     Patient location during evaluation: PACU Anesthesia Type: General and Regional Level of consciousness: awake and alert Pain management: pain level controlled Vital Signs Assessment: post-procedure vital signs reviewed and stable Respiratory status: spontaneous breathing, nonlabored ventilation, respiratory function stable and patient connected to nasal cannula oxygen Cardiovascular status: blood pressure returned to baseline and stable Postop Assessment: no apparent nausea or vomiting Anesthetic complications: no    Last Vitals:  Vitals:   10/31/17 1938 11/01/17 0443  BP: (!) 142/73 140/70  Pulse: 98 93  Resp:    Temp: 37 C 37.3 C  SpO2: 94% 94%    Last Pain:  Vitals:   11/01/17 0905  TempSrc:   PainSc: 4    Pain Goal: Patients Stated Pain Goal: 2 (11/01/17 0200)               Lidia Collum

## 2017-11-04 ENCOUNTER — Ambulatory Visit (HOSPITAL_COMMUNITY): Payer: Medicare Other

## 2017-11-04 ENCOUNTER — Encounter (HOSPITAL_COMMUNITY): Payer: Self-pay

## 2017-11-04 DIAGNOSIS — R262 Difficulty in walking, not elsewhere classified: Secondary | ICD-10-CM | POA: Diagnosis not present

## 2017-11-04 DIAGNOSIS — M6281 Muscle weakness (generalized): Secondary | ICD-10-CM | POA: Diagnosis not present

## 2017-11-04 DIAGNOSIS — M25561 Pain in right knee: Secondary | ICD-10-CM

## 2017-11-04 DIAGNOSIS — M25661 Stiffness of right knee, not elsewhere classified: Secondary | ICD-10-CM | POA: Diagnosis not present

## 2017-11-04 DIAGNOSIS — R600 Localized edema: Secondary | ICD-10-CM

## 2017-11-04 NOTE — Therapy (Signed)
[place], [place], [address] Phone: [phone]   Fax:  [phone]  Physical Therapy Treatment  Patient Details  Name: [name] MRN: [id number] Date of Birth: 1946/01/05 Referring Provider: Netta Cedars, MD   Encounter Date: 11/04/2017  PT End of Session - 11/04/17 0903    Visit Number  2    Number of Visits  19    Date for PT Re-Evaluation  12/14/17   mini reassess 11/23/17   Authorization Type  Medicare (Secondary: [employer])    Authorization Time Period  11/02/17 to 12/14/17    Authorization - Visit Number  2    Authorization - Number of Visits  10    PT Start Time  0900    PT Stop Time  0941    PT Time Calculation (min)  41 min    Activity Tolerance  Patient tolerated treatment well    Behavior During Therapy  [facility] for tasks assessed/performed       Past Medical History:  Diagnosis Date  . Allergy   . Arthritis   . Contact dermatitis and other eczema, due to unspecified cause   . Depression   . Encounter for long-term (current) use of other medications   . Hyperlipidemia   . Other and unspecified hyperlipidemia   . Pain in joint, ankle and foot   . Screening for malignant neoplasm of the cervix     Past Surgical History:  Procedure Laterality Date  . ANKLE SURGERY  05/2006   Right Ankle  . Carpel TUnnel Release  [id number]   [id number]), Left(2006)  . CATARACT EXTRACTION Left 10/03/14  . CATARACT EXTRACTION Right 11/16/2014  . COLONOSCOPY    . DIAGNOSTIC LAPAROSCOPY  1980  . JOINT REPLACEMENT     Total Knee(L)-11/05/07  . KNEE ARTHROPLASTY  05/2007   Left  . RHINOPLASTY  1969  . TONSILLECTOMY     @ 72 y.o.  . TOTAL ANKLE ARTHROPLASTY  10/30/2011   Procedure: TOTAL ANKLE ARTHOPLASTY;  Surgeon: Wylene Simmer, MD;  Location: [place];  Service: Orthopedics;  Laterality: Right;  RIGHT TOTAL ANKLE REPLACEMENT, POSSIBLE GASTROC RESESSION  . TOTAL KNEE ARTHROPLASTY Right 10/30/2017   Procedure: RIGHT  TOTAL KNEE ARTHROPLASTY;  Surgeon: Netta Cedars, MD;  Location: [facility];  Service: Orthopedics;  Laterality: Right;  . TUBAL LIGATION  1980    There were no vitals filed for this visit.  Subjective Assessment - 11/04/17 0903    Subjective  Pt states that she was in increased pain following Monday. She states that when she got home after her eval and did a lot of walking and all Monday night and yesterday she was in a great deal of increased pain. She states that moving it when sitting is not painful, but when she is WB her knee pain increases.    Limitations  Sitting;Standing;Walking;House hold activities    How long can you sit comfortably?  1 hour    How long can you stand comfortably?  10 mins    How long can you walk comfortably?  maybe 10-15 mins    Patient Stated Goals  rehab the knee where she can bend and straighten it and walk wihtout assistance    Currently in Pain?  Yes    Pain Score  7     Pain Location  Knee    Pain Orientation  Right    Pain Descriptors / Indicators  Aching;Dull    Pain Type  Surgical pain    Pain Onset  In the past 7 days    Pain Frequency  Constant    Aggravating Factors   WB RLE, sitting for too long    Pain Relieving Factors  rest, ice, meds    Effect of Pain on Daily Activities  increases               [facility] Adult PT Treatment/Exercise - 11/04/17 0001      Exercises   Exercises  Knee/Hip      Knee/Hip Exercises: Stretches   Passive Hamstring Stretch  Right;3 reps;30 seconds    Passive Hamstring Stretch Limitations  supine with rope      Knee/Hip Exercises: Supine   Quad Sets  Right;15 reps    Quad Sets Limitations  5" holds    Short Arc [employer]  Right;2 sets;15 reps    Short Arc Quad Sets Limitations  3-5" holds    Heel Slides  Right;2 sets;15 reps    Heel Slides Limitations  5" holds    Knee Extension Limitations  8    Knee Flexion Limitations  64      Manual Therapy   Manual Therapy  Edema management    Manual therapy  comments  completed separate rest of session    Edema Management  retro massage BLE elevated to decrease edema and pain             PT Education - 11/04/17 0903    Education Details  normal to have increased pain with increased activity; continue HEP    Person(s) Educated  Patient    Methods  Explanation    Comprehension  Verbalized understanding       PT Short Term Goals - 11/02/17 1546      PT SHORT TERM GOAL #1   Title  Pt will have improved R knee AROM from 5-90deg in order to decrease pain and maximize gait.    Time  3    Period  Weeks    Status  New    Target Date  11/23/17      PT SHORT TERM GOAL #2   Title  Pt will have decreased edema by 2cm or > in order to maximize R knee AROM.     Time  3    Period  Weeks    Status  New      PT SHORT TERM GOAL #3   Title  Pt will have 1/2 grade improvement in MMT throughout in order to maximize balance and functional mobility.    Time  3    Period  Weeks    Status  New        PT Long Term Goals - 11/02/17 1547      PT LONG TERM GOAL #1   Title  Pt will have improved R knee AROM from 0-125deg in order to further maximize gait and stair ambulation as well as floor transfers.    Time  6    Period  Weeks    Status  New    Target Date  12/14/17      PT LONG TERM GOAL #2   Title  Pt will have 1 grade improvement in MMT throughout in order to further maximize gait, stair ambulation, and floor transfers.    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    Time  6    Period  Weeks    Status  New       PT LONG TERM GOAL #5   Title  Pt will have improved 5xSTS to 12 sec or < with proper form in order to demo improved functional strength and maximize her functional mobility.    Time  6    Period  Weeks    Status  New            Plan - 11/04/17 7096    Clinical Impression Statement  Pt presents to therapy stating that she had significant increase in R knee pain following her eval on Monday. She is mainly having difficulty with WB through it as she was able to complete her HEP without increases in pain. She stated that she did a lot of walking when she got home following the eval. She has no erythema, no abnormal warmth around the knee, and no increased swelling; PT educated pt that she is only 5 days post-op and the increase in activity level is what likely caused her increase in pain as she has no red flags or s/s of infection, however, did educate her on the s/s of infection and to go to the ED if any of these s/s occurred and she verbalized understanding. Most of session focused on edema management for her swelling and pain control as well as some ROM work. AROM at EOS 8-64deg, was 10-60 Monday. Continue as planned, progressing as able.     Rehab Potential  Good    PT Frequency  3x / week    PT Duration  6 weeks    PT Treatment/Interventions  ADLs/Self Care Home Management;Aquatic Therapy;Cryotherapy;Electrical Stimulation;Moist Heat;Ultrasound;DME Instruction;Gait training;Stair training;Functional mobility training;Therapeutic activities;Therapeutic exercise;Balance training;Neuromuscular re-education;Patient/family education;Orthotic Fit/Training;Manual techniques;Manual lymph drainage;Scar mobilization;Dry needling;Passive range of motion;Energy conservation;Taping;Spinal Manipulations;Joint Manipulations    PT Next Visit Plan  continue focus on R knee AROM and edema prior to increasing strengthening; address floor transfers once ROM normalized and strength starts improving    PT Home  Exercise Plan  eval: quad sets, heel slides, supine HS stretch    Consulted and Agree with Plan of Care  Patient       Patient will benefit from skilled therapeutic intervention in order to improve the following deficits and impairments:  Abnormal gait, Decreased activity tolerance, Decreased balance, Decreased endurance, Decreased mobility, Decreased range of motion, Decreased scar mobility, Decreased strength, Difficulty walking, Hypomobility, Increased edema, Increased fascial restricitons, Impaired flexibility, Improper body mechanics, Pain  Visit Diagnosis: Acute pain of right knee  Localized edema  Muscle weakness (generalized)  Difficulty in walking, not elsewhere classified     Problem List Patient Active Problem List   Diagnosis Date Noted  . Status post total knee replacement, right 10/30/2017  . Obesity (BMI 30.0-34.9) 04/30/2017  . 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 Vaginal dryness, menopausal 08/21/2014  . Osteopenia 12/14/2013  . Essential hypertension 12/14/2013  . Complex  regional pain syndrome of lower limb 10/04/2012  . Hyperlipidemia 10/04/2012  . Allergic rhinitis 10/04/2012  . Generalized osteoarthrosis, involving multiple sites 10/04/2012       Geraldine Solar PT, DPT  [facility] [address] [place], [place], [address] Phone: [phone]   Fax:  [phone]  Name: RAYMONDE HAMBLIN MRN: [id number] Date of Birth: September 24, 1945

## 2017-11-05 ENCOUNTER — Other Ambulatory Visit: Payer: Medicare Other

## 2017-11-06 ENCOUNTER — Ambulatory Visit (HOSPITAL_COMMUNITY): Payer: Medicare Other

## 2017-11-06 ENCOUNTER — Other Ambulatory Visit: Payer: Self-pay

## 2017-11-06 ENCOUNTER — Encounter (HOSPITAL_COMMUNITY): Payer: Self-pay

## 2017-11-06 DIAGNOSIS — M25661 Stiffness of right knee, not elsewhere classified: Secondary | ICD-10-CM | POA: Diagnosis not present

## 2017-11-06 DIAGNOSIS — R262 Difficulty in walking, not elsewhere classified: Secondary | ICD-10-CM

## 2017-11-06 DIAGNOSIS — M25561 Pain in right knee: Secondary | ICD-10-CM

## 2017-11-06 DIAGNOSIS — R600 Localized edema: Secondary | ICD-10-CM

## 2017-11-06 DIAGNOSIS — M6281 Muscle weakness (generalized): Secondary | ICD-10-CM

## 2017-11-06 NOTE — Therapy (Signed)
[place], [place], [address] Phone: [phone]   Fax:  [phone]  Physical Therapy Treatment  Patient Details  Name: [name] MRN: [id number] Date of Birth: [birth year] Referring Provider (PT): Netta Cedars, MD   Encounter Date: 11/06/2017  PT End of Session - 11/06/17 1309    Visit Number  3    Number of Visits  19    Date for PT Re-Evaluation  12/14/17   mini reassess 11/23/17   Authorization Type  Medicare (Secondary: [employer])    Authorization Time Period  11/02/17 to 12/14/17    Authorization - Visit Number  3    Authorization - Number of Visits  10    PT Start Time  1302    PT Stop Time  1344    PT Time Calculation (min)  42 min    Activity Tolerance  Patient tolerated treatment well    Behavior During Therapy  [facility] for tasks assessed/performed       Past Medical History:  Diagnosis Date  . Allergy   . Arthritis   . Contact dermatitis and other eczema, due to unspecified cause   . Depression   . Encounter for long-term (current) use of other medications   . Hyperlipidemia   . Other and unspecified hyperlipidemia   . Pain in joint, ankle and foot   . Screening for malignant neoplasm of the cervix     Past Surgical History:  Procedure Laterality Date  . ANKLE SURGERY  05/2006   Right Ankle  . Carpel TUnnel Release  [id number]   [id number]), Left(2006)  . CATARACT EXTRACTION Left 10/03/14  . CATARACT EXTRACTION Right 11/16/2014  . COLONOSCOPY    . DIAGNOSTIC LAPAROSCOPY  1980  . JOINT REPLACEMENT     Total Knee(L)-11/05/07  . KNEE ARTHROPLASTY  05/2007   Left  . RHINOPLASTY  1969  . TONSILLECTOMY     @ 72 y.o.  . TOTAL ANKLE ARTHROPLASTY  10/30/2011   Procedure: TOTAL ANKLE ARTHOPLASTY;  Surgeon: Wylene Simmer, MD;  Location: [facility];  Service: Orthopedics;  Laterality: Right;  RIGHT TOTAL ANKLE REPLACEMENT, POSSIBLE GASTROC RESESSION  . TOTAL KNEE ARTHROPLASTY Right 10/30/2017   Procedure:  RIGHT TOTAL KNEE ARTHROPLASTY;  Surgeon: Netta Cedars, MD;  Location: [facility];  Service: Orthopedics;  Laterality: Right;  . TUBAL LIGATION  1980    There were no vitals filed for this visit.  Subjective Assessment - 11/06/17 1308    Subjective  Patient reports she took it easier the last couple days and that she did not do as much walking as she had on Monday. She reports her pain is getting a little bit better. She reports tonight she is removing her dressing per MD instructions and that she will go for her 2 week follow up next week.     Pertinent History  TKA on 10/30/17    Limitations  Sitting;Standing;Walking;House hold activities    How long can you sit comfortably?  1 hour    How long can you stand comfortably?  10 mins    How long can you walk comfortably?  maybe 10-15 mins    Patient Stated Goals  rehab the knee where she can bend and straighten it and walk wihtout assistance    Currently in Pain?  Yes    Pain Score  5     Pain Location  Knee    Pain Orientation  Right    Pain Descriptors /  Indicators  Aching    Pain Type  Surgical pain    Pain Onset  1 to 4 weeks ago    Pain Frequency  Constant    Aggravating Factors   walking, WB, or being still for too long    Pain Relieving Factors  rest, ice, elevation, medicine       [facility] Adult PT Treatment/Exercise - 11/06/17 0001      Exercises   Exercises  Knee/Hip      Knee/Hip Exercises: Stretches   Passive Hamstring Stretch  Right;3 reps;30 seconds    Passive Hamstring Stretch Limitations  supine with rope; for gastroc and hamstring combo stretch      Knee/Hip Exercises: Supine   Short Arc Quad Sets  Right;2 sets;15 reps    Short Arc Quad Sets Limitations  5 sec holds    Heel Slides  Right;2 sets;20 reps    Heel Slides Limitations  3" holds    Terminal Knee Extension  AROM;Strengthening;Right;2 sets;15 reps    Terminal Knee Extension Limitations  pink ball under knee    Knee Extension Limitations  9    Knee Flexion  Limitations  74      Modalities   Modalities  Cryotherapy      Cryotherapy   Number Minutes Cryotherapy  10 Minutes    Cryotherapy Location  Knee    Type of Cryotherapy  Ice pack   at EOS, not billed     Manual Therapy   Manual Therapy  Edema management    Manual therapy comments  completed separate rest of session    Edema Management  retro massage BLE elevated to decrease edema and pain    Joint Mobilization  AP glide to Rt tibfem joint in supine, garde I/II, 3x 30-45 seconds        PT Education - 11/06/17 1309    Education Details  Educated on ROM today and on exercises throughout. Discussed proper massage technique from distal to proximal to help reduce edema. Educated on ice and to remove if her knee become too cold or after 10 minutes.    Person(s) Educated  Patient    Methods  Explanation    Comprehension  Verbalized understanding       PT Short Term Goals - 11/02/17 1546      PT SHORT TERM GOAL #1   Title  Pt will have improved R knee AROM from 5-90deg in order to decrease pain and maximize gait.    Time  3    Period  Weeks    Status  New    Target Date  11/23/17      PT SHORT TERM GOAL #2   Title  Pt will have decreased edema by 2cm or > in order to maximize R knee AROM.     Time  3    Period  Weeks    Status  New      PT SHORT TERM GOAL #3   Title  Pt will have 1/2 grade improvement in MMT throughout in order to maximize balance and functional mobility.    Time  3    Period  Weeks    Status  New        PT Long Term Goals - 11/02/17 1547      PT LONG TERM GOAL #1   Title  Pt will have improved R knee AROM from 0-125deg in order to further maximize gait and stair ambulation as well as floor transfers.  Time  6    Period  Weeks    Status  New    Target Date  12/14/17      PT LONG TERM GOAL #2   Title  Pt will have 1 grade improvement in MMT throughout in order to further maximize gait, stair ambulation, and floor transfers.    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    Time  6    Period  Weeks    Status  New      PT LONG TERM GOAL #5   Title  Pt will have improved 5xSTS to 12 sec or < with proper form in order to demo improved functional strength and maximize her functional mobility.    Time  6    Period  Weeks    Status  New        Plan - 11/06/17 1309    Clinical Impression Statement  Continued this session with focus on ROM exercises and edema management. Continued with supine exercises for flexion/extension of Rt knee and reviewed benefits/importance of icing and elevating LE. Educated on proper massage technique for pain and edema management. Initiated joint mobilization on Rt knee for flexion with grade I AP glides to manage pain. AROM improved at EOS today to 74 degrees. She will continue to benefit from skilled PT interventions to address impairments and progress independence with mobility to return to PLOF and participate in show dog hunting competitions.     Rehab Potential  Good    PT Frequency  3x / week    PT Duration  6 weeks    PT Treatment/Interventions  ADLs/Self Care Home Management;Aquatic Therapy;Cryotherapy;Electrical Stimulation;Moist Heat;Ultrasound;DME Instruction;Gait training;Stair training;Functional mobility training;Therapeutic activities;Therapeutic exercise;Balance training;Neuromuscular re-education;Patient/family education;Orthotic Fit/Training;Manual techniques;Manual lymph drainage;Scar mobilization;Dry needling;Passive range of motion;Energy conservation;Taping;Spinal  Manipulations;Joint Manipulations    PT Next Visit Plan  Continue focus on R knee AROM and edema prior to increasing strengthening; perform grade I/II mobilization for pain management and increase when edema and pain have decreased; address floor transfers once ROM normalized and strength starts improving    PT Home Exercise Plan  eval: quad sets, heel slides, supine HS stretch    Consulted and Agree with Plan of Care  Patient       Patient will benefit from skilled therapeutic intervention in order to improve the following deficits and impairments:  Abnormal gait, Decreased activity tolerance, Decreased balance, Decreased endurance, Decreased mobility, Decreased range of motion, Decreased scar mobility, Decreased strength, Difficulty walking, Hypomobility, Increased edema, Increased fascial restricitons, Impaired flexibility, Improper body mechanics, Pain  Visit Diagnosis: Acute pain of right knee  Localized edema  Muscle weakness (generalized)  Difficulty in walking, not elsewhere classified     Problem List Patient Active Problem List   Diagnosis Date Noted  . Status post total knee replacement, right 10/30/2017  .  Obesity (BMI 30.0-34.9) 04/30/2017  . 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 Vaginal dryness, menopausal 08/21/2014  . Osteopenia 12/14/2013  . Essential hypertension 12/14/2013  . Complex regional pain syndrome of lower limb 10/04/2012  . Hyperlipidemia 10/04/2012  . Allergic rhinitis 10/04/2012  . Generalized osteoarthrosis, involving multiple sites 10/04/2012    Kipp Brood, PT, DPT Physical Therapist with [facility]  11/06/2017 3:48 PM    [place], [place], [address] Phone: [phone]   Fax:  [phone]  Name: [name] MRN: [id number] Date of Birth: 07/05/1945

## 2017-11-09 ENCOUNTER — Ambulatory Visit (HOSPITAL_COMMUNITY): Payer: Medicare Other

## 2017-11-09 ENCOUNTER — Encounter (HOSPITAL_COMMUNITY): Payer: Self-pay

## 2017-11-09 DIAGNOSIS — R600 Localized edema: Secondary | ICD-10-CM | POA: Diagnosis not present

## 2017-11-09 DIAGNOSIS — M25561 Pain in right knee: Secondary | ICD-10-CM | POA: Diagnosis not present

## 2017-11-09 DIAGNOSIS — R262 Difficulty in walking, not elsewhere classified: Secondary | ICD-10-CM | POA: Diagnosis not present

## 2017-11-09 DIAGNOSIS — M6281 Muscle weakness (generalized): Secondary | ICD-10-CM

## 2017-11-09 DIAGNOSIS — M25661 Stiffness of right knee, not elsewhere classified: Secondary | ICD-10-CM | POA: Diagnosis not present

## 2017-11-09 NOTE — Therapy (Addendum)
[place], [place], [address] Phone: [phone]   Fax:  [phone]  Physical Therapy Treatment  Patient Details  Name: [name] MRN: [id number] Date of Birth: Sep 26, 1945 Referring Provider (PT): Netta Cedars, MD   Encounter Date: 11/09/2017  PT End of Session - 11/09/17 0944    Visit Number  4    Number of Visits  19    Date for PT Re-Evaluation  12/14/17   mini reassess 11/23/17   Authorization Type  Medicare (Secondary: [employer])    Authorization Time Period  11/02/17 to 12/14/17    Authorization - Visit Number  29    Authorization - Number of Visits  60    PT Start Time  0945    PT Stop Time  1026    PT Time Calculation (min)  41 min    Activity Tolerance  Patient tolerated treatment well    Behavior During Therapy  [facility] for tasks assessed/performed       Past Medical History:  Diagnosis Date  . Allergy   . Arthritis   . Contact dermatitis and other eczema, due to unspecified cause   . Depression   . Encounter for long-term (current) use of other medications   . Hyperlipidemia   . Other and unspecified hyperlipidemia   . Pain in joint, ankle and foot   . Screening for malignant neoplasm of the cervix     Past Surgical History:  Procedure Laterality Date  . ANKLE SURGERY  05/2006   Right Ankle  . Carpel TUnnel Release  [id number]   [id number]), Left(2006)  . CATARACT EXTRACTION Left 10/03/14  . CATARACT EXTRACTION Right 11/16/2014  . COLONOSCOPY    . DIAGNOSTIC LAPAROSCOPY  1980  . JOINT REPLACEMENT     Total Knee(L)-11/05/07  . KNEE ARTHROPLASTY  05/2007   Left  . RHINOPLASTY  1969  . TONSILLECTOMY     @ 72 y.o.  . TOTAL ANKLE ARTHROPLASTY  10/30/2011   Procedure: TOTAL ANKLE ARTHOPLASTY;  Surgeon: Wylene Simmer, MD;  Location: [facility];  Service: Orthopedics;  Laterality: Right;  RIGHT TOTAL ANKLE REPLACEMENT, POSSIBLE GASTROC RESESSION  . TOTAL KNEE ARTHROPLASTY Right 10/30/2017   Procedure:  RIGHT TOTAL KNEE ARTHROPLASTY;  Surgeon: Netta Cedars, MD;  Location: [facility];  Service: Orthopedics;  Laterality: Right;  . TUBAL LIGATION  1980    There were no vitals filed for this visit.  Subjective Assessment - 11/09/17 0948    Subjective  Pt states she had a rough weekend with her stomach and her R knee. Currnetly her pain is 6-7/10.     Pertinent History  TKA on 10/30/17    Limitations  Sitting;Standing;Walking;House hold activities    How long can you sit comfortably?  1 hour    How long can you stand comfortably?  10 mins    How long can you walk comfortably?  maybe 10-15 mins    Patient Stated Goals  rehab the knee where she can bend and straighten it and walk wihtout assistance    Currently in Pain?  Yes    Pain Score  7     Pain Location  Knee    Pain Orientation  Right    Pain Descriptors / Indicators  Aching;Dull    Pain Type  Surgical pain    Pain Onset  1 to 4 weeks ago    Pain Frequency  Constant    Aggravating Factors   walking, WB, or  being still for too long    Pain Relieving Factors  rest, ice, elevation, medicine    Effect of Pain on Daily Activities  increases           [facility] Adult PT Treatment/Exercise - 11/09/17 0001      Exercises   Exercises  Knee/Hip      Knee/Hip Exercises: Stretches   Passive Hamstring Stretch  Right;2 reps;30 seconds    Passive Hamstring Stretch Limitations  standing, 6" step    Knee: Self-Stretch to increase Flexion  Right;5 reps;10 seconds    Knee: Self-Stretch Limitations  standing, 6" step    Gastroc Stretch  Both;2 reps;30 seconds    Gastroc Stretch Limitations  slant board      Knee/Hip Exercises: Aerobic   Stationary Bike  x4 mins, rocking for mobility, seat 10      Knee/Hip Exercises: Standing   Knee Flexion  Right;10 reps    Rocker Board  2 minutes    Rocker Board Limitations  R/L      Knee/Hip Exercises: Supine   Quad Sets  Right;15 reps    [employer] Limitations  3-5" holds    Short Arc [employer]  Right;2  sets;15 reps    Short Arc Quad Sets Limitations  5" hold    Knee Extension Limitations  8    Knee Flexion Limitations  76      Manual Therapy   Manual Therapy  Edema management;Joint mobilization    Manual therapy comments  completed separate rest of session    Edema Management  retro massage BLE elevated to decrease edema and pain    Joint Mobilization  patellar mobs all directions for improved mobility            PT Education - 11/09/17 0950    Education Details  natural progressing following TKA, continue RICE method for swelling and pain    Person(s) Educated  Patient    Methods  Explanation;Demonstration    Comprehension  Verbalized understanding;Returned demonstration       PT Short Term Goals - 11/02/17 1546      PT SHORT TERM GOAL #1   Title  Pt will have improved R knee AROM from 5-90deg in order to decrease pain and maximize gait.    Time  3    Period  Weeks    Status  New    Target Date  11/23/17      PT SHORT TERM GOAL #2   Title  Pt will have decreased edema by 2cm or > in order to maximize R knee AROM.     Time  3    Period  Weeks    Status  New      PT SHORT TERM GOAL #3   Title  Pt will have 1/2 grade improvement in MMT throughout in order to maximize balance and functional mobility.    Time  3    Period  Weeks    Status  New        PT Long Term Goals - 11/02/17 1547      PT LONG TERM GOAL #1   Title  Pt will have improved R knee AROM from 0-125deg in order to further maximize gait and stair ambulation as well as floor transfers.    Time  6    Period  Weeks    Status  New    Target Date  12/14/17      PT LONG TERM GOAL #2  Title  Pt will have 1 grade improvement in MMT throughout in order to further maximize gait, stair ambulation, and floor transfers.    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    Time  6    Period  Weeks    Status  New      PT LONG TERM GOAL #5   Title  Pt will have improved 5xSTS to 12 sec or < with proper form in order to demo improved functional strength and maximize her functional mobility.    Time  6    Period  Weeks    Status  New            Plan - 11/09/17 1028    Clinical Impression Statement  Pt reports continued increased pain in R knee. Today's session focused on mobility work and addressing edema and pain. Introduced pt to stationary bike, standing stretching, and rockerboard for mobility; pt tolerating well but did require 1 seated rest break. Ended session with mat level exercises and manual for pain control and mobility. AROM 8-76deg today, a slight improvement from her last treatment session. Educated pt to rest and ice her knee for pain control and she verbalized understanding. Continue as planned, progressing as able.     Rehab Potential  Good    PT Frequency  3x / week    PT Duration  6 weeks    PT Treatment/Interventions  ADLs/Self Care Home Management;Aquatic Therapy;Cryotherapy;Electrical Stimulation;Moist Heat;Ultrasound;DME Instruction;Gait training;Stair training;Functional mobility training;Therapeutic activities;Therapeutic exercise;Balance training;Neuromuscular re-education;Patient/family education;Orthotic Fit/Training;Manual techniques;Manual lymph drainage;Scar mobilization;Dry needling;Passive range of motion;Energy conservation;Taping;Spinal Manipulations;Joint Manipulations    PT Next Visit Plan  Continue focus on R knee AROM and edema prior to increasing strengthening; perform patellar mobs and grade I/II mobilization for pain management  and increase when edema and pain have decreased; address floor transfers once ROM normalized and strength starts improving    PT Home Exercise Plan  eval: quad sets, heel slides, supine HS stretch    Consulted and Agree with Plan of Care  Patient       Patient will benefit from skilled therapeutic intervention in order to improve the following deficits and impairments:  Abnormal gait, Decreased activity tolerance, Decreased balance, Decreased endurance, Decreased mobility, Decreased range of motion, Decreased scar mobility, Decreased strength, Difficulty walking, Hypomobility, Increased edema, Increased fascial restricitons, Impaired flexibility, Improper body mechanics, Pain  Visit Diagnosis: Acute pain of right knee  Localized edema  Muscle weakness (generalized)  Difficulty in walking, not elsewhere classified     Problem List Patient Active Problem List   Diagnosis Date Noted  . Status post total knee replacement, right 10/30/2017  . Obesity (BMI 30.0-34.9) 04/30/2017  . 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 Vaginal dryness, menopausal 08/21/2014  . Osteopenia 12/14/2013  . Essential hypertension 12/14/2013  . Complex regional pain  syndrome of lower limb 10/04/2012  . Hyperlipidemia 10/04/2012  . Allergic rhinitis 10/04/2012  . Generalized osteoarthrosis, involving multiple sites 10/04/2012       Geraldine Solar PT, DPT  [facility] [address] [place], [place], [address] Phone: [phone]   Fax:  [phone]  Name: DANAY MCKELLAR MRN: [id number] Date of Birth: 10-18-1945

## 2017-11-10 ENCOUNTER — Ambulatory Visit: Payer: Medicare Other | Admitting: Nurse Practitioner

## 2017-11-11 ENCOUNTER — Encounter (HOSPITAL_COMMUNITY): Payer: Self-pay

## 2017-11-11 ENCOUNTER — Ambulatory Visit (HOSPITAL_COMMUNITY): Payer: Medicare Other | Attending: Orthopedic Surgery

## 2017-11-11 DIAGNOSIS — M25661 Stiffness of right knee, not elsewhere classified: Secondary | ICD-10-CM | POA: Diagnosis not present

## 2017-11-11 DIAGNOSIS — R262 Difficulty in walking, not elsewhere classified: Secondary | ICD-10-CM

## 2017-11-11 DIAGNOSIS — M6281 Muscle weakness (generalized): Secondary | ICD-10-CM | POA: Diagnosis not present

## 2017-11-11 DIAGNOSIS — R600 Localized edema: Secondary | ICD-10-CM

## 2017-11-11 DIAGNOSIS — M25561 Pain in right knee: Secondary | ICD-10-CM | POA: Diagnosis not present

## 2017-11-11 NOTE — Therapy (Signed)
[place], [place], [address] Phone: [phone]   Fax:  [phone]  Physical Therapy Treatment  Patient Details  Name: [name] MRN: [id number] Date of Birth: [birth year] Referring Provider (PT): Netta Cedars, MD   Encounter Date: 11/11/2017  PT End of Session - 11/11/17 1259    Visit Number  5    Number of Visits  19    Date for PT Re-Evaluation  12/14/17   mini reassess 11/23/17   Authorization Type  Medicare (Secondary: [employer])    Authorization Time Period  11/02/17 to 12/14/17    Authorization - Visit Number  20   corrected visit count of pt's visit limit   Authorization - Number of Visits  10    PT Start Time  1300    PT Stop Time  1345    PT Time Calculation (min)  45 min    Activity Tolerance  Patient tolerated treatment well    Behavior During Therapy  [facility] for tasks assessed/performed       Past Medical History:  Diagnosis Date  . Allergy   . Arthritis   . Contact dermatitis and other eczema, due to unspecified cause   . Depression   . Encounter for long-term (current) use of other medications   . Hyperlipidemia   . Other and unspecified hyperlipidemia   . Pain in joint, ankle and foot   . Screening for malignant neoplasm of the cervix     Past Surgical History:  Procedure Laterality Date  . ANKLE SURGERY  05/2006   Right Ankle  . Carpel TUnnel Release  [id number]   [id number]), Left(2006)  . CATARACT EXTRACTION Left 10/03/14  . CATARACT EXTRACTION Right 11/16/2014  . COLONOSCOPY    . DIAGNOSTIC LAPAROSCOPY  1980  . JOINT REPLACEMENT     Total Knee(L)-11/05/07  . KNEE ARTHROPLASTY  05/2007   Left  . RHINOPLASTY  1969  . TONSILLECTOMY     @ 72 y.o.  . TOTAL ANKLE ARTHROPLASTY  10/30/2011   Procedure: TOTAL ANKLE ARTHOPLASTY;  Surgeon: Wylene Simmer, MD;  Location: [place];  Service: Orthopedics;  Laterality: Right;  RIGHT TOTAL ANKLE REPLACEMENT, POSSIBLE GASTROC RESESSION  . TOTAL  KNEE ARTHROPLASTY Right 10/30/2017   Procedure: RIGHT TOTAL KNEE ARTHROPLASTY;  Surgeon: Netta Cedars, MD;  Location: [place];  Service: Orthopedics;  Laterality: Right;  . TUBAL LIGATION  1980    There were no vitals filed for this visit.  Subjective Assessment - 11/11/17 1259    Subjective  Pt states that she is still constipated and is going to her MD about it tomorrow. She states that overall, she is slowly progressing and her pain is steadily decreasing. Currently about a 4/10.    Pertinent History  TKA on 10/30/17    Limitations  Sitting;Standing;Walking;House hold activities    How long can you sit comfortably?  1 hour    How long can you stand comfortably?  10 mins    How long can you walk comfortably?  maybe 10-15 mins    Patient Stated Goals  rehab the knee where she can bend and straighten it and walk wihtout assistance    Currently in Pain?  Yes    Pain Score  4     Pain Location  Knee    Pain Orientation  Right    Pain Descriptors / Indicators  Aching;Dull    Pain Type  Surgical pain    Pain Onset  1 to 4 weeks ago    Pain Frequency  Constant    Aggravating Factors   walking, WB, or being still for too long    Pain Relieving Factors  rest, ice, elevation, medicine    Effect of Pain on Daily Activities  increases             [facility] Adult PT Treatment/Exercise - 11/11/17 0001      Exercises   Exercises  Knee/Hip      Knee/Hip Exercises: Stretches   Passive Hamstring Stretch  Right;3 reps;30 seconds    Passive Hamstring Stretch Limitations  standing, 8" step    Quad Stretch  Right;3 reps;30 seconds    Quad Stretch Limitations  prone with rope    Knee: Self-Stretch to increase Flexion  Right;5 reps;10 seconds    Knee: Self-Stretch Limitations  standing, 8" step    Gastroc Stretch  Both;3 reps;30 seconds    Gastroc Stretch Limitations  slant board      Knee/Hip Exercises: Aerobic   Stationary Bike  x4 mins, rocking for mobility, seat 8      Knee/Hip Exercises:  Standing   Heel Raises  Both;15 reps    Heel Raises Limitations  heel and toe    Knee Flexion  --    Terminal Knee Extension  Right;10 reps    Terminal Knee Extension Limitations  5" holds, manual resistance    Rocker Board  2 minutes    Rocker Board Limitations  R/L      Knee/Hip Exercises: Supine   Quad Sets  Right;15 reps    [employer] Limitations  3-5" holds    Short Arc [employer]  Right;20 reps    Short Arc Quad Sets Limitations  5" holds    Straight Leg Raises  Right;2 sets;10 reps    Knee Extension Limitations  10    Knee Flexion Limitations  82      Knee/Hip Exercises: Prone   Hamstring Curl  10 reps    Hamstring Curl Limitations  RLE      Manual Therapy   Manual Therapy  Edema management;Joint mobilization    Manual therapy comments  completed separate rest of session    Edema Management  retro massage BLE elevated to decrease edema and pain    Joint Mobilization  patellar mobs all directions for improved mobility             PT Education - 11/11/17 1259    Education Details  continue HEP; use bone foam at home for extension    Person(s) Educated  Patient    Methods  Explanation;Demonstration    Comprehension  Verbalized understanding;Returned demonstration       PT Short Term Goals - 11/02/17 1546      PT SHORT TERM GOAL #1   Title  Pt will have improved R knee AROM from 5-90deg in order to decrease pain and maximize gait.    Time  3    Period  Weeks    Status  New    Target Date  11/23/17      PT SHORT TERM GOAL #2   Title  Pt will have decreased edema by 2cm or > in order to maximize R knee AROM.     Time  3    Period  Weeks    Status  New      PT SHORT TERM GOAL #3   Title  Pt will have 1/2 grade improvement in MMT throughout in  order to maximize balance and functional mobility.    Time  3    Period  Weeks    Status  New        PT Long Term Goals - 11/02/17 1547      PT LONG TERM GOAL #1   Title  Pt will have improved R knee AROM from  0-125deg in order to further maximize gait and stair ambulation as well as floor transfers.    Time  6    Period  Weeks    Status  New    Target Date  12/14/17      PT LONG TERM GOAL #2   Title  Pt will have 1 grade improvement in MMT throughout in order to further maximize gait, stair ambulation, and floor transfers.    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    Time  6    Period  Weeks    Status  New      PT LONG TERM GOAL #5   Title  Pt will have improved 5xSTS to 12 sec or < with proper form in order to demo improved functional strength and maximize her functional mobility.    Time  6    Period  Weeks    Status  New            Plan - 11/11/17 1350    Clinical Impression Statement  Pt making steady progress overall as her pain is steadily decreasing. Continued with mobility work in standing and at mat level this date. Added standing TKE, prone qua stretch, and prone knee flexion to POC this date with good tolerance. Ended with patellar mobs and edema control. AROM 10-82deg this date (was 8-76deg last visit). Pt returns to MD tomorrow so this PT sent today's note to MD's office for updated measurements. Pt stated she had bone foam for extension at home and PT encouraged her to continue using it. Continue POC as planned, progressing as able.     Rehab Potential  Good    PT Frequency  3x / week    PT Duration  6 weeks    PT Treatment/Interventions  ADLs/Self Care Home Management;Aquatic Therapy;Cryotherapy;Electrical Stimulation;Moist Heat;Ultrasound;DME  Instruction;Gait training;Stair training;Functional mobility training;Therapeutic activities;Therapeutic exercise;Balance training;Neuromuscular re-education;Patient/family education;Orthotic Fit/Training;Manual techniques;Manual lymph drainage;Scar mobilization;Dry needling;Passive range of motion;Energy conservation;Taping;Spinal Manipulations;Joint Manipulations    PT Next Visit Plan  Continue focus on R knee AROM and edema prior to increasing strengthening; perform patellar mobs and grade I/II mobilization for pain management and increase when edema and pain have decreased; address floor transfers once ROM normalized and strength starts improving    PT Home Exercise Plan  eval: quad sets, heel slides, supine HS stretch; 10/2: bone foam for extension    Consulted and Agree with Plan of Care  Patient  Patient will benefit from skilled therapeutic intervention in order to improve the following deficits and impairments:  Abnormal gait, Decreased activity tolerance, Decreased balance, Decreased endurance, Decreased mobility, Decreased range of motion, Decreased scar mobility, Decreased strength, Difficulty walking, Hypomobility, Increased edema, Increased fascial restricitons, Impaired flexibility, Improper body mechanics, Pain  Visit Diagnosis: Acute pain of right knee  Localized edema  Muscle weakness (generalized)  Difficulty in walking, not elsewhere classified     Problem List Patient Active Problem List   Diagnosis Date Noted  . Status post total knee replacement, right 10/30/2017  . Obesity (BMI 30.0-34.9) 04/30/2017  . 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 Vaginal dryness, menopausal 08/21/2014  . Osteopenia 12/14/2013  . Essential hypertension 12/14/2013  . Complex regional pain syndrome of lower limb 10/04/2012  . Hyperlipidemia 10/04/2012  . Allergic rhinitis 10/04/2012  . Generalized osteoarthrosis, involving multiple sites 10/04/2012       Geraldine Solar PT, DPT  [place] [address] [place], [place], [address] Phone: [phone]   Fax:  [phone]  Name: AMORITA VANROSSUM MRN: [id number] Date of Birth: 07-29-1945

## 2017-11-12 DIAGNOSIS — Z96651 Presence of right artificial knee joint: Secondary | ICD-10-CM | POA: Diagnosis not present

## 2017-11-12 DIAGNOSIS — Z471 Aftercare following joint replacement surgery: Secondary | ICD-10-CM | POA: Diagnosis not present

## 2017-11-13 ENCOUNTER — Ambulatory Visit (HOSPITAL_COMMUNITY): Payer: Medicare Other

## 2017-11-13 DIAGNOSIS — R262 Difficulty in walking, not elsewhere classified: Secondary | ICD-10-CM

## 2017-11-13 DIAGNOSIS — R600 Localized edema: Secondary | ICD-10-CM

## 2017-11-13 DIAGNOSIS — M6281 Muscle weakness (generalized): Secondary | ICD-10-CM | POA: Diagnosis not present

## 2017-11-13 DIAGNOSIS — M25661 Stiffness of right knee, not elsewhere classified: Secondary | ICD-10-CM | POA: Diagnosis not present

## 2017-11-13 DIAGNOSIS — M25561 Pain in right knee: Secondary | ICD-10-CM | POA: Diagnosis not present

## 2017-11-13 NOTE — Therapy (Signed)
[place], [place], [address] Phone: [phone]   Fax:  [phone]  Physical Therapy Treatment  Patient Details  Name: [name] MRN: [id number] Date of Birth: [birth year] Referring Provider (PT): Netta Cedars, MD   Encounter Date: 11/13/2017  PT End of Session - 11/13/17 1257    Visit Number  6    Number of Visits  19    Date for PT Re-Evaluation  12/14/17   mini reassess 11/23/17   Authorization Type  Medicare (Secondary: [employer])    Authorization Time Period  11/02/17 to 12/14/17    Authorization - Visit Number  21    Authorization - Number of Visits  10    PT Start Time  8676    PT Stop Time  1341    PT Time Calculation (min)  43 min    Activity Tolerance  Patient tolerated treatment well    Behavior During Therapy  [facility] for tasks assessed/performed       Past Medical History:  Diagnosis Date  . Allergy   . Arthritis   . Contact dermatitis and other eczema, due to unspecified cause   . Depression   . Encounter for long-term (current) use of other medications   . Hyperlipidemia   . Other and unspecified hyperlipidemia   . Pain in joint, ankle and foot   . Screening for malignant neoplasm of the cervix     Past Surgical History:  Procedure Laterality Date  . ANKLE SURGERY  05/2006   Right Ankle  . Carpel TUnnel Release  [id number]   [id number]), Left(2006)  . CATARACT EXTRACTION Left 10/03/14  . CATARACT EXTRACTION Right 11/16/2014  . COLONOSCOPY    . DIAGNOSTIC LAPAROSCOPY  1980  . JOINT REPLACEMENT     Total Knee(L)-11/05/07  . KNEE ARTHROPLASTY  05/2007   Left  . RHINOPLASTY  1969  . TONSILLECTOMY     @ 72 y.o.  . TOTAL ANKLE ARTHROPLASTY  10/30/2011   Procedure: TOTAL ANKLE ARTHOPLASTY;  Surgeon: Wylene Simmer, MD;  Location: [place];  Service: Orthopedics;  Laterality: Right;  RIGHT TOTAL ANKLE REPLACEMENT, POSSIBLE GASTROC RESESSION  . TOTAL KNEE ARTHROPLASTY Right 10/30/2017   Procedure:  RIGHT TOTAL KNEE ARTHROPLASTY;  Surgeon: Netta Cedars, MD;  Location: [place];  Service: Orthopedics;  Laterality: Right;  . TUBAL LIGATION  1980    There were no vitals filed for this visit.  Subjective Assessment - 11/13/17 1258    Subjective  Pt reports that overall she is feeling better, her knee will cause her intermittent stabbing pain at her proximal tibia. Other than that, she's okay, still having some achy pain. She states that her MD was pleased with her progress thus far and to continue doing what she's doing.     Pertinent History  TKA on 10/30/17    Limitations  Sitting;Standing;Walking;House hold activities    How long can you sit comfortably?  1 hour    How long can you stand comfortably?  10 mins    How long can you walk comfortably?  maybe 10-15 mins    Patient Stated Goals  rehab the knee where she can bend and straighten it and walk wihtout assistance    Currently in Pain?  Yes    Pain Score  3     Pain Location  Knee    Pain Orientation  Right    Pain Descriptors / Indicators  Aching;Dull    Pain Type  Surgical pain    Pain Onset  1 to 4 weeks ago    Pain Frequency  Constant    Aggravating Factors   walking, WB, or being still for too long    Pain Relieving Factors  rest, ice, elevation, medicine    Effect of Pain on Daily Activities  increases            [facility] Adult PT Treatment/Exercise - 11/13/17 0001      Exercises   Exercises  Knee/Hip      Knee/Hip Exercises: Stretches   Passive Hamstring Stretch  Right;3 reps;30 seconds    Passive Hamstring Stretch Limitations  standing, 8" step    Quad Stretch  Right;3 reps;30 seconds    Quad Stretch Limitations  prone with rope    Knee: Self-Stretch to increase Flexion  Right;10 seconds    Knee: Self-Stretch Limitations  x10 reps, standing, 8" step    Gastroc Stretch  Both;3 reps;30 seconds    Gastroc Stretch Limitations  slant board      Knee/Hip Exercises: Aerobic   Stationary Bike  x4 mins, rocking for  mobility, seat 8      Knee/Hip Exercises: Standing   Heel Raises  Both;15 reps    Heel Raises Limitations  heel and toe    Knee Flexion  Right;15 reps    Terminal Knee Extension  Right;10 reps    Terminal Knee Extension Limitations  5" holds, manual resistance    Hip Abduction  Both;10 reps    Rocker Board  2 minutes    Rocker Board Limitations  R/L      Knee/Hip Exercises: Supine   Short Arc [employer]  Right;20 reps    Short [employer] Quad Sets Limitations  3-5" holds    Straight Leg Raises  Right;15 reps    Knee Extension Limitations  8    Knee Flexion Limitations  84      Manual Therapy   Manual Therapy  Edema management;Soft tissue mobilization    Manual therapy comments  completed separate rest of session    Edema Management  retro massage BLE elevated to decrease edema and pain    Soft tissue mobilization  light STM to hamstrings medially and laterally to decrease pain           PT Education - 11/13/17 1257    Education Details  continue HEP    Person(s) Educated  Patient    Methods  Explanation;Demonstration    Comprehension  Verbalized understanding;Returned demonstration       PT Short Term Goals - 11/02/17 1546      PT SHORT TERM GOAL #1   Title  Pt will have improved R knee AROM from 5-90deg in order to decrease pain and maximize gait.    Time  3    Period  Weeks    Status  New    Target Date  11/23/17      PT SHORT TERM GOAL #2   Title  Pt will have decreased edema by 2cm or > in order to maximize R knee AROM.     Time  3    Period  Weeks    Status  New      PT SHORT TERM GOAL #3   Title  Pt will have 1/2 grade improvement in MMT throughout in order to maximize balance and functional mobility.    Time  3    Period  Weeks    Status  New  PT Long Term Goals - 11/02/17 1547      PT LONG TERM GOAL #1   Title  Pt will have improved R knee AROM from 0-125deg in order to further maximize gait and stair ambulation as well as floor transfers.     Time  6    Period  Weeks    Status  New    Target Date  12/14/17      PT LONG TERM GOAL #2   Title  Pt will have 1 grade improvement in MMT throughout in order to further maximize gait, stair ambulation, and floor transfers.    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    Time  6    Period  Weeks    Status  New      PT LONG TERM GOAL #5   Title  Pt will have improved 5xSTS to 12 sec or < with proper form in order to demo improved functional strength and maximize her functional mobility.    Time  6    Period  Weeks    Status  New            Plan - 11/13/17 1342    Clinical Impression Statement  Pt making steady improvements overall as she states that her pain is overall improving slowly. Continued with established POC focusing on knee mobility and edema. Introduced light STM to medial/lateral HS for pain control and improved ROM. AROM 8 to 84deg this date. Continue as planned, progressing as able.    Rehab Potential  Good    PT Frequency  3x / week    PT Duration  6 weeks    PT Treatment/Interventions  ADLs/Self Care Home Management;Aquatic Therapy;Cryotherapy;Electrical Stimulation;Moist Heat;Ultrasound;DME Instruction;Gait training;Stair training;Functional mobility training;Therapeutic activities;Therapeutic exercise;Balance training;Neuromuscular re-education;Patient/family education;Orthotic Fit/Training;Manual techniques;Manual lymph drainage;Scar mobilization;Dry needling;Passive range of motion;Energy conservation;Taping;Spinal Manipulations;Joint  Manipulations    PT Next Visit Plan  Continue focus on R knee AROM and edema prior to increasing strengthening; perform patellar mobs and grade I/II mobilization for pain management and increase when edema and pain have decreased; address floor transfers once ROM normalized and strength starts improving    PT Home Exercise Plan  eval: quad sets, heel slides, supine HS stretch; 10/2: bone foam for extension    Consulted and Agree with Plan of Care  Patient       Patient will benefit from skilled therapeutic intervention in order to improve the following deficits and impairments:  Abnormal gait, Decreased activity tolerance, Decreased balance, Decreased endurance, Decreased mobility, Decreased range of motion, Decreased scar mobility, Decreased strength, Difficulty walking, Hypomobility, Increased edema, Increased fascial restricitons, Impaired flexibility, Improper body mechanics, Pain  Visit Diagnosis: Acute pain of right knee  Localized edema  Muscle weakness (generalized)  Difficulty in walking, not elsewhere classified     Problem List Patient  Active Problem List   Diagnosis Date Noted  . Status post total knee replacement, right 10/30/2017  . Obesity (BMI 30.0-34.9) 04/30/2017  . 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 Vaginal dryness, menopausal 08/21/2014  . Osteopenia 12/14/2013  . Essential hypertension 12/14/2013  . Complex regional pain syndrome of lower limb 10/04/2012  . Hyperlipidemia 10/04/2012  . Allergic rhinitis 10/04/2012  . Generalized osteoarthrosis, involving multiple sites 10/04/2012       Geraldine Solar PT, DPT  [place] [facility] [address] [place], [place], [address] Phone: [phone]   Fax:  [phone]  Name: KARYNN DEBLASI MRN: [id number] Date of Birth: 1945/06/25

## 2017-11-16 ENCOUNTER — Ambulatory Visit (HOSPITAL_COMMUNITY): Payer: Medicare Other | Admitting: Physical Therapy

## 2017-11-16 DIAGNOSIS — M25661 Stiffness of right knee, not elsewhere classified: Secondary | ICD-10-CM

## 2017-11-16 DIAGNOSIS — R600 Localized edema: Secondary | ICD-10-CM

## 2017-11-16 DIAGNOSIS — M6281 Muscle weakness (generalized): Secondary | ICD-10-CM

## 2017-11-16 DIAGNOSIS — R262 Difficulty in walking, not elsewhere classified: Secondary | ICD-10-CM

## 2017-11-16 DIAGNOSIS — M25561 Pain in right knee: Secondary | ICD-10-CM

## 2017-11-16 NOTE — Therapy (Signed)
[place], [place], [address] Phone: [phone]   Fax:  [phone]  Physical Therapy Treatment  Patient Details  Name: [name] MRN: [id number] Date of Birth: [birth year] Referring Provider (PT): Netta Cedars, MD   Encounter Date: 11/16/2017  PT End of Session - 11/16/17 1656    Visit Number  7    Number of Visits  19    Date for PT Re-Evaluation  12/14/17   mini reassess 11/23/17   Authorization Type  Medicare (Secondary: [employer])    Authorization Time Period  11/02/17 to 12/14/17    PT Start Time  1115    PT Stop Time  1156    PT Time Calculation (min)  41 min    Activity Tolerance  Patient tolerated treatment well    Behavior During Therapy  [facility] for tasks assessed/performed       Past Medical History:  Diagnosis Date  . Allergy   . Arthritis   . Contact dermatitis and other eczema, due to unspecified cause   . Depression   . Encounter for long-term (current) use of other medications   . Hyperlipidemia   . Other and unspecified hyperlipidemia   . Pain in joint, ankle and foot   . Screening for malignant neoplasm of the cervix     Past Surgical History:  Procedure Laterality Date  . ANKLE SURGERY  05/2006   Right Ankle  . Carpel TUnnel Release  [id number]   [id number]), Left(2006)  . CATARACT EXTRACTION Left 10/03/14  . CATARACT EXTRACTION Right 11/16/2014  . COLONOSCOPY    . DIAGNOSTIC LAPAROSCOPY  1980  . JOINT REPLACEMENT     Total Knee(L)-11/05/07  . KNEE ARTHROPLASTY  05/2007   Left  . RHINOPLASTY  1969  . TONSILLECTOMY     @ 72 y.o.  . TOTAL ANKLE ARTHROPLASTY  10/30/2011   Procedure: TOTAL ANKLE ARTHOPLASTY;  Surgeon: Wylene Simmer, MD;  Location: [place];  Service: Orthopedics;  Laterality: Right;  RIGHT TOTAL ANKLE REPLACEMENT, POSSIBLE GASTROC RESESSION  . TOTAL KNEE ARTHROPLASTY Right 10/30/2017   Procedure: RIGHT TOTAL KNEE ARTHROPLASTY;  Surgeon: Netta Cedars, MD;  Location: [place];   Service: Orthopedics;  Laterality: Right;  . TUBAL LIGATION  1980    There were no vitals filed for this visit.  Subjective Assessment - 11/16/17 1119    Subjective  Pt states she's been having some issues with her due to complex regional pain syndrome related to her Rt ankle replacement from 6 years ago.  STates she has called in to her MD to request gabapentin for it.  States she took a pain pill 1 hour ago but still wakes at night with pain 2-3 times.     Currently in Pain?  Yes    Pain Score  3     Pain Location  Knee    Pain Orientation  Right    Pain Descriptors / Indicators  Aching    Pain Type  Surgical pain                       [facility] Adult PT Treatment/Exercise - 11/16/17 1640      Knee/Hip Exercises: Stretches   Passive Hamstring Stretch  Right;3 reps;30 seconds    Passive Hamstring Stretch Limitations  standing, 8" step    Quad Stretch  Right;3 reps;30 seconds    Quad Stretch Limitations  prone with rope    Knee: Self-Stretch to increase  Flexion  Right;10 seconds    Knee: Self-Stretch Limitations  x10 reps, standing, 8" step    Gastroc Stretch  Both;3 reps;30 seconds    Gastroc Stretch Limitations  slant board      Knee/Hip Exercises: Standing   Heel Raises  Both;15 reps    Heel Raises Limitations  heel and toe    Knee Flexion  Right;15 reps    Forward Lunges  Both;10 reps;Limitations    Forward Lunges Limitations  4" step without UE assist    Rocker Board  2 minutes    Rocker Board Limitations  R/L      Knee/Hip Exercises: Supine   Short Arc Quad Sets  Right;20 reps    Straight Leg Raises  Right;15 reps    Knee Extension  AROM    Knee Extension Limitations  7    Knee Flexion  AROM    Knee Flexion Limitations  100      Manual Therapy   Manual Therapy  Myofascial release    Manual therapy comments  completed separate rest of session    Edema Management  --    Myofascial Release  to decrease scar tissue and adhesions                PT Short Term Goals - 11/02/17 1546      PT SHORT TERM GOAL #1   Title  Pt will have improved R knee AROM from 5-90deg in order to decrease pain and maximize gait.    Time  3    Period  Weeks    Status  New    Target Date  11/23/17      PT SHORT TERM GOAL #2   Title  Pt will have decreased edema by 2cm or > in order to maximize R knee AROM.     Time  3    Period  Weeks    Status  New      PT SHORT TERM GOAL #3   Title  Pt will have 1/2 grade improvement in MMT throughout in order to maximize balance and functional mobility.    Time  3    Period  Weeks    Status  New        PT Long Term Goals - 11/02/17 1547      PT LONG TERM GOAL #1   Title  Pt will have improved R knee AROM from 0-125deg in order to further maximize gait and stair ambulation as well as floor transfers.    Time  6    Period  Weeks    Status  New    Target Date  12/14/17      PT LONG TERM GOAL #2   Title  Pt will have 1 grade improvement in MMT throughout in order to further maximize gait, stair ambulation, and floor transfers.    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    Time  6    Period  Weeks    Status  New      PT LONG TERM GOAL #5   Title  Pt will have improved 5xSTS to 12 sec or < with proper form in order to demo improved functional strength and maximize her functional mobility.    Time  6    Period  Weeks    Status  New            Plan - 11/16/17 1657    Clinical Impression Statement  Pt with nerve pain today making it hard to sleep  and complete normal activities.  Contineud with therex to promote increased ROM and function.  Began myofascial techniques to distal scar with noted ahdesions and restrictions. Improved tissue pliability and ROM following manual.  Increased ROM of 7-100 degress (was 8-84 last session).    Rehab Potential  Good    PT Frequency  3x / week    PT Duration  6 weeks    PT Treatment/Interventions  ADLs/Self Care Home Management;Aquatic Therapy;Cryotherapy;Electrical Stimulation;Moist Heat;Ultrasound;DME Instruction;Gait training;Stair training;Functional mobility training;Therapeutic activities;Therapeutic exercise;Balance training;Neuromuscular re-education;Patient/family education;Orthotic Fit/Training;Manual techniques;Manual lymph drainage;Scar mobilization;Dry needling;Passive range of motion;Energy conservation;Taping;Spinal Manipulations;Joint Manipulations    PT Next Visit Plan  Continue focus on R knee AROM and manual to reduce adhesions. Address floor transfers once ROM normalized and strength starts improving    PT Home Exercise Plan  eval: quad sets, heel slides, supine HS stretch; 10/2: bone foam for extension    Consulted and Agree with Plan of Care  Patient       Patient will benefit from skilled therapeutic intervention in order to improve the following deficits and impairments:  Abnormal gait, Decreased activity tolerance, Decreased balance, Decreased endurance, Decreased mobility, Decreased range of motion, Decreased scar mobility, Decreased strength, Difficulty walking, Hypomobility, Increased edema, Increased fascial restricitons, Impaired flexibility, Improper body mechanics, Pain  Visit Diagnosis: No diagnosis found.     Problem List Patient Active Problem List   Diagnosis Date Noted  . Status post total knee replacement, right 10/30/2017  . Obesity (BMI 30.0-34.9) 04/30/2017  . 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 Vaginal dryness, menopausal 08/21/2014  .  Osteopenia 12/14/2013  . Essential hypertension 12/14/2013  . Complex regional pain syndrome of lower limb 10/04/2012  . Hyperlipidemia 10/04/2012  . Allergic rhinitis 10/04/2012  . Generalized osteoarthrosis, involving multiple sites 10/04/2012   Teena Irani, PTA/CLT [phone]  Teena Irani 11/16/2017, 5:00 PM  [facility] [address] [place], [place], [address] Phone: [phone]   Fax:  [phone]  Name: [name] MRN: [id number] Date of Birth: 03-02-1945

## 2017-11-18 ENCOUNTER — Ambulatory Visit (HOSPITAL_COMMUNITY): Payer: Medicare Other

## 2017-11-18 ENCOUNTER — Encounter (HOSPITAL_COMMUNITY): Payer: Self-pay

## 2017-11-18 DIAGNOSIS — M25561 Pain in right knee: Secondary | ICD-10-CM

## 2017-11-18 DIAGNOSIS — R262 Difficulty in walking, not elsewhere classified: Secondary | ICD-10-CM | POA: Diagnosis not present

## 2017-11-18 DIAGNOSIS — M25661 Stiffness of right knee, not elsewhere classified: Secondary | ICD-10-CM | POA: Diagnosis not present

## 2017-11-18 DIAGNOSIS — R600 Localized edema: Secondary | ICD-10-CM

## 2017-11-18 DIAGNOSIS — M6281 Muscle weakness (generalized): Secondary | ICD-10-CM

## 2017-11-18 NOTE — Therapy (Signed)
[place], [place], [address] Phone: [phone]   Fax:  [phone]  Physical Therapy Treatment  Patient Details  Name: [name] MRN: [id number] Date of Birth: 05-04-1945 Referring Provider (PT): Netta Cedars, MD   Encounter Date: 11/18/2017  PT End of Session - 11/18/17 1307    Visit Number  8    Number of Visits  19    Date for PT Re-Evaluation  12/14/17   Minireassess 11/23/17   Authorization Type  Medicare (Secondary: [employer])    Authorization Time Period  11/02/17 to 12/14/17    Authorization - Visit Number  21    Authorization - Number of Visits  10    PT Start Time  1300   4' on bike, not included with charges   PT Stop Time  1353    PT Time Calculation (min)  53 min    Activity Tolerance  Patient tolerated treatment well    Behavior During Therapy  [facility] for tasks assessed/performed       Past Medical History:  Diagnosis Date  . Allergy   . Arthritis   . Contact dermatitis and other eczema, due to unspecified cause   . Depression   . Encounter for long-term (current) use of other medications   . Hyperlipidemia   . Other and unspecified hyperlipidemia   . Pain in joint, ankle and foot   . Screening for malignant neoplasm of the cervix     Past Surgical History:  Procedure Laterality Date  . ANKLE SURGERY  05/2006   Right Ankle  . Carpel TUnnel Release  [id number]   [id number]), Left(2006)  . CATARACT EXTRACTION Left 10/03/14  . CATARACT EXTRACTION Right 11/16/2014  . COLONOSCOPY    . DIAGNOSTIC LAPAROSCOPY  1980  . JOINT REPLACEMENT     Total Knee(L)-11/05/07  . KNEE ARTHROPLASTY  05/2007   Left  . RHINOPLASTY  1969  . TONSILLECTOMY     @ 72 y.o.  . TOTAL ANKLE ARTHROPLASTY  10/30/2011   Procedure: TOTAL ANKLE ARTHOPLASTY;  Surgeon: Wylene Simmer, MD;  Location: [place];  Service: Orthopedics;  Laterality: Right;  RIGHT TOTAL ANKLE REPLACEMENT, POSSIBLE GASTROC RESESSION  . TOTAL KNEE  ARTHROPLASTY Right 10/30/2017   Procedure: RIGHT TOTAL KNEE ARTHROPLASTY;  Surgeon: Netta Cedars, MD;  Location: [place];  Service: Orthopedics;  Laterality: Right;  . TUBAL LIGATION  1980    There were no vitals filed for this visit.  Subjective Assessment - 11/18/17 1305    Subjective  Pt stated she has stiffness in her ankle and Rt knee, current pain scale 4-5/10 today.  Continues to elevate and ice knee several times a day.      Pertinent History  TKA on 10/30/17    Patient Stated Goals  rehab the knee where she can bend and straighten it and walk wihtout assistance    Currently in Pain?  Yes    Pain Score  5     Pain Location  Knee    Pain Orientation  Right    Pain Descriptors / Indicators  Aching;Sore    Pain Type  Surgical pain    Pain Onset  1 to 4 weeks ago    Pain Frequency  Constant    Aggravating Factors   walking, WB, or being still for too long    Pain Relieving Factors  rest, ice, elevation, medicine    Effect of Pain on Daily Activities  increases  [facility] PT Assessment - 11/18/17 0001      Assessment   Medical Diagnosis  R TKA    Referring Provider (PT)  Netta Cedars, MD    Onset Date/Surgical Date  10/30/17    Hand Dominance  Left    Next MD Visit  12/09/2017    Prior Therapy  OPPT for R arthroscopic surgery prior to TKA                   [facility] Adult PT Treatment/Exercise - 11/18/17 0001      Exercises   Exercises  Knee/Hip      Knee/Hip Exercises: Stretches   Passive Hamstring Stretch  Right;3 reps;30 seconds    Passive Hamstring Stretch Limitations  standing, 12" step    Quad Stretch  Right;3 reps;30 seconds    Quad Stretch Limitations  prone with rope    Knee: Self-Stretch to increase Flexion  Right;10 seconds    Knee: Self-Stretch Limitations  x10 reps, standing, 12" step    Gastroc Stretch  Both;3 reps;30 seconds    Gastroc Stretch Limitations  slant board      Knee/Hip Exercises: Aerobic   Stationary Bike  x4 mins, rocking  for mobility, seat 8      Knee/Hip Exercises: Standing   Heel Raises  Both;15 reps    Heel Raises Limitations  heel and toe slope    Terminal Knee Extension  Right;5 reps    Terminal Knee Extension Limitations  5" holds, RTB    Rocker Board  2 minutes    Rocker Board Limitations  R/L; Df/PF      Knee/Hip Exercises: Supine   Short Arc [employer]  Right;20 reps    Short Arc Quad Sets Limitations  5" holds    Knee Extension  AROM    Knee Extension Limitations  7    Knee Flexion  AROM    Knee Flexion Limitations  108   was 100 last session     Knee/Hip Exercises: Prone   Contract/Relax to Increase Flexion  3x 10" holds as tolerated    Other Prone Exercises  TKE 10x 3" holds      Manual Therapy   Manual Therapy  Myofascial release    Manual therapy comments  completed separate rest of session    Myofascial Release  to decrease scar tissue and adhesions               PT Short Term Goals - 11/02/17 1546      PT SHORT TERM GOAL #1   Title  Pt will have improved R knee AROM from 5-90deg in order to decrease pain and maximize gait.    Time  3    Period  Weeks    Status  New    Target Date  11/23/17      PT SHORT TERM GOAL #2   Title  Pt will have decreased edema by 2cm or > in order to maximize R knee AROM.     Time  3    Period  Weeks    Status  New      PT SHORT TERM GOAL #3   Title  Pt will have 1/2 grade improvement in MMT throughout in order to maximize balance and functional mobility.    Time  3    Period  Weeks    Status  New        PT Long Term Goals - 11/02/17 1547  PT LONG TERM GOAL #1   Title  Pt will have improved R knee AROM from 0-125deg in order to further maximize gait and stair ambulation as well as floor transfers.    Time  6    Period  Weeks    Status  New    Target Date  12/14/17      PT LONG TERM GOAL #2   Title  Pt will have 1 grade improvement in MMT throughout in order to further maximize gait, stair ambulation, and floor  transfers.    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    Time  6    Period  Weeks    Status  New      PT LONG TERM GOAL #5   Title  Pt will have improved 5xSTS to 12 sec or < with proper form in order to demo improved functional strength and maximize her functional mobility.    Time  6    Period  Weeks    Status  New            Plan - 11/18/17 1337    Clinical Impression Statement  Continued with established POC to address Rt knee mobility.  Noted decreased ankle mobility and reports of nerve pain affecting her gait, normal activities and hard to sleep.  Added contract relax and prone TKE to address mobility deficits.  Continued with myofascial techqniues to address adhesions along distal portion of scar.  Improved AROM at EOS ROM 7-108 degrees (was 7-100 degress last session.)    Rehab Potential  Good    PT Frequency  3x / week    PT Duration  6 weeks    PT Treatment/Interventions  ADLs/Self Care Home Management;Aquatic Therapy;Cryotherapy;Electrical Stimulation;Moist Heat;Ultrasound;DME Instruction;Gait training;Stair training;Functional mobility training;Therapeutic activities;Therapeutic exercise;Balance training;Neuromuscular re-education;Patient/family education;Orthotic Fit/Training;Manual techniques;Manual lymph drainage;Scar mobilization;Dry needling;Passive range of motion;Energy conservation;Taping;Spinal Manipulations;Joint Manipulations    PT Next Visit Plan  Continue focus on R knee AROM and manual to reduce adhesions. Address floor transfers once  ROM normalized and strength starts improving    PT Home Exercise Plan  eval: quad sets, heel slides, supine HS stretch; 10/2: bone foam for extension       Patient will benefit from skilled therapeutic intervention in order to improve the following deficits and impairments:  Abnormal gait, Decreased activity tolerance, Decreased balance, Decreased endurance, Decreased mobility, Decreased range of motion, Decreased scar mobility, Decreased strength, Difficulty walking, Hypomobility, Increased edema, Increased fascial restricitons, Impaired flexibility, Improper body mechanics, Pain  Visit Diagnosis: Acute pain of right knee  Muscle weakness (generalized)  Difficulty in walking, not elsewhere classified  Localized edema     Problem List Patient Active Problem List   Diagnosis Date Noted  . Status post total knee replacement, right 10/30/2017  . Obesity (BMI 30.0-34.9) 04/30/2017  . Map-dot-fingerprint corneal  dystrophy 08/21/2014  . Vaginal dryness, menopausal 08/21/2014  . Osteopenia 12/14/2013  . Essential hypertension 12/14/2013  . Complex regional pain syndrome of lower limb 10/04/2012  . Hyperlipidemia 10/04/2012  . Allergic rhinitis 10/04/2012  . Generalized osteoarthrosis, involving multiple sites 10/04/2012   Ihor Austin, [place]; [place]  Aldona Lento 11/18/2017, 2:06 PM  [facility] [address] [place], [place], [address] Phone: [phone]   Fax:  [phone]  Name: SONNIA STRONG MRN: [id number] Date of Birth: 1946/01/15

## 2017-11-20 ENCOUNTER — Ambulatory Visit (HOSPITAL_COMMUNITY): Payer: Medicare Other

## 2017-11-20 ENCOUNTER — Encounter (HOSPITAL_COMMUNITY): Payer: Self-pay

## 2017-11-20 DIAGNOSIS — R600 Localized edema: Secondary | ICD-10-CM

## 2017-11-20 DIAGNOSIS — R262 Difficulty in walking, not elsewhere classified: Secondary | ICD-10-CM

## 2017-11-20 DIAGNOSIS — M25561 Pain in right knee: Secondary | ICD-10-CM

## 2017-11-20 DIAGNOSIS — M25661 Stiffness of right knee, not elsewhere classified: Secondary | ICD-10-CM | POA: Diagnosis not present

## 2017-11-20 DIAGNOSIS — M6281 Muscle weakness (generalized): Secondary | ICD-10-CM | POA: Diagnosis not present

## 2017-11-20 NOTE — Therapy (Signed)
[place], [place], [address] Phone: [phone]   Fax:  [phone]  Physical Therapy Treatment  Patient Details  Name: [name] MRN: [id number] Date of Birth: 01/30/1946 Referring Provider (PT): Netta Cedars, MD   Encounter Date: 11/20/2017  PT End of Session - 11/20/17 1258    Visit Number  9    Number of Visits  19    Date for PT Re-Evaluation  12/14/17   Minireassess 11/23/17   Authorization Type  Medicare (Secondary: [employer])    Authorization Time Period  11/02/17 to 12/14/17    Authorization - Visit Number  23    Authorization - Number of Visits  60    PT Start Time  2353    PT Stop Time  1340    PT Time Calculation (min)  41 min    Activity Tolerance  Patient tolerated treatment well    Behavior During Therapy  [facility] for tasks assessed/performed       Past Medical History:  Diagnosis Date  . Allergy   . Arthritis   . Contact dermatitis and other eczema, due to unspecified cause   . Depression   . Encounter for long-term (current) use of other medications   . Hyperlipidemia   . Other and unspecified hyperlipidemia   . Pain in joint, ankle and foot   . Screening for malignant neoplasm of the cervix     Past Surgical History:  Procedure Laterality Date  . ANKLE SURGERY  05/2006   Right Ankle  . Carpel TUnnel Release  [id number]   [id number]), Left(2006)  . CATARACT EXTRACTION Left 10/03/14  . CATARACT EXTRACTION Right 11/16/2014  . COLONOSCOPY    . DIAGNOSTIC LAPAROSCOPY  1980  . JOINT REPLACEMENT     Total Knee(L)-11/05/07  . KNEE ARTHROPLASTY  05/2007   Left  . RHINOPLASTY  1969  . TONSILLECTOMY     @ 72 y.o.  . TOTAL ANKLE ARTHROPLASTY  10/30/2011   Procedure: TOTAL ANKLE ARTHOPLASTY;  Surgeon: Wylene Simmer, MD;  Location: [place];  Service: Orthopedics;  Laterality: Right;  RIGHT TOTAL ANKLE REPLACEMENT, POSSIBLE GASTROC RESESSION  . TOTAL KNEE ARTHROPLASTY Right 10/30/2017   Procedure:  RIGHT TOTAL KNEE ARTHROPLASTY;  Surgeon: Netta Cedars, MD;  Location: [place];  Service: Orthopedics;  Laterality: Right;  . TUBAL LIGATION  1980    There were no vitals filed for this visit.  Subjective Assessment - 11/20/17 1303    Subjective  Pt reports that her R ankle pain is much better since being on her gabapentin. Constant, achy, R knee pain.     Pertinent History  TKA on 10/30/17    Patient Stated Goals  rehab the knee where she can bend and straighten it and walk wihtout assistance    Currently in Pain?  Yes    Pain Score  4     Pain Location  Knee    Pain Orientation  Right    Pain Descriptors / Indicators  Aching;Constant    Pain Type  Surgical pain    Pain Onset  1 to 4 weeks ago    Pain Frequency  Constant    Aggravating Factors   walking, WB, or being still for too long    Pain Relieving Factors  rest, ice, elevation, medicine    Effect of Pain on Daily Activities  increases             [facility] Adult PT Treatment/Exercise - 11/20/17  0001      Exercises   Exercises  Knee/Hip      Knee/Hip Exercises: Stretches   Passive Hamstring Stretch  Right;3 reps;30 seconds    Passive Hamstring Stretch Limitations  standing, 12" step    Quad Stretch  Right;3 reps;30 seconds    Quad Stretch Limitations  prone with rope    Knee: Self-Stretch to increase Flexion  Right;10 seconds    Knee: Self-Stretch Limitations  x10 reps, standing, 12" step    Gastroc Stretch  Both;3 reps;30 seconds    Gastroc Stretch Limitations  slant board      Knee/Hip Exercises: Aerobic   Stationary Bike  x4 mins, rocking and fwd for mobility, seat 8      Knee/Hip Exercises: Standing   Terminal Knee Extension  Right;10 reps    Terminal Knee Extension Limitations  10" holds, RTB    Lateral Step Up  Right;10 reps;Step Height: 4";Hand Hold: 2      Knee/Hip Exercises: Seated   Long Arc Quad  Right;15 reps    Long [employer] Limitations  2-3" holds      Knee/Hip Exercises: Supine   Short Arc [employer]  Right;20 reps    Short Arc [employer] Limitations  3-5" holds      Knee/Hip Exercises: Prone   Contract/Relax to Increase Flexion  3x 10" holds as tolerated    Other Prone Exercises  TKE 10x 3" holds      Manual Therapy   Manual Therapy  Myofascial release;Joint mobilization    Manual therapy comments  completed separate rest of session    Joint Mobilization  patellar mobs all directions for improved mobility; Grade II-III AP and PA tibiofemoral joint mobs for pain control and improved joint mobility    Myofascial Release  to decrease scar tissue and adhesions, proximal medial tibia, distal VLO             PT Education - 11/20/17 1259    Education Details  natural progression of rehab/recovery; continue HEP    Person(s) Educated  Patient    Methods  Explanation;Demonstration    Comprehension  Verbalized understanding;Returned demonstration       PT Short Term Goals - 11/02/17 1546      PT SHORT TERM GOAL #1   Title  Pt will have improved R knee AROM from 5-90deg in order to decrease pain and maximize gait.    Time  3    Period  Weeks    Status  New    Target Date  11/23/17      PT SHORT TERM GOAL #2   Title  Pt will have decreased edema by 2cm or > in order to maximize R knee AROM.     Time  3    Period  Weeks    Status  New      PT SHORT TERM GOAL #3   Title  Pt will have 1/2 grade improvement in MMT throughout in order to maximize balance and functional mobility.    Time  3    Period  Weeks    Status  New        PT Long Term Goals - 11/02/17 1547      PT LONG TERM GOAL #1   Title  Pt will have improved R knee AROM from 0-125deg in order to further maximize gait and stair ambulation as well as floor transfers.    Time  6    Period  Weeks  Status  New    Target Date  12/14/17      PT LONG TERM GOAL #2   Title  Pt will have 1 grade improvement in MMT throughout in order to further maximize gait, stair ambulation, and floor transfers.    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    Time  6    Period  Weeks    Status  New      PT LONG TERM GOAL #5   Title  Pt will have improved 5xSTS to 12 sec or < with proper form in order to demo improved functional strength and maximize her functional mobility.    Time  6    Period  Weeks    Status  New            Plan - 11/20/17 1341    Clinical Impression Statement  Pt reports her R ankle pain has improved since starting her gabapentin. Continued with established POC focusing on R knee mobility. Continued with TKE and added lateral step ups for quad strength to improve knee extension. Continued with prone contract relax for knee flexion and TKE for extension. Ended with MFR and joint mobs for mobility. AROM 6 to106deg this date.     Rehab Potential  Good    PT Frequency  3x / week    PT Duration  6 weeks    PT Treatment/Interventions  ADLs/Self Care Home Management;Aquatic Therapy;Cryotherapy;Electrical Stimulation;Moist Heat;Ultrasound;DME Instruction;Gait training;Stair training;Functional mobility training;Therapeutic activities;Therapeutic exercise;Balance training;Neuromuscular re-education;Patient/family education;Orthotic Fit/Training;Manual techniques;Manual lymph drainage;Scar mobilization;Dry needling;Passive range of motion;Energy conservation;Taping;Spinal Manipulations;Joint Manipulations    PT Next Visit Plan  Continue focus on R knee AROM and manual to reduce adhesions. Address floor transfers once ROM normalized and strength starts improving    PT Home  Exercise Plan  eval: quad sets, heel slides, supine HS stretch; 10/2: bone foam for extension    Consulted and Agree with Plan of Care  Patient       Patient will benefit from skilled therapeutic intervention in order to improve the following deficits and impairments:  Abnormal gait, Decreased activity tolerance, Decreased balance, Decreased endurance, Decreased mobility, Decreased range of motion, Decreased scar mobility, Decreased strength, Difficulty walking, Hypomobility, Increased edema, Increased fascial restricitons, Impaired flexibility, Improper body mechanics, Pain  Visit Diagnosis: Acute pain of right knee  Muscle weakness (generalized)  Difficulty in walking, not elsewhere classified  Localized edema     Problem List Patient Active Problem List   Diagnosis Date Noted  . Status post total knee replacement, right 10/30/2017  . Obesity (BMI 30.0-34.9) 04/30/2017  . 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 Vaginal dryness, menopausal 08/21/2014  . Osteopenia 12/14/2013  . Essential hypertension 12/14/2013  . Complex regional pain syndrome of lower limb 10/04/2012  . Hyperlipidemia 10/04/2012  . Allergic rhinitis 10/04/2012  .  Generalized osteoarthrosis, involving multiple sites 10/04/2012       Geraldine Solar PT, DPT  [place] [address] [place], [place], [address] Phone: [phone]   Fax:  [phone]  Name: LAJOYCE TAMURA MRN: [id number] Date of Birth: 03-04-1945

## 2017-11-23 ENCOUNTER — Ambulatory Visit (HOSPITAL_COMMUNITY): Payer: Medicare Other

## 2017-11-23 ENCOUNTER — Encounter (HOSPITAL_COMMUNITY): Payer: Self-pay

## 2017-11-23 DIAGNOSIS — R600 Localized edema: Secondary | ICD-10-CM

## 2017-11-23 DIAGNOSIS — M25561 Pain in right knee: Secondary | ICD-10-CM

## 2017-11-23 DIAGNOSIS — M6281 Muscle weakness (generalized): Secondary | ICD-10-CM

## 2017-11-23 DIAGNOSIS — R262 Difficulty in walking, not elsewhere classified: Secondary | ICD-10-CM

## 2017-11-23 DIAGNOSIS — M25661 Stiffness of right knee, not elsewhere classified: Secondary | ICD-10-CM | POA: Diagnosis not present

## 2017-11-23 NOTE — Therapy (Addendum)
[place], [place], [address] Phone: [phone]   Fax:  [phone]   Progress Note Reporting Period 11/02/17 to 11/23/17  See note below for Objective Data and Assessment of Progress/Goals.    Physical Therapy Treatment  Patient Details  Name: [name] MRN: [id number] Date of Birth: [birth year] Referring Provider (PT): Netta Cedars, MD   Encounter Date: 11/23/2017  PT End of Session - 11/23/17 1303    Visit Number  10    Number of Visits  19    Date for PT Re-Evaluation  12/14/17   Minireassess 11/23/17   Authorization Type  Medicare (Secondary: [employer])    Authorization Time Period  11/02/17 to 12/14/17    Authorization - Visit Number  24    Authorization - Number of Visits  60    PT Start Time  3646    PT Stop Time  1343    PT Time Calculation (min)  44 min    Activity Tolerance  Patient tolerated treatment well    Behavior During Therapy  [facility] for tasks assessed/performed       Past Medical History:  Diagnosis Date  . Allergy   . Arthritis   . Contact dermatitis and other eczema, due to unspecified cause   . Depression   . Encounter for long-term (current) use of other medications   . Hyperlipidemia   . Other and unspecified hyperlipidemia   . Pain in joint, ankle and foot   . Screening for malignant neoplasm of the cervix     Past Surgical History:  Procedure Laterality Date  . ANKLE SURGERY  05/2006   Right Ankle  . Carpel TUnnel Release  [id number]   [id number]), Left(2006)  . CATARACT EXTRACTION Left 10/03/14  . CATARACT EXTRACTION Right 11/16/2014  . COLONOSCOPY    . DIAGNOSTIC LAPAROSCOPY  1980  . JOINT REPLACEMENT     Total Knee(L)-11/05/07  . KNEE ARTHROPLASTY  05/2007   Left  . RHINOPLASTY  1969  . TONSILLECTOMY     @ 72 y.o.  . TOTAL ANKLE ARTHROPLASTY  10/30/2011   Procedure: TOTAL ANKLE ARTHOPLASTY;  Surgeon: Wylene Simmer, MD;  Location: [place];  Service: Orthopedics;   Laterality: Right;  RIGHT TOTAL ANKLE REPLACEMENT, POSSIBLE GASTROC RESESSION  . TOTAL KNEE ARTHROPLASTY Right 10/30/2017   Procedure: RIGHT TOTAL KNEE ARTHROPLASTY;  Surgeon: Netta Cedars, MD;  Location: [place];  Service: Orthopedics;  Laterality: Right;  . TUBAL LIGATION  1980    There were no vitals filed for this visit.  Subjective Assessment - 11/23/17 1303    Subjective  Pt states that she is still having pain at medial knee and she's not sure why. She feels this is keeping her from improving like she wants to.     Pertinent History  TKA on 10/30/17    Patient Stated Goals  rehab the knee where she can bend and straighten it and walk wihtout assistance    Currently in Pain?  Yes    Pain Score  3     Pain Location  Knee    Pain Orientation  Right    Pain Descriptors / Indicators  Aching;Constant    Pain Type  Surgical pain    Pain Onset  1 to 4 weeks ago    Pain Frequency  Constant    Aggravating Factors   walking, WB, or being still for too long    Pain Relieving Factors  rest, ice, elevation,  medicine    Effect of Pain on Daily Activities  increases         [facility] PT Assessment - 11/23/17 0001      Assessment   Medical Diagnosis  R TKA    Referring Provider (PT)  Netta Cedars, MD    Onset Date/Surgical Date  10/30/17    Hand Dominance  Left    Next MD Visit  12/09/2017    Prior Therapy  [facility] for R arthroscopic surgery prior to TKA      Circumferential Edema   Circumferential - Right  34.5cm joint line   was 38cm, joint line     AROM   Right Knee Extension  6   was 10   Right Knee Flexion  108   was 60     Strength   Right Hip Flexion  4+/5   was 3+   Right Hip Extension  3+/5   was 3+   Right Hip ABduction  4-/5   was 3+   Left Hip Extension  4/5   was 4-   Left Hip ABduction  4/5   was 4   Right Knee Flexion  4+/5   was 3+   Right Knee Extension  4+/5   was 3+   Right Ankle Dorsiflexion  4+/5   was 4+   Left Ankle Dorsiflexion  5/5   was 4+      Ambulation/Gait   Ambulation Distance (Feet)  394 Feet   3MWT; was 330f with RW   Assistive device  Straight cane    Gait Pattern  Step-through pattern;Decreased stance time - left;Decreased hip/knee flexion - right;Decreased dorsiflexion - right;Antalgic;Trendelenburg   decreased R heel strike     Balance   Balance Assessed  Yes      Static Standing Balance   Static Standing - Balance Support  No upper extremity supported    Static Standing Balance -  Activities   Single Leg Stance - Right Leg;Single Leg Stance - Left Leg    Static Standing - Comment/# of Minutes  R: 6 sec L: 12.5sec   was 3 sec or < RLE, 9.8sec on L     Standardized Balance Assessment   Standardized Balance Assessment  Five Times Sit to Stand    Five times sit to stand comments   13.2sec, weight shifted off RLE, RLE extended, no UE   was 23.7sec, RLE extended          [facility] - 11/23/17 0001      Exercises   Exercises  Knee/Hip      Knee/Hip Exercises: Stretches   Quad Stretch  Right;3 reps;30 seconds    Quad Stretch Limitations  prone with rope      Knee/Hip Exercises: Aerobic   Stationary Bike  x4 mins, rocking and fwd for mobility, seat 8      Manual Therapy   Manual Therapy  Soft tissue mobilization    Manual therapy comments  completed separate rest of session    Soft tissue mobilization  to medial/lateral portion of patellar tendon to decrease pain and restrictions           PT Education - 11/23/17 1303    Education Details  reassessment findings, continue HEP    Person(s) Educated  Patient    Methods  Explanation    Comprehension  Verbalized understanding       PT Short Term Goals - 11/23/17 1305      PT SHORT  TERM GOAL #1   Title  Pt will have improved R knee AROM from 5-90deg in order to decrease pain and maximize gait.    Baseline  10/14: 8-107deg    Time  3    Period  Weeks    Status  Partially Met      PT SHORT TERM GOAL #2   Title  Pt will  have decreased edema by 2cm or > in order to maximize R knee AROM.     Time  3    Period  Weeks    Status  Achieved      PT SHORT TERM GOAL #3   Title  Pt will have 1/2 grade improvement in MMT throughout in order to maximize balance and functional mobility.    Baseline  10/14: see MMT    Time  3    Period  Weeks    Status  Partially Met        PT Long Term Goals - 11/23/17 1305      PT LONG TERM GOAL #1   Title  Pt will have improved R knee AROM from 0-125deg in order to further maximize gait and stair ambulation as well as floor transfers.    Baseline  10/14: 8-107deg    Time  6    Period  Weeks    Status  On-going      PT LONG TERM GOAL #2   Title  Pt will have 1 grade improvement in MMT throughout in order to further maximize gait, stair ambulation, and floor transfers.    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    Baseline  10/14: R: 6 sec, L: 12sec    Time  6    Period  Weeks    Status  On-going      PT LONG TERM GOAL #5   Title  Pt will have improved 5xSTS to 12 sec or < with proper form in order to demo improved functional strength and maximize her functional mobility.    Baseline  10/14: 13sec RLE, extended    Time  6    Period  Weeks    Status  On-going            Plan - 11/23/17 1346    Clinical Impression Statement  PT reassessed pt's goals and outcome measures this date. Pt has made great progress towards goals as  illustrated above. Her swelling has greatly improved and her overall strength has improved as well, though limitations remain. Her gait has improved as she is ambulating with SPC but her gait mechanics are still deficient and her SLS has improved slightly bilaterally. Pt's AROM 6-108deg by EOS. Pt's main complaint is sharp  pain at proximal tibia, typically laterally. During palpation, pain noted to be located at edge of patellar tendon. PT educated pt about anatomy, healing progressing, and then pt stated that she has been averaging 5,000 steps on top of two 30-min bike sessions and other ROM exercises at home; PT educated pt that the high activity level is great, however, not if she is in as much pain as she is. PT asked pt to decrease her bike riding to 1x/day and to decrease her amount of steps to assess her response and to allow her R knee time to recover and she verbalized understanding. Continue as planned, progressing as able.     Rehab Potential  Good    PT Frequency  3x / week    PT Duration  6 weeks    PT Treatment/Interventions  ADLs/Self Care Home Management;Aquatic Therapy;Cryotherapy;Electrical Stimulation;Moist Heat;Ultrasound;DME Instruction;Gait training;Stair training;Functional mobility training;Therapeutic activities;Therapeutic exercise;Balance training;Neuromuscular re-education;Patient/family education;Orthotic Fit/Training;Manual techniques;Manual lymph drainage;Scar mobilization;Dry needling;Passive range of motion;Energy conservation;Taping;Spinal Manipulations;Joint Manipulations    PT Next Visit Plan  Continue focus on R knee AROM and manual to reduce adhesions at patellar tendon; Address floor transfers once ROM normalized and strength starts improving    PT Home Exercise Plan  eval: quad sets, heel slides, supine HS stretch; 10/2: bone foam for extension    Consulted and Agree with Plan of Care  Patient       Patient will benefit from skilled therapeutic intervention in order  to improve the following deficits and impairments:  Abnormal gait, Decreased activity tolerance, Decreased balance, Decreased endurance, Decreased mobility, Decreased range of motion, Decreased scar mobility, Decreased strength, Difficulty walking, Hypomobility, Increased edema, Increased fascial restricitons, Impaired flexibility, Improper body mechanics, Pain  Visit Diagnosis: Acute pain of right knee  Muscle weakness (generalized)  Difficulty in walking, not elsewhere classified  Localized edema     Problem List Patient Active Problem List   Diagnosis Date Noted  . Status post total knee replacement, right 10/30/2017  . Obesity (BMI 30.0-34.9) 04/30/2017  . 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 Vaginal dryness, menopausal 08/21/2014  . Osteopenia 12/14/2013  . Essential hypertension 12/14/2013  . Complex regional pain syndrome of lower limb 10/04/2012  . Hyperlipidemia 10/04/2012  . Allergic rhinitis 10/04/2012  . Generalized osteoarthrosis, involving multiple sites 10/04/2012      Geraldine Solar PT, DPT  [place] [address] [place], [place], [address] Phone: [phone]   Fax:  [phone]  Name: [name] MRN: [id number] Date of Birth: 06-10-1945

## 2017-11-25 ENCOUNTER — Ambulatory Visit (HOSPITAL_COMMUNITY): Payer: Medicare Other

## 2017-11-25 ENCOUNTER — Encounter (HOSPITAL_COMMUNITY): Payer: Self-pay

## 2017-11-25 DIAGNOSIS — M25661 Stiffness of right knee, not elsewhere classified: Secondary | ICD-10-CM | POA: Diagnosis not present

## 2017-11-25 DIAGNOSIS — M25561 Pain in right knee: Secondary | ICD-10-CM

## 2017-11-25 DIAGNOSIS — R262 Difficulty in walking, not elsewhere classified: Secondary | ICD-10-CM

## 2017-11-25 DIAGNOSIS — M6281 Muscle weakness (generalized): Secondary | ICD-10-CM | POA: Diagnosis not present

## 2017-11-25 DIAGNOSIS — R600 Localized edema: Secondary | ICD-10-CM

## 2017-11-25 NOTE — Therapy (Signed)
[place], [place], [address] Phone: [phone]   Fax:  [phone]  Physical Therapy Treatment  Patient Details  Name: [name] MRN: [id number] Date of Birth: Nov 07, 1945 Referring Provider (PT): Netta Cedars, MD   Encounter Date: 11/25/2017  PT End of Session - 11/25/17 1306    Visit Number  11    Number of Visits  19    Date for PT Re-Evaluation  12/14/17   MInireassess complete 11/23/17   Authorization Type  Medicare (Secondary: [employer])    Authorization Time Period  11/02/17 to 12/14/17    Authorization - Visit Number  25    Authorization - Number of Visits  60    PT Start Time  1301   4' on bike, not included wiht charges   PT Stop Time  1345    PT Time Calculation (min)  44 min    Activity Tolerance  Patient tolerated treatment well    Behavior During Therapy  [facility] for tasks assessed/performed       Past Medical History:  Diagnosis Date  . Allergy   . Arthritis   . Contact dermatitis and other eczema, due to unspecified cause   . Depression   . Encounter for long-term (current) use of other medications   . Hyperlipidemia   . Other and unspecified hyperlipidemia   . Pain in joint, ankle and foot   . Screening for malignant neoplasm of the cervix     Past Surgical History:  Procedure Laterality Date  . ANKLE SURGERY  05/2006   Right Ankle  . Carpel TUnnel Release  [id number]   [id number]), Left(2006)  . CATARACT EXTRACTION Left 10/03/14  . CATARACT EXTRACTION Right 11/16/2014  . COLONOSCOPY    . DIAGNOSTIC LAPAROSCOPY  1980  . JOINT REPLACEMENT     Total Knee(L)-11/05/07  . KNEE ARTHROPLASTY  05/2007   Left  . RHINOPLASTY  1969  . TONSILLECTOMY     @ 72 y.o.  . TOTAL ANKLE ARTHROPLASTY  10/30/2011   Procedure: TOTAL ANKLE ARTHOPLASTY;  Surgeon: Wylene Simmer, MD;  Location: [place];  Service: Orthopedics;  Laterality: Right;  RIGHT TOTAL ANKLE REPLACEMENT, POSSIBLE GASTROC RESESSION  .  TOTAL KNEE ARTHROPLASTY Right 10/30/2017   Procedure: RIGHT TOTAL KNEE ARTHROPLASTY;  Surgeon: Netta Cedars, MD;  Location: [place];  Service: Orthopedics;  Laterality: Right;  . TUBAL LIGATION  1980    There were no vitals filed for this visit.  Subjective Assessment - 11/25/17 1304    Subjective  Pt stated she has reduced standing activiites following strain/sharp pain on anterior portion of knee on Monday.  Reports minimal pain today.      Pertinent History  TKA on 10/30/17    Patient Stated Goals  rehab the knee where she can bend and straighten it and walk wihtout assistance    Currently in Pain?  Yes    Pain Score  2     Pain Location  Knee    Pain Orientation  Right    Pain Descriptors / Indicators  Aching;Sore    Pain Type  Surgical pain    Pain Onset  1 to 4 weeks ago    Pain Frequency  Constant    Aggravating Factors   walking, WB or being still for too long    Pain Relieving Factors  rest, ice, elevation, medicine    Effect of Pain on Daily Activities  increases  [place] [facility] PT Treatment/Exercise - 11/25/17 0001      Exercises   Exercises  Knee/Hip      Knee/Hip Exercises: Stretches   Passive Hamstring Stretch  Right;3 reps;30 seconds    Passive Hamstring Stretch Limitations  standing, 12" step    Quad Stretch  Right;3 reps;30 seconds    Quad Stretch Limitations  prone with rope    Knee: Self-Stretch to increase Flexion  Right;10 seconds    Knee: Self-Stretch Limitations  x10 reps, standing, 12" step      Knee/Hip Exercises: Aerobic   Stationary Bike  x4 mins, rocking and fwd full revolution for mobility, seat 8      Knee/Hip Exercises: Standing   Terminal Knee Extension  Right;15 reps    Terminal Knee Extension Limitations  10" holds, GTB    Lateral Step Up  Right;10 reps;Step Height: 4";Hand Hold: 2    Lateral Step Up Limitations  c/o sharp pain with exercise    Gait Training  265f with cueing to equalize stance phase       Knee/Hip Exercises: Supine   Terminal Knee Extension  AROM;Right;15 reps    Terminal Knee Extension Limitations  1/2 bolster under knee    Knee Extension  AROM    Knee Flexion  AROM      Knee/Hip Exercises: Prone   Hip Extension  Strengthening;Right;1 set;10 reps    Contract/Relax to Increase Flexion  3x 10" holds as tolerated    Other Prone Exercises  TKE 10x 3" holds      Manual Therapy   Manual Therapy  Soft tissue mobilization    Manual therapy comments  completed separate rest of session    Joint Mobilization  patellar mobs all directions for improved mobility; Grade II-III AP and PA tibiofemoral joint mobs for pain control and improved joint mobility    Soft tissue mobilization  to medial/lateral portion of patellar tendon to decrease pain and restrictions               PT Short Term Goals - 11/23/17 1305      PT SHORT TERM GOAL #1   Title  Pt will have improved R knee AROM from 5-90deg in order to decrease pain and maximize gait.    Baseline  10/14: 8-107deg    Time  3    Period  Weeks    Status  Partially Met      PT SHORT TERM GOAL #2   Title  Pt will have decreased edema by 2cm or > in order to maximize R knee AROM.     Time  3    Period  Weeks    Status  Achieved      PT SHORT TERM GOAL #3   Title  Pt will have 1/2 grade improvement in MMT throughout in order to maximize balance and functional mobility.    Baseline  10/14: see MMT    Time  3    Period  Weeks    Status  Partially Met        PT Long Term Goals - 11/23/17 1305      PT LONG TERM GOAL #1   Title  Pt will have improved R knee AROM from 0-125deg in order to further maximize gait and stair ambulation as well as floor transfers.    Baseline  10/14: 8-107deg    Time  6    Period  Weeks    Status  On-going  PT LONG TERM GOAL #2   Title  Pt will have 1 grade improvement in MMT throughout in order to further maximize gait, stair ambulation, and floor transfers.    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    Baseline  10/14: R: 6 sec, L: 12sec    Time  6    Period  Weeks    Status  On-going      PT LONG TERM GOAL #5   Title  Pt will have improved 5xSTS to 12 sec or < with proper form in order to demo improved functional strength and maximize her functional mobility.    Baseline  10/14: 13sec RLE, extended    Time  6    Period  Weeks    Status  On-going            Plan - 11/25/17 1348    Clinical Impression Statement  Continued session focus with knee mobility and functional strengthening.  Pt entered dept ambulating with antalgic gait mechanics with cueing to improve stance phase.  Resumed rockerboard to equalize weight bearing to improve gait.  Continued ROM based exercises and manual technqiues to address myofascial restricitons and improve patella mobility.  Pt making great gains wiht improved range to 5-115 degrees (was 6-108 deg last session).  Pt able to tolerate well towards session with no reports of pain except for c/o sharp pain wiht lateral steps ups.      Rehab Potential  Good    PT Frequency  3x / week    PT Duration  6 weeks    PT Treatment/Interventions  ADLs/Self Care Home Management;Aquatic Therapy;Cryotherapy;Electrical Stimulation;Moist Heat;Ultrasound;DME Instruction;Gait training;Stair training;Functional mobility training;Therapeutic activities;Therapeutic exercise;Balance training;Neuromuscular  re-education;Patient/family education;Orthotic Fit/Training;Manual techniques;Manual lymph drainage;Scar mobilization;Dry needling;Passive range of motion;Energy conservation;Taping;Spinal Manipulations;Joint Manipulations    PT Next Visit Plan  Continue focus on R knee AROM and manual to reduce adhesions at patellar tendon; Address floor transfers once ROM normalized and strength starts improving    PT Home Exercise Plan  eval: quad sets, heel slides, supine HS stretch; 10/2: bone foam for extension       Patient will benefit from skilled therapeutic intervention in order to improve the following deficits and impairments:  Abnormal gait, Decreased activity tolerance, Decreased balance, Decreased endurance, Decreased mobility, Decreased range of motion, Decreased scar mobility, Decreased strength, Difficulty walking, Hypomobility, Increased edema, Increased fascial restricitons, Impaired flexibility, Improper body mechanics, Pain  Visit Diagnosis: Acute pain of right knee  Muscle weakness (generalized)  Difficulty in walking, not elsewhere classified  Localized edema     Problem List Patient Active Problem List   Diagnosis Date Noted  . Status post total knee replacement, right 10/30/2017  .  Obesity (BMI 30.0-34.9) 04/30/2017  . 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 Vaginal dryness, menopausal 08/21/2014  . Osteopenia 12/14/2013  . Essential hypertension 12/14/2013  . Complex regional pain syndrome of lower limb 10/04/2012  . Hyperlipidemia 10/04/2012  . Allergic rhinitis 10/04/2012  . Generalized osteoarthrosis, involving multiple sites 10/04/2012   Ihor Austin, [place]; [place]  Aldona Lento 11/25/2017, 1:57 PM  [place], [place], [address] Phone: [phone]   Fax:  [phone]  Name: AZALEAH USMAN MRN: [id number] Date of Birth: 08-12-1945

## 2017-11-27 ENCOUNTER — Ambulatory Visit (HOSPITAL_COMMUNITY): Payer: Medicare Other

## 2017-11-27 ENCOUNTER — Encounter (HOSPITAL_COMMUNITY): Payer: Self-pay

## 2017-11-27 DIAGNOSIS — M25661 Stiffness of right knee, not elsewhere classified: Secondary | ICD-10-CM | POA: Diagnosis not present

## 2017-11-27 DIAGNOSIS — R262 Difficulty in walking, not elsewhere classified: Secondary | ICD-10-CM

## 2017-11-27 DIAGNOSIS — M6281 Muscle weakness (generalized): Secondary | ICD-10-CM

## 2017-11-27 DIAGNOSIS — M25561 Pain in right knee: Secondary | ICD-10-CM

## 2017-11-27 DIAGNOSIS — R600 Localized edema: Secondary | ICD-10-CM

## 2017-11-27 NOTE — Therapy (Signed)
[place], [place], [address] Phone: [phone]   Fax:  [phone]  Physical Therapy Treatment  Patient Details  Name: [name] MRN: [id number] Date of Birth: 1945-08-24 Referring Provider (PT): Netta Cedars, MD   Encounter Date: 11/27/2017  PT End of Session - 11/27/17 1257    Visit Number  12    Number of Visits  19    Date for PT Re-Evaluation  12/14/17   MInireassess complete 11/23/17   Authorization Type  Medicare (Secondary: [employer])    Authorization Time Period  11/02/17 to 12/14/17    Authorization - Visit Number  5    Authorization - Number of Visits  60    PT Start Time  8675    PT Stop Time  4492    PT Time Calculation (min)  49 min    Activity Tolerance  Patient tolerated treatment well    Behavior During Therapy  [facility] for tasks assessed/performed       Past Medical History:  Diagnosis Date  . Allergy   . Arthritis   . Contact dermatitis and other eczema, due to unspecified cause   . Depression   . Encounter for long-term (current) use of other medications   . Hyperlipidemia   . Other and unspecified hyperlipidemia   . Pain in joint, ankle and foot   . Screening for malignant neoplasm of the cervix     Past Surgical History:  Procedure Laterality Date  . ANKLE SURGERY  05/2006   Right Ankle  . Carpel TUnnel Release  [id number]   [id number]), Left(2006)  . CATARACT EXTRACTION Left 10/03/14  . CATARACT EXTRACTION Right 11/16/2014  . COLONOSCOPY    . DIAGNOSTIC LAPAROSCOPY  1980  . JOINT REPLACEMENT     Total Knee(L)-11/05/07  . KNEE ARTHROPLASTY  05/2007   Left  . RHINOPLASTY  1969  . TONSILLECTOMY     @ 72 y.o.  . TOTAL ANKLE ARTHROPLASTY  10/30/2011   Procedure: TOTAL ANKLE ARTHOPLASTY;  Surgeon: Wylene Simmer, MD;  Location: [place];  Service: Orthopedics;  Laterality: Right;  RIGHT TOTAL ANKLE REPLACEMENT, POSSIBLE GASTROC RESESSION  . TOTAL KNEE ARTHROPLASTY Right 10/30/2017   Procedure: RIGHT TOTAL KNEE ARTHROPLASTY;  Surgeon: Netta Cedars, MD;  Location: [place];  Service: Orthopedics;  Laterality: Right;  . TUBAL LIGATION  1980    There were no vitals filed for this visit.  Subjective Assessment - 11/27/17 1258    Subjective  Pt reports that she is feeling much better. The sharp pain in her knee has significantly reduced, but she will still feel it rarely. She states that she was able to mow her yard yesterday without issue.    Pertinent History  TKA on 10/30/17    Patient Stated Goals  rehab the knee where she can bend and straighten it and walk wihtout assistance    Currently in Pain?  Yes    Pain Score  2     Pain Location  Knee    Pain Orientation  Right    Pain Descriptors / Indicators  Aching;Sore    Pain Type  Surgical pain    Pain Onset  1 to 4 weeks ago    Pain Frequency  Constant    Aggravating Factors   walking, WB, being on it too long    Pain Relieving Factors  rest, ice, elevations, medicine    Effect of Pain on Daily Activities  increases  [facility] Adult PT Treatment/Exercise - 11/27/17 0001      Exercises   Exercises  Knee/Hip      Knee/Hip Exercises: Stretches   Passive Hamstring Stretch  Right;3 reps;30 seconds    Passive Hamstring Stretch Limitations  standing, 12" step    Knee: Self-Stretch to increase Flexion  Right;10 seconds    Knee: Self-Stretch Limitations  x10 reps, standing, 12" step    Gastroc Stretch  Both;3 reps;30 seconds    Gastroc Stretch Limitations  slant board      Knee/Hip Exercises: Aerobic   Stationary Bike  x4 mins, rocking and fwd full revolution for mobility, seat 8      Knee/Hip Exercises: Standing   Hip Abduction  Both;10 reps    Abduction Limitations  RTB    Lateral Step Up  Right;10 reps;Step Height: 4";Hand Hold: 2    Lateral Step Up Limitations  c/o sharp pain with exercise    Forward Step Up  Right;10 reps;Step Height: 4";Hand Hold: 2    Functional Squat  10 reps    Functional Squat  Limitations  BUE support on // bars    SLS  BLE 5x15" holds (intermittent UE support on R due to R ankle)      Knee/Hip Exercises: Supine   Bridges  Both;20 reps    Straight Leg Raises  Right;15 reps    Knee Extension Limitations  5    Knee Flexion Limitations  113      Modalities   Modalities  Cryotherapy      Cryotherapy   Number Minutes Cryotherapy  5 Minutes   unbilled, at EOS   Cryotherapy Location  Knee    Type of Cryotherapy  Ice pack      Manual Therapy   Manual Therapy  Joint mobilization    Manual therapy comments  completed separate rest of session    Joint Mobilization  patellar mobs all directions for improved mobility; Grade II-III AP and PA tibiofemoral joint mobs for pain control and improved joint mobility    Soft tissue mobilization  VMO and at medial scar to decreased restrictions             PT Education - 11/27/17 1257    Education Details  exercise technique, continue HEP    Person(s) Educated  Patient    Methods  Explanation;Demonstration    Comprehension  Verbalized understanding;Returned demonstration       PT Short Term Goals - 11/23/17 1305      PT SHORT TERM GOAL #1   Title  Pt will have improved R knee AROM from 5-90deg in order to decrease pain and maximize gait.    Baseline  10/14: 8-107deg    Time  3    Period  Weeks    Status  Partially Met      PT SHORT TERM GOAL #2   Title  Pt will have decreased edema by 2cm or > in order to maximize R knee AROM.     Time  3    Period  Weeks    Status  Achieved      PT SHORT TERM GOAL #3   Title  Pt will have 1/2 grade improvement in MMT throughout in order to maximize balance and functional mobility.    Baseline  10/14: see MMT    Time  3    Period  Weeks    Status  Partially Met        PT Long Term Goals -  11/23/17 1305      PT LONG TERM GOAL #1   Title  Pt will have improved R knee AROM from 0-125deg in order to further maximize gait and stair ambulation as well as floor  transfers.    Baseline  10/14: 8-107deg    Time  6    Period  Weeks    Status  On-going      PT LONG TERM GOAL #2   Title  Pt will have 1 grade improvement in MMT throughout in order to further maximize gait, stair ambulation, and floor transfers.    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    Baseline  10/14: R: 6 sec, L: 12sec    Time  6    Period  Weeks    Status  On-going      PT LONG TERM GOAL #5   Title  Pt will have improved 5xSTS to 12 sec or < with proper form in order to demo improved functional strength and maximize her functional mobility.    Baseline  10/14: 13sec RLE, extended    Time  6    Period  Weeks    Status  On-going            Plan - 11/27/17 1343    Clinical Impression Statement  Pt presenting to therapy with significantly improved pain ratings in knee, stating that she rarely feels the sharp pain anymore. Since pt feeling better, slightly progressed pt this date while continuing to focus on mobility and increased focus on strengthening since her knee ROM has begun to normalize. Min reports of sharp pain during lateral steps ups this date; still feel this sharp pain is the patellar tendon and weakness in the quads. Began standing hip abd, mini squats, and fwd step ups this date, all with good tolerance and form with no reports of pain during. Gait without AD limited due to R ankle. Ended with manual for edema and joint  mobility. AROM 5 to 113deg this date. Continue as planned, progressing as able.     Rehab Potential  Good    PT Frequency  3x / week    PT Duration  6 weeks    PT Treatment/Interventions  ADLs/Self Care Home Management;Aquatic Therapy;Cryotherapy;Electrical Stimulation;Moist Heat;Ultrasound;DME Instruction;Gait training;Stair training;Functional mobility training;Therapeutic activities;Therapeutic exercise;Balance training;Neuromuscular re-education;Patient/family education;Orthotic Fit/Training;Manual techniques;Manual lymph drainage;Scar mobilization;Dry needling;Passive range of motion;Energy conservation;Taping;Spinal Manipulations;Joint Manipulations    PT Next Visit Plan  Continue focus on R knee AROM, manual to reduce adhesions at patellar tendon, and cotninue with strengthening as tolerated; Address floor transfers once ROM normalized and strength starts improving    PT Home Exercise Plan  eval: quad sets, heel slides, supine HS stretch;  10/2: bone foam for extension    Consulted and Agree with Plan of Care  Patient       Patient will benefit from skilled therapeutic intervention in order to improve the following deficits and impairments:  Abnormal gait, Decreased activity tolerance, Decreased balance, Decreased endurance, Decreased mobility, Decreased range of motion, Decreased scar mobility, Decreased strength, Difficulty walking, Hypomobility, Increased edema, Increased fascial restricitons, Impaired flexibility, Improper body mechanics, Pain  Visit Diagnosis: Acute pain of right knee  Muscle weakness (generalized)  Difficulty in walking, not elsewhere classified  Localized edema     Problem List Patient Active Problem List   Diagnosis Date Noted  . Status post total knee replacement, right 10/30/2017  . Obesity (BMI 30.0-34.9) 04/30/2017  . 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 Vaginal dryness, menopausal 08/21/2014  . Osteopenia 12/14/2013  . Essential  hypertension 12/14/2013  . Complex regional pain syndrome of lower limb 10/04/2012  . Hyperlipidemia 10/04/2012  . Allergic rhinitis 10/04/2012  . Generalized osteoarthrosis, involving multiple sites 10/04/2012       Geraldine Solar PT, DPT  [facility] [address] [place], [place], [address] Phone: [phone]   Fax:  [phone]  Name: [name] MRN: [id number] Date of Birth: 12-26-1945

## 2017-11-30 ENCOUNTER — Ambulatory Visit (HOSPITAL_COMMUNITY): Payer: Medicare Other

## 2017-11-30 ENCOUNTER — Encounter (HOSPITAL_COMMUNITY): Payer: Self-pay

## 2017-11-30 DIAGNOSIS — R600 Localized edema: Secondary | ICD-10-CM

## 2017-11-30 DIAGNOSIS — M6281 Muscle weakness (generalized): Secondary | ICD-10-CM

## 2017-11-30 DIAGNOSIS — M25561 Pain in right knee: Secondary | ICD-10-CM

## 2017-11-30 DIAGNOSIS — R262 Difficulty in walking, not elsewhere classified: Secondary | ICD-10-CM

## 2017-11-30 DIAGNOSIS — M25661 Stiffness of right knee, not elsewhere classified: Secondary | ICD-10-CM | POA: Diagnosis not present

## 2017-11-30 NOTE — Therapy (Signed)
[facility] [place], [place], [address] Phone: [phone]   Fax:  [phone]  Physical Therapy Treatment  Patient Details  Name: [name] MRN: [id number] Date of Birth: [birth year] Referring Provider (PT): Netta Cedars, MD   Encounter Date: 11/30/2017  PT End of Session - 11/30/17 1301    Visit Number  13    Number of Visits  19    Date for PT Re-Evaluation  12/14/17   MInireassess complete 11/23/17   Authorization Type  Medicare (Secondary: [employer])    Authorization Time Period  11/02/17 to 12/14/17    Authorization - Visit Number  57    Authorization - Number of Visits  89    PT Start Time  1950    PT Stop Time  1340    PT Time Calculation (min)  42 min    Activity Tolerance  Patient tolerated treatment well    Behavior During Therapy  [facility] for tasks assessed/performed       Past Medical History:  Diagnosis Date  . Allergy   . Arthritis   . Contact dermatitis and other eczema, due to unspecified cause   . Depression   . Encounter for long-term (current) use of other medications   . Hyperlipidemia   . Other and unspecified hyperlipidemia   . Pain in joint, ankle and foot   . Screening for malignant neoplasm of the cervix     Past Surgical History:  Procedure Laterality Date  . ANKLE SURGERY  05/2006   Right Ankle  . Carpel TUnnel Release  [id number]   [id number]), Left(2006)  . CATARACT EXTRACTION Left 10/03/14  . CATARACT EXTRACTION Right 11/16/2014  . COLONOSCOPY    . DIAGNOSTIC LAPAROSCOPY  1980  . JOINT REPLACEMENT     Total Knee(L)-11/05/07  . KNEE ARTHROPLASTY  05/2007   Left  . RHINOPLASTY  1969  . TONSILLECTOMY     @ 72 y.o.  . TOTAL ANKLE ARTHROPLASTY  10/30/2011   Procedure: TOTAL ANKLE ARTHOPLASTY;  Surgeon: Wylene Simmer, MD;  Location: [place];  Service: Orthopedics;  Laterality: Right;  RIGHT TOTAL ANKLE REPLACEMENT, POSSIBLE GASTROC RESESSION  . TOTAL KNEE ARTHROPLASTY Right 10/30/2017    Procedure: RIGHT TOTAL KNEE ARTHROPLASTY;  Surgeon: Netta Cedars, MD;  Location: [place];  Service: Orthopedics;  Laterality: Right;  . TUBAL LIGATION  1980    There were no vitals filed for this visit.  Subjective Assessment - 11/30/17 1302    Subjective  Pt reports that she has not been taking pain pills as her knee has not really been hurting, but she states it still feels very tight when she puts weight through it.    Pertinent History  TKA on 10/30/17    Patient Stated Goals  rehab the knee where she can bend and straighten it and walk wihtout assistance    Currently in Pain?  No/denies    Pain Onset  1 to 4 weeks ago          [facility] Adult PT Treatment/Exercise - 11/30/17 0001      Exercises   Exercises  Knee/Hip      Knee/Hip Exercises: Stretches   Passive Hamstring Stretch  Right;3 reps;30 seconds    Passive Hamstring Stretch Limitations  standing, 12" step    Knee: Self-Stretch to increase Flexion  Right;10 seconds    Knee: Self-Stretch Limitations  x10 reps, standing, 12" step    Gastroc Stretch  Both;3 reps;30 seconds  Gastroc Stretch Limitations  slant board      Knee/Hip Exercises: Aerobic   Stationary Bike  x4 mins, rocking and fwd full revolution for mobility, seat 7      Knee/Hip Exercises: Standing   Forward Lunges  Both;10 reps    Forward Lunges Limitations  feet flat on floor    Terminal Knee Extension  Right;15 reps    Terminal Knee Extension Limitations  10" holds, GTB    Hip Abduction  Both;2 sets;10 reps    Abduction Limitations  RTB    Lateral Step Up  Right;15 reps;Step Height: 4";Hand Hold: 2    Lateral Step Up Limitations  minimal pain    Forward Step Up  Right;15 reps;Step Height: 4";Hand Hold: 2    Step Down  Right;10 reps;Step Height: 4";Hand Hold: 2      Knee/Hip Exercises: Seated   Sit to Sand  10 reps;without UE support   2" step under LLE to improve RLE weight shift     Knee/Hip Exercises: Supine   Knee Extension Limitations  5     Knee Flexion Limitations  117      Manual Therapy   Manual Therapy  Edema management;Joint mobilization;Soft tissue mobilization    Manual therapy comments  completed separate rest of session    Edema Management  retro massage BLE elevated to decrease edema and pain    Joint Mobilization  patellar mobs all directions for improved mobility; Grade III-IV AP and PA tibiofemoral joint mobs for improved joint mobility    Soft tissue mobilization  VMO, proximal scar, and at medial scar to decreased restrictions           PT Education - 11/30/17 1301    Education Details  exercise technique, continue HEP    Person(s) Educated  Patient    Methods  Explanation;Demonstration    Comprehension  Verbalized understanding;Returned demonstration       PT Short Term Goals - 11/23/17 1305      PT SHORT TERM GOAL #1   Title  Pt will have improved R knee AROM from 5-90deg in order to decrease pain and maximize gait.    Baseline  10/14: 8-107deg    Time  3    Period  Weeks    Status  Partially Met      PT SHORT TERM GOAL #2   Title  Pt will have decreased edema by 2cm or > in order to maximize R knee AROM.     Time  3    Period  Weeks    Status  Achieved      PT SHORT TERM GOAL #3   Title  Pt will have 1/2 grade improvement in MMT throughout in order to maximize balance and functional mobility.    Baseline  10/14: see MMT    Time  3    Period  Weeks    Status  Partially Met        PT Long Term Goals - 11/23/17 1305      PT LONG TERM GOAL #1   Title  Pt will have improved R knee AROM from 0-125deg in order to further maximize gait and stair ambulation as well as floor transfers.    Baseline  10/14: 8-107deg    Time  6    Period  Weeks    Status  On-going      PT LONG TERM GOAL #2   Title  Pt will have 1 grade improvement in MMT throughout  in order to further maximize gait, stair ambulation, and floor transfers.    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    Baseline  10/14: R: 6 sec, L: 12sec    Time  6    Period  Weeks    Status  On-going      PT LONG TERM GOAL #5   Title  Pt will have improved 5xSTS to 12 sec or < with proper form in order to demo improved functional strength and maximize her functional mobility.    Baseline  10/14: 13sec RLE, extended    Time  6    Period  Weeks    Status  On-going            Plan - 11/30/17 1341    Clinical Impression Statement  Continued with established POC focusing on R knee ROM and strengthening. Able to increase reps during therex without increases in pain. Added lunging and step downs on 4" step which pt reported caused her discomfort at anterior knee during both activities, but PT educated pt that this was normal. She did report decreased R knee pain with lateral step ups, which is improved from previous sessions. Ended with manual for joint mobility, edema, pain control. AROM 5 to 117deg this date.     Rehab Potential  Good    PT Frequency  3x / week    PT Duration  6 weeks    PT Treatment/Interventions  ADLs/Self Care Home Management;Aquatic Therapy;Cryotherapy;Electrical Stimulation;Moist Heat;Ultrasound;DME Instruction;Gait training;Stair training;Functional mobility training;Therapeutic activities;Therapeutic exercise;Balance training;Neuromuscular re-education;Patient/family education;Orthotic Fit/Training;Manual techniques;Manual lymph drainage;Scar mobilization;Dry  needling;Passive range of motion;Energy conservation;Taping;Spinal Manipulations;Joint Manipulations    PT Next Visit Plan  Continue focus on R knee AROM, especially extension, manual to reduce adhesions at patellar tendon, and cotninue with strengthening as tolerated; Address floor transfers once ROM normalized and strength starts improving    PT Home Exercise Plan  eval: quad sets, heel slides, supine HS stretch; 10/2: bone foam for extension    Consulted and Agree with Plan of Care  Patient       Patient will benefit from skilled therapeutic intervention in order to improve the following deficits and impairments:  Abnormal gait, Decreased activity tolerance, Decreased balance, Decreased endurance, Decreased mobility, Decreased range of motion, Decreased scar mobility, Decreased strength, Difficulty walking, Hypomobility, Increased edema, Increased fascial restricitons, Impaired flexibility, Improper body mechanics, Pain  Visit Diagnosis: Acute pain of right knee  Muscle weakness (generalized)  Difficulty in walking, not elsewhere classified  Localized edema     Problem List Patient Active Problem List   Diagnosis Date Noted  . Status post total knee replacement, right 10/30/2017  . Obesity (BMI 30.0-34.9) 04/30/2017  . Map-dot-fingerprint  corneal dystrophy 08/21/2014  . Vaginal dryness, menopausal 08/21/2014  . Osteopenia 12/14/2013  . Essential hypertension 12/14/2013  . Complex regional pain syndrome of lower limb 10/04/2012  . Hyperlipidemia 10/04/2012  . Allergic rhinitis 10/04/2012  . Generalized osteoarthrosis, involving multiple sites 10/04/2012       Geraldine Solar PT, DPT  [place] [facility] [address] [place], [place], [address] Phone: [phone]   Fax:  [phone]  Name: [name] MRN: [id number] Date of Birth: October 28, 1945

## 2017-12-02 ENCOUNTER — Ambulatory Visit (HOSPITAL_COMMUNITY): Payer: Medicare Other

## 2017-12-02 ENCOUNTER — Encounter (HOSPITAL_COMMUNITY): Payer: Self-pay

## 2017-12-02 DIAGNOSIS — M25561 Pain in right knee: Secondary | ICD-10-CM | POA: Diagnosis not present

## 2017-12-02 DIAGNOSIS — R600 Localized edema: Secondary | ICD-10-CM

## 2017-12-02 DIAGNOSIS — M6281 Muscle weakness (generalized): Secondary | ICD-10-CM | POA: Diagnosis not present

## 2017-12-02 DIAGNOSIS — M25661 Stiffness of right knee, not elsewhere classified: Secondary | ICD-10-CM

## 2017-12-02 DIAGNOSIS — R262 Difficulty in walking, not elsewhere classified: Secondary | ICD-10-CM | POA: Diagnosis not present

## 2017-12-02 NOTE — Therapy (Signed)
[place], [place], [address] Phone: [phone]   Fax:  [phone]  Physical Therapy Treatment  Patient Details  Name: [name] MRN: [id number] Date of Birth: May 15, 1945 Referring Provider (PT): Netta Cedars, MD   Encounter Date: 12/02/2017  PT End of Session - 12/02/17 1325    Visit Number  14    Number of Visits  19    Date for PT Re-Evaluation  12/14/17   Minireassess complete 11/23/17   Authorization Type  Medicare (Secondary: [employer])    Authorization Time Period  11/02/17 to 12/14/17    Authorization - Visit Number  28    Authorization - Number of Visits  60    PT Start Time  1301   4' on bike, not included with in charges   PT Stop Time  1347    PT Time Calculation (min)  46 min    Activity Tolerance  Patient tolerated treatment well    Behavior During Therapy  [facility] for tasks assessed/performed       Past Medical History:  Diagnosis Date  . Allergy   . Arthritis   . Contact dermatitis and other eczema, due to unspecified cause   . Depression   . Encounter for long-term (current) use of other medications   . Hyperlipidemia   . Other and unspecified hyperlipidemia   . Pain in joint, ankle and foot   . Screening for malignant neoplasm of the cervix     Past Surgical History:  Procedure Laterality Date  . ANKLE SURGERY  05/2006   Right Ankle  . Carpel TUnnel Release  [id number]   [id number]), Left(2006)  . CATARACT EXTRACTION Left 10/03/14  . CATARACT EXTRACTION Right 11/16/2014  . COLONOSCOPY    . DIAGNOSTIC LAPAROSCOPY  1980  . JOINT REPLACEMENT     Total Knee(L)-11/05/07  . KNEE ARTHROPLASTY  05/2007   Left  . RHINOPLASTY  1969  . TONSILLECTOMY     @ 72 y.o.  . TOTAL ANKLE ARTHROPLASTY  10/30/2011   Procedure: TOTAL ANKLE ARTHOPLASTY;  Surgeon: Wylene Simmer, MD;  Location: [place];  Service: Orthopedics;  Laterality: Right;  RIGHT TOTAL ANKLE REPLACEMENT, POSSIBLE GASTROC RESESSION  .  TOTAL KNEE ARTHROPLASTY Right 10/30/2017   Procedure: RIGHT TOTAL KNEE ARTHROPLASTY;  Surgeon: Netta Cedars, MD;  Location: [place];  Service: Orthopedics;  Laterality: Right;  . TUBAL LIGATION  1980    There were no vitals filed for this visit.  Subjective Assessment - 12/02/17 1309    Subjective  Pt stated she was on her feet a lot yesterday, stated she walked over 8,030f which is more than she has since surgery.  Called MD and was released to drive.  No longer taking any pain medication.      Patient Stated Goals  rehab the knee where she can bend and straighten it and walk wihtout assistance    Currently in Pain?  Yes    Pain Score  2     Pain Location  Knee    Pain Orientation  Right         [facility]      Assessment   Medical Diagnosis  R TKA    Referring Provider (PT)  SNetta Cedars MD    Onset Date/Surgical Date  10/30/17    Hand Dominance  Left    Next MD Visit  12/09/2017    Prior Therapy  OPPT for R arthroscopic  surgery prior to TKA                   [facility] Adult PT Treatment/Exercise - 12/02/17 0001      Knee/Hip Exercises: Stretches   Passive Hamstring Stretch  Right;3 reps;30 seconds    Passive Hamstring Stretch Limitations  standing, 12" step    Knee: Self-Stretch to increase Flexion  Right;10 seconds    Knee: Self-Stretch Limitations  x10 reps, standing, 12" step    Gastroc Stretch  Both;3 reps;30 seconds    Gastroc Stretch Limitations  slant board      Knee/Hip Exercises: Aerobic   Stationary Bike  x4 mins, rocking and fwd full revolution for mobility, seat 7      Knee/Hip Exercises: Standing   Forward Lunges  Both;15 reps    Forward Lunges Limitations  on floor    Terminal Knee Extension  Right;15 reps    Terminal Knee Extension Limitations  10" holds, GTB    Lateral Step Up  Right;15 reps;Step Height: 4";Hand Hold: 2    Lateral Step Up Limitations  minimal pain    Forward Step Up  Right;15 reps;Hand Hold: 1;Hand  Hold: 0;Step Height: 6"    Step Down  Right;15 reps;Hand Hold: 1;Step Height: 4"    Functional Squat  15 reps    Functional Squat Limitations  BUE support on // bars      Knee/Hip Exercises: Seated   Sit to Sand  10 reps;without UE support      Knee/Hip Exercises: Supine   Terminal Knee Extension  AROM;Right;15 reps    Terminal Knee Extension Limitations  1/2 bolster under knee    Knee Extension  AROM    Knee Extension Limitations  4   was 5 last session   Knee Flexion  AROM    Knee Flexion Limitations  120   was 117 degrees last session     Manual Therapy   Manual Therapy  Edema management;Joint mobilization;Soft tissue mobilization    Manual therapy comments  completed separate rest of session    Edema Management  retro massage BLE elevated to decrease edema and pain    Joint Mobilization  patellar mobs all directions for improved mobility; Grade III-IV AP and PA tibiofemoral joint mobs for improved joint mobility    Soft tissue mobilization  VMO, proximal scar, and at medial scar to decreased restrictions               PT Short Term Goals - 11/23/17 1305      PT SHORT TERM GOAL #1   Title  Pt will have improved R knee AROM from 5-90deg in order to decrease pain and maximize gait.    Baseline  10/14: 8-107deg    Time  3    Period  Weeks    Status  Partially Met      PT SHORT TERM GOAL #2   Title  Pt will have decreased edema by 2cm or > in order to maximize R knee AROM.     Time  3    Period  Weeks    Status  Achieved      PT SHORT TERM GOAL #3   Title  Pt will have 1/2 grade improvement in MMT throughout in order to maximize balance and functional mobility.    Baseline  10/14: see MMT    Time  3    Period  Weeks    Status  Partially Met  PT Long Term Goals - 11/23/17 1305      PT LONG TERM GOAL #1   Title  Pt will have improved R knee AROM from 0-125deg in order to further maximize gait and stair ambulation as well as floor transfers.     Baseline  10/14: 8-107deg    Time  6    Period  Weeks    Status  On-going      PT LONG TERM GOAL #2   Title  Pt will have 1 grade improvement in MMT throughout in order to further maximize gait, stair ambulation, and floor transfers.    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    Baseline  10/14: R: 6 sec, L: 12sec    Time  6    Period  Weeks    Status  On-going      PT LONG TERM GOAL #5   Title  Pt will have improved 5xSTS to 12 sec or < with proper form in order to demo improved functional strength and maximize her functional mobility.    Baseline  10/14: 13sec RLE, extended    Time  6    Period  Weeks    Status  On-going            Plan - 12/02/17 1403    Clinical Impression Statement  Continued with established POC for knee mobility and functional strengthening.  Pt progressing well with ability to increase step up height for quadricep strengthening.  Most difficulty with mechanics with lunges this session, increased cueing required for proper form.  Continues to have edema present proximal knee, EOS with manual retro massage for edema and pain control.  AROM at EOS 4-120 degrees (was 5-117 degrees last session.)    Rehab Potential  Good    PT Frequency  3x / week    PT Duration  6 weeks    PT Treatment/Interventions  ADLs/Self Care Home Management;Aquatic Therapy;Cryotherapy;Electrical Stimulation;Moist Heat;Ultrasound;DME Instruction;Gait  training;Stair training;Functional mobility training;Therapeutic activities;Therapeutic exercise;Balance training;Neuromuscular re-education;Patient/family education;Orthotic Fit/Training;Manual techniques;Manual lymph drainage;Scar mobilization;Dry needling;Passive range of motion;Energy conservation;Taping;Spinal Manipulations;Joint Manipulations    PT Next Visit Plan  Continue focus on R knee AROM, especially extension, manual to reduce adhesions at patellar tendon, and cotninue with strengthening as tolerated; Address floor transfers once ROM normalized and strength starts improving    PT Home Exercise Plan  eval: quad sets, heel slides, supine HS stretch; 10/2: bone foam for extension       Patient will benefit from skilled therapeutic intervention in order to improve the following deficits and impairments:  Abnormal gait, Decreased activity tolerance, Decreased balance, Decreased endurance, Decreased mobility, Decreased range of motion, Decreased scar mobility, Decreased strength, Difficulty walking, Hypomobility,  Increased edema, Increased fascial restricitons, Impaired flexibility, Improper body mechanics, Pain  Visit Diagnosis: Acute pain of right knee  Muscle weakness (generalized)  Difficulty in walking, not elsewhere classified  Localized edema  Stiffness of right knee, not elsewhere classified     Problem List Patient Active Problem List   Diagnosis Date Noted  . Status post total knee replacement, right 10/30/2017  . Obesity (BMI 30.0-34.9) 04/30/2017  . 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 Vaginal dryness, menopausal 08/21/2014  . Osteopenia 12/14/2013  . Essential hypertension 12/14/2013  . Complex regional pain syndrome of lower limb 10/04/2012  . Hyperlipidemia 10/04/2012  . Allergic rhinitis 10/04/2012  . Generalized osteoarthrosis, involving multiple sites 10/04/2012   Ihor Austin, Ronald; [place]  Aldona Lento 12/02/2017,  5:18 PM  [place], [place], [address] Phone: [phone]   Fax:  [phone]  Name: [name] MRN: [id number] Date of Birth: 1945/07/01

## 2017-12-04 ENCOUNTER — Ambulatory Visit (HOSPITAL_COMMUNITY): Payer: Medicare Other

## 2017-12-04 ENCOUNTER — Encounter (HOSPITAL_COMMUNITY): Payer: Self-pay

## 2017-12-04 DIAGNOSIS — R262 Difficulty in walking, not elsewhere classified: Secondary | ICD-10-CM

## 2017-12-04 DIAGNOSIS — M25661 Stiffness of right knee, not elsewhere classified: Secondary | ICD-10-CM | POA: Diagnosis not present

## 2017-12-04 DIAGNOSIS — M25561 Pain in right knee: Secondary | ICD-10-CM | POA: Diagnosis not present

## 2017-12-04 DIAGNOSIS — M6281 Muscle weakness (generalized): Secondary | ICD-10-CM | POA: Diagnosis not present

## 2017-12-04 DIAGNOSIS — R600 Localized edema: Secondary | ICD-10-CM

## 2017-12-04 NOTE — Therapy (Signed)
[place], [place], [address] Phone: [phone]   Fax:  [phone]  Physical Therapy Treatment  Patient Details  Name: [name] MRN: [id number] Date of Birth: Jan 26, 1946 Referring Provider (PT): Netta Cedars, MD   Encounter Date: 12/04/2017  PT End of Session - 12/04/17 1301    Visit Number  15    Number of Visits  19    Date for PT Re-Evaluation  12/14/17   Minireassess complete 11/23/17   Authorization Type  Medicare (Secondary: [employer])    Authorization Time Period  11/02/17 to 12/14/17    Authorization - Visit Number  40    Authorization - Number of Visits  60    PT Start Time  3903    PT Stop Time  1343    PT Time Calculation (min)  44 min    Activity Tolerance  Patient tolerated treatment well    Behavior During Therapy  [facility] for tasks assessed/performed       Past Medical History:  Diagnosis Date  . Allergy   . Arthritis   . Contact dermatitis and other eczema, due to unspecified cause   . Depression   . Encounter for long-term (current) use of other medications   . Hyperlipidemia   . Other and unspecified hyperlipidemia   . Pain in joint, ankle and foot   . Screening for malignant neoplasm of the cervix     Past Surgical History:  Procedure Laterality Date  . ANKLE SURGERY  05/2006   Right Ankle  . Carpel TUnnel Release  [id number]   [id number]), Left(2006)  . CATARACT EXTRACTION Left 10/03/14  . CATARACT EXTRACTION Right 11/16/2014  . COLONOSCOPY    . DIAGNOSTIC LAPAROSCOPY  1980  . JOINT REPLACEMENT     Total Knee(L)-11/05/07  . KNEE ARTHROPLASTY  05/2007   Left  . RHINOPLASTY  1969  . TONSILLECTOMY     @ 72 y.o.  . TOTAL ANKLE ARTHROPLASTY  10/30/2011   Procedure: TOTAL ANKLE ARTHOPLASTY;  Surgeon: Wylene Simmer, MD;  Location: [place];  Service: Orthopedics;  Laterality: Right;  RIGHT TOTAL ANKLE REPLACEMENT, POSSIBLE GASTROC RESESSION  . TOTAL KNEE ARTHROPLASTY Right 10/30/2017    Procedure: RIGHT TOTAL KNEE ARTHROPLASTY;  Surgeon: Netta Cedars, MD;  Location: [place];  Service: Orthopedics;  Laterality: Right;  . TUBAL LIGATION  1980    There were no vitals filed for this visit.  Subjective Assessment - 12/04/17 1302    Subjective  Pt reports that her knee was stiff yesterday but says that she feels really good today. No real pain, just uncomfortable and tight.     Patient Stated Goals  rehab the knee where she can bend and straighten it and walk wihtout assistance    Currently in Pain?  No/denies            [facility] Adult PT Treatment/Exercise - 12/04/17 0001      Exercises   Exercises  Knee/Hip      Knee/Hip Exercises: Stretches   Passive Hamstring Stretch  Right;3 reps;30 seconds    Passive Hamstring Stretch Limitations  standing, 12" step    Knee: Self-Stretch to increase Flexion  Right;10 seconds    Knee: Self-Stretch Limitations  x10 reps, standing, 12" step    Gastroc Stretch  Both;3 reps;30 seconds    Gastroc Stretch Limitations  slant board      Knee/Hip Exercises: Aerobic   Stationary Bike  x4 mins, rocking and fwd full  revolution for mobility, seat 6      Knee/Hip Exercises: Standing   Forward Lunges  Both;15 reps    Forward Lunges Limitations  on floor    Lateral Step Up  Right;15 reps;Step Height: 6";Hand Hold: 0    Lateral Step Up Limitations  minimal pain    Forward Step Up  Right;15 reps;Step Height: 6";Hand Hold: 0    Step Down  Right;15 reps;Step Height: 6";Hand Hold: 2    Functional Squat  20 reps    Functional Squat Limitations  BUE support on // bars    Wall Squat  15 reps    Wall Squat Limitations  cues for form    Other Standing Knee Exercises  sidestepping RTB 66f x2RT      Knee/Hip Exercises: Seated   Sit to Sand  15 reps;without UE support   RTB knees, 2" step under LLE     Knee/Hip Exercises: Supine   Knee Extension Limitations  4    Knee Flexion Limitations  116      Manual Therapy   Manual Therapy  Edema  management;Joint mobilization;Soft tissue mobilization    Manual therapy comments  completed separate rest of session    Edema Management  retro massage BLE elevated to decrease edema and pain    Joint Mobilization  AP and PA tibiofemoral joint mobs for flexion and extension; tibial ER at end range extension for extension ROM    Soft tissue mobilization  VMO, proximal scar, and at medial scar to decreased restrictions             PT Education - 12/04/17 1301    Education Details  exercise technique, continue HEP    Person(s) Educated  Patient    Methods  Explanation;Demonstration    Comprehension  Verbalized understanding;Returned demonstration       PT Short Term Goals - 11/23/17 1305      PT SHORT TERM GOAL #1   Title  Pt will have improved R knee AROM from 5-90deg in order to decrease pain and maximize gait.    Baseline  10/14: 8-107deg    Time  3    Period  Weeks    Status  Partially Met      PT SHORT TERM GOAL #2   Title  Pt will have decreased edema by 2cm or > in order to maximize R knee AROM.     Time  3    Period  Weeks    Status  Achieved      PT SHORT TERM GOAL #3   Title  Pt will have 1/2 grade improvement in MMT throughout in order to maximize balance and functional mobility.    Baseline  10/14: see MMT    Time  3    Period  Weeks    Status  Partially Met        PT Long Term Goals - 11/23/17 1305      PT LONG TERM GOAL #1   Title  Pt will have improved R knee AROM from 0-125deg in order to further maximize gait and stair ambulation as well as floor transfers.    Baseline  10/14: 8-107deg    Time  6    Period  Weeks    Status  On-going      PT LONG TERM GOAL #2   Title  Pt will have 1 grade improvement in MMT throughout in order to further maximize gait, stair ambulation, and floor transfers.  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    Baseline  10/14: R: 6 sec, L: 12sec    Time  6    Period  Weeks    Status  On-going      PT LONG TERM GOAL #5   Title  Pt will have improved 5xSTS to 12 sec or < with proper form in order to demo improved functional strength and maximize her functional mobility.    Baseline  10/14: 13sec RLE, extended    Time  6    Period  Weeks    Status  On-going            Plan - 12/04/17 1343    Clinical Impression Statement  Continued with established POC focusing on last bit of remaining ROM, mainly extension, and increasing focus on functional strengthening. Continued with standing strengthening and added wall squats this date for continued quad strengthening and added sidestepping for hip strength to maximize gait; only reports of mild discomfort at anterior knee. Progressed step down height to 6"; good tolerance, again, just reports of mild discomfort anterior knee. Added tibial ER joint mobs at end range extension to address last few degrees of lacking extension. AROM 4 to 116deg this date. Continue as planned, progressing as able.     Rehab Potential  Good    PT Frequency  3x / week    PT Duration  6 weeks    PT Treatment/Interventions  ADLs/Self Care Home Management;Aquatic Therapy;Cryotherapy;Electrical Stimulation;Moist Heat;Ultrasound;DME Instruction;Gait training;Stair training;Functional mobility training;Therapeutic activities;Therapeutic exercise;Balance training;Neuromuscular re-education;Patient/family education;Orthotic Fit/Training;Manual  techniques;Manual lymph drainage;Scar mobilization;Dry needling;Passive range of motion;Energy conservation;Taping;Spinal Manipulations;Joint Manipulations    PT Next Visit Plan  Continue focus on R knee AROM, especially extension, manual to reduce adhesions at patellar tendon, and cotninue with strengthening as tolerated; Address floor transfers once ROM normalized and strength starts improving    PT Home Exercise Plan  eval: quad sets, heel slides, supine HS stretch; 10/2: bone foam for extension    Consulted and Agree with Plan of Care  Patient       Patient will benefit from skilled therapeutic intervention in order to improve the following deficits and impairments:  Abnormal gait, Decreased activity tolerance, Decreased balance, Decreased endurance, Decreased mobility, Decreased range of motion, Decreased scar mobility, Decreased strength, Difficulty walking, Hypomobility, Increased edema, Increased fascial restricitons, Impaired flexibility, Improper body mechanics, Pain  Visit Diagnosis: Acute pain of right knee  Muscle weakness (generalized)  Difficulty in walking, not elsewhere classified  Localized edema     Problem List Patient Active Problem List   Diagnosis Date Noted  . Status post total knee replacement, right 10/30/2017  . Obesity (BMI 30.0-34.9) 04/30/2017  . Map-dot-fingerprint  corneal dystrophy 08/21/2014  . Vaginal dryness, menopausal 08/21/2014  . Osteopenia 12/14/2013  . Essential hypertension 12/14/2013  . Complex regional pain syndrome of lower limb 10/04/2012  . Hyperlipidemia 10/04/2012  . Allergic rhinitis 10/04/2012  . Generalized osteoarthrosis, involving multiple sites 10/04/2012         Geraldine Solar PT, DPT  [facility] [address] [place], [place], [address] Phone: [phone]   Fax:  [phone]  Name: [name] MRN: [id number] Date of Birth: [birth year]

## 2017-12-07 ENCOUNTER — Ambulatory Visit (HOSPITAL_COMMUNITY): Payer: Medicare Other

## 2017-12-07 ENCOUNTER — Encounter (HOSPITAL_COMMUNITY): Payer: Self-pay

## 2017-12-07 DIAGNOSIS — M25561 Pain in right knee: Secondary | ICD-10-CM | POA: Diagnosis not present

## 2017-12-07 DIAGNOSIS — M6281 Muscle weakness (generalized): Secondary | ICD-10-CM | POA: Diagnosis not present

## 2017-12-07 DIAGNOSIS — R600 Localized edema: Secondary | ICD-10-CM | POA: Diagnosis not present

## 2017-12-07 DIAGNOSIS — R262 Difficulty in walking, not elsewhere classified: Secondary | ICD-10-CM | POA: Diagnosis not present

## 2017-12-07 DIAGNOSIS — M25661 Stiffness of right knee, not elsewhere classified: Secondary | ICD-10-CM | POA: Diagnosis not present

## 2017-12-07 NOTE — Therapy (Signed)
[place], [place], [address] Phone: [phone]   Fax:  [phone]  Physical Therapy Treatment  Patient Details  Name: [name] MRN: [id number] Date of Birth: 10-29-1945 Referring Provider (PT): Netta Cedars, MD   Encounter Date: 12/07/2017  PT End of Session - 12/07/17 1301    Visit Number  16    Number of Visits  19    Date for PT Re-Evaluation  12/14/17   Minireassess complete 11/23/17   Authorization Type  Medicare (Secondary: [employer])    Authorization Time Period  11/02/17 to 12/14/17    Authorization - Visit Number  61    Authorization - Number of Visits  60    PT Start Time  7106    PT Stop Time  1342    PT Time Calculation (min)  44 min    Activity Tolerance  Patient tolerated treatment well    Behavior During Therapy  [facility] for tasks assessed/performed       Past Medical History:  Diagnosis Date  . Allergy   . Arthritis   . Contact dermatitis and other eczema, due to unspecified cause   . Depression   . Encounter for long-term (current) use of other medications   . Hyperlipidemia   . Other and unspecified hyperlipidemia   . Pain in joint, ankle and foot   . Screening for malignant neoplasm of the cervix     Past Surgical History:  Procedure Laterality Date  . ANKLE SURGERY  05/2006   Right Ankle  . Carpel TUnnel Release  [id number]   [id number]), Left(2006)  . CATARACT EXTRACTION Left 10/03/14  . CATARACT EXTRACTION Right 11/16/2014  . COLONOSCOPY    . DIAGNOSTIC LAPAROSCOPY  1980  . JOINT REPLACEMENT     Total Knee(L)-11/05/07  . KNEE ARTHROPLASTY  05/2007   Left  . RHINOPLASTY  1969  . TONSILLECTOMY     @ 72 y.o.  . TOTAL ANKLE ARTHROPLASTY  10/30/2011   Procedure: TOTAL ANKLE ARTHOPLASTY;  Surgeon: Wylene Simmer, MD;  Location: [place];  Service: Orthopedics;  Laterality: Right;  RIGHT TOTAL ANKLE REPLACEMENT, POSSIBLE GASTROC RESESSION  . TOTAL KNEE ARTHROPLASTY Right 10/30/2017   Procedure: RIGHT TOTAL KNEE ARTHROPLASTY;  Surgeon: Netta Cedars, MD;  Location: [place] [facility];  Service: Orthopedics;  Laterality: Right;  . TUBAL LIGATION  1980    There were no vitals filed for this visit.  Subjective Assessment - 12/07/17 1302    Subjective  Pt states that she is "pretty disgusted" that she hasn't progressed like she wants to. She states that she didn't do too much walking over the weekend but states she should have worn her compression garment becuase she did a lot of driving and sitting at the dog fields.    Patient Stated Goals  rehab the knee where she can bend and straighten it and walk wihtout assistance    Currently in Pain?  No/denies   no pain, just tight and very sore            [facility] Adult PT Treatment/Exercise - 12/07/17 0001      Exercises   Exercises  Knee/Hip      Knee/Hip Exercises: Stretches   Passive Hamstring Stretch  Right;3 reps;30 seconds    Passive Hamstring Stretch Limitations  standing, 12" step    Quad Stretch  Right;3 reps;30 seconds    Quad Stretch Limitations  prone with rope    Knee: Self-Stretch to increase Flexion  Right;10 seconds    Knee: Self-Stretch Limitations  x10 reps, standing, 12" step    Gastroc Stretch  Both;3 reps;30 seconds    Gastroc Stretch Limitations  slant board      Knee/Hip Exercises: Aerobic   Stationary Bike  x4 mins, rocking and fwd full revolution for mobility, seat 6      Knee/Hip Exercises: Standing   Heel Raises  Both;20 reps    Heel Raises Limitations  heel and toe    Forward Lunges  Both;15 reps    Forward Lunges Limitations  on floor    Lateral Step Up  Right;15 reps;Step Height: 6";Hand Hold: 0    Lateral Step Up Limitations  minor discomfort    Forward Step Up  Right;15 reps;Step Height: 6";Hand Hold: 0    Step Down  Right;15 reps;Step Height: 6";Hand Hold: 2      Knee/Hip Exercises: Seated   Stool Scoot - Round Trips  fwd/retro x2RT RLE only      Knee/Hip Exercises: Supine   Knee  Extension Limitations  4    Knee Flexion Limitations  116      Manual Therapy   Manual Therapy  Edema management;Joint mobilization;Soft tissue mobilization    Manual therapy comments  completed separate rest of session    Edema Management  retro massage BLE elevated to decrease edema and pain    Joint Mobilization  AP and PA tibiofemoral joint mobs for flexion and extension; tibial ER at end range extension for extension ROM    Soft tissue mobilization  VMO, proximal scar, and at medial scar to decreased restrictions             PT Education - 12/07/17 1301    Education Details  exercise technique, updated HEP    Person(s) Educated  Patient    Methods  Explanation;Demonstration    Comprehension  Verbalized understanding;Returned demonstration       PT Short Term Goals - 11/23/17 1305      PT SHORT TERM GOAL #1   Title  Pt will have improved R knee AROM from 5-90deg in order to decrease pain and maximize gait.    Baseline  10/14: 8-107deg    Time  3    Period  Weeks    Status  Partially Met      PT SHORT TERM GOAL #2   Title  Pt will have decreased edema by 2cm or > in order to maximize R knee AROM.     Time  3    Period  Weeks    Status  Achieved      PT SHORT TERM GOAL #3   Title  Pt will have 1/2 grade improvement in MMT throughout in order to maximize balance and functional mobility.    Baseline  10/14: see MMT    Time  3    Period  Weeks    Status  Partially Met        PT Long Term Goals - 11/23/17 1305      PT LONG TERM GOAL #1   Title  Pt will have improved R knee AROM from 0-125deg in order to further maximize gait and stair ambulation as well as floor transfers.    Baseline  10/14: 8-107deg    Time  6    Period  Weeks    Status  On-going      PT LONG TERM GOAL #2   Title  Pt will have 1 grade improvement in MMT throughout  in order to further maximize gait, stair ambulation, and floor transfers.    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    Baseline  10/14: R: 6 sec, L: 12sec    Time  6    Period  Weeks    Status  On-going      PT LONG TERM GOAL #5   Title  Pt will have improved 5xSTS to 12 sec or < with proper form in order to demo improved functional strength and maximize her functional mobility.    Baseline  10/14: 13sec RLE, extended    Time  6    Period  Weeks    Status  On-going            Plan - 12/07/17 1325    Clinical Impression Statement  Pt presents to therapy stating that she is disgusted that her knee hasn't progressed like she wanted it to over the weekend. She is still having tightness and her sharp pain at anterior knee started to occur some over the weekend. PT reminded pt that she is just over 5 weeks out from her surgery and that she is doing very well with her recovery/rehab process. Today's session continued to focus on ROM, strengthening, and address c/o pain. Min cues for form during therex, and still only minor discomfort reported during lateral step ups. Ended with joint mobs and STM for ROM and pain control. AROM 4 to 116deg. Pt sees MD Wednesday so PT sent MD today's note.     Rehab Potential  Good    PT Frequency  3x / week    PT Duration  6 weeks    PT Treatment/Interventions  ADLs/Self Care Home Management;Aquatic Therapy;Cryotherapy;Electrical Stimulation;Moist Heat;Ultrasound;DME Instruction;Gait training;Stair training;Functional mobility training;Therapeutic  activities;Therapeutic exercise;Balance training;Neuromuscular re-education;Patient/family education;Orthotic Fit/Training;Manual techniques;Manual lymph drainage;Scar mobilization;Dry needling;Passive range of motion;Energy conservation;Taping;Spinal Manipulations;Joint Manipulations    PT Next Visit Plan  Continue focus on R knee AROM, especially extension, manual to reduce adhesions at patellar tendon, and cotninue with strengthening as tolerated; Address floor transfers once ROM normalized and strength starts improving    PT Home Exercise Plan  eval: quad sets, heel slides, supine HS stretch; 10/2: bone foam for extension; 10/28: prone knee hang, SAQ, SLR, TKE, heel and toe raises    Consulted and Agree with Plan of Care  Patient       Patient will benefit from skilled therapeutic intervention in order to improve the following deficits and impairments:  Abnormal gait, Decreased activity tolerance, Decreased balance, Decreased endurance, Decreased mobility, Decreased range of motion, Decreased scar mobility, Decreased strength, Difficulty walking, Hypomobility, Increased edema, Increased fascial restricitons, Impaired flexibility, Improper body mechanics, Pain  Visit Diagnosis: Acute pain of  right knee  Muscle weakness (generalized)  Difficulty in walking, not elsewhere classified  Localized edema     Problem List Patient Active Problem List   Diagnosis Date Noted  . Status post total knee replacement, right 10/30/2017  . Obesity (BMI 30.0-34.9) 04/30/2017  . 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 Vaginal dryness, menopausal 08/21/2014  . Osteopenia 12/14/2013  . Essential hypertension 12/14/2013  . Complex regional pain syndrome of lower limb 10/04/2012  . Hyperlipidemia 10/04/2012  . Allergic rhinitis 10/04/2012  . Generalized osteoarthrosis, involving multiple sites 10/04/2012      Geraldine Solar PT, DPT  [facility] [address] [place], [place], [address] Phone: [phone]   Fax:  [phone]  Name: [name] MRN: [id number] Date of Birth: 01-Dec-1945

## 2017-12-09 ENCOUNTER — Encounter (HOSPITAL_COMMUNITY): Payer: Self-pay

## 2017-12-09 ENCOUNTER — Ambulatory Visit (HOSPITAL_COMMUNITY): Payer: Medicare Other

## 2017-12-09 DIAGNOSIS — M6281 Muscle weakness (generalized): Secondary | ICD-10-CM | POA: Diagnosis not present

## 2017-12-09 DIAGNOSIS — R262 Difficulty in walking, not elsewhere classified: Secondary | ICD-10-CM

## 2017-12-09 DIAGNOSIS — M25561 Pain in right knee: Secondary | ICD-10-CM | POA: Diagnosis not present

## 2017-12-09 DIAGNOSIS — M25661 Stiffness of right knee, not elsewhere classified: Secondary | ICD-10-CM | POA: Diagnosis not present

## 2017-12-09 DIAGNOSIS — R600 Localized edema: Secondary | ICD-10-CM | POA: Diagnosis not present

## 2017-12-09 NOTE — Therapy (Signed)
[place], [place], [address] Phone: [phone]   Fax:  [phone]  Physical Therapy Treatment  Patient Details  Name: [name] MRN: [id number] Date of Birth: 12-Mar-1945 Referring Provider (PT): Netta Cedars, MD   Encounter Date: 12/09/2017  PT End of Session - 12/09/17 1306    Visit Number  17    Number of Visits  19    Date for PT Re-Evaluation  12/14/17   Minireassessment complete 11/23/17   Authorization Type  Medicare (Secondary: [employer])    Authorization Time Period  11/02/17 to 12/14/17    Authorization - Visit Number  31    Authorization - Number of Visits  60    PT Start Time  1301   4' on bike, not included wiht charges   PT Stop Time  1348    PT Time Calculation (min)  47 min    Activity Tolerance  Patient tolerated treatment well    Behavior During Therapy  [facility] for tasks assessed/performed       Past Medical History:  Diagnosis Date  . Allergy   . Arthritis   . Contact dermatitis and other eczema, due to unspecified cause   . Depression   . Encounter for long-term (current) use of other medications   . Hyperlipidemia   . Other and unspecified hyperlipidemia   . Pain in joint, ankle and foot   . Screening for malignant neoplasm of the cervix     Past Surgical History:  Procedure Laterality Date  . ANKLE SURGERY  05/2006   Right Ankle  . Carpel TUnnel Release  [id number]   [id number]), Left(2006)  . CATARACT EXTRACTION Left 10/03/14  . CATARACT EXTRACTION Right 11/16/2014  . COLONOSCOPY    . DIAGNOSTIC LAPAROSCOPY  1980  . JOINT REPLACEMENT     Total Knee(L)-11/05/07  . KNEE ARTHROPLASTY  05/2007   Left  . RHINOPLASTY  1969  . TONSILLECTOMY     @ 72 y.o.  . TOTAL ANKLE ARTHROPLASTY  10/30/2011   Procedure: TOTAL ANKLE ARTHOPLASTY;  Surgeon: Wylene Simmer, MD;  Location: [facility];  Service: Orthopedics;  Laterality: Right;  RIGHT TOTAL ANKLE REPLACEMENT, POSSIBLE GASTROC RESESSION  .  TOTAL KNEE ARTHROPLASTY Right 10/30/2017   Procedure: RIGHT TOTAL KNEE ARTHROPLASTY;  Surgeon: Netta Cedars, MD;  Location: [facility];  Service: Orthopedics;  Laterality: Right;  . TUBAL LIGATION  1980    There were no vitals filed for this visit.  Subjective Assessment - 12/09/17 1303    Subjective  Pt stated she saw knee doctor earlier and stated he is not really concerned with the ankle pain.  Encouraged pt to return to water aerobics and increase walking with heel strike.      Pertinent History  TKA on 10/30/17    Patient Stated Goals  rehab the knee where she can bend and straighten it and walk wihtout assistance    Currently in Pain?  Yes    Pain Score  1     Pain Location  Knee    Pain Orientation  Right    Pain Descriptors / Indicators  Sore;Tightness    Pain Type  Surgical pain    Pain Onset  1 to 4 weeks ago    Pain Frequency  Constant    Aggravating Factors   walking, WB, being on it too long    Pain Relieving Factors  rest, ice, elevations, medicine    Effect of Pain on Daily  Activities  increases                       [facility] Adult PT Treatment/Exercise - 12/09/17 0001      Knee/Hip Exercises: Stretches   Passive Hamstring Stretch  Right;3 reps;30 seconds    Passive Hamstring Stretch Limitations  standing, 12" step    Knee: Self-Stretch to increase Flexion  Right;10 seconds    Knee: Self-Stretch Limitations  x10 reps, standing, 12" step    Gastroc Stretch  Both;3 reps;30 seconds    Gastroc Stretch Limitations  slant board      Knee/Hip Exercises: Aerobic   Stationary Bike  x4 mins, rocking and fwd full revolution for mobility, seat 6      Knee/Hip Exercises: Standing   Heel Raises  Both;20 reps    Heel Raises Limitations  heel and toe    Forward Lunges  Both;15 reps    Forward Lunges Limitations  on floor    Lateral Step Up  Right;15 reps;Step Height: 6";Hand Hold: 0    Lateral Step Up Limitations  minor discomfort    Forward Step Up  Right;15  reps;Step Height: 6";Hand Hold: 0    Step Down  Right;15 reps;Step Height: 6";Hand Hold: 2    SLS  Lt 15", Rt 3"      Knee/Hip Exercises: Seated   Stool Scoot - Round Trips  fwd/retro x2RT RLE only      Knee/Hip Exercises: Supine   Knee Extension Limitations  4    Knee Flexion Limitations  118      Manual Therapy   Manual Therapy  Edema management;Joint mobilization;Soft tissue mobilization    Manual therapy comments  completed separate rest of session    Edema Management  retro massage BLE elevated to decrease edema and pain    Joint Mobilization  AP and PA tibiofemoral joint mobs for flexion and extension; tibial ER at end range extension for extension ROM    Soft tissue mobilization  to hamstrings to reduce tension in prone knee hang               PT Short Term Goals - 11/23/17 1305      PT SHORT TERM GOAL #1   Title  Pt will have improved R knee AROM from 5-90deg in order to decrease pain and maximize gait.    Baseline  10/14: 8-107deg    Time  3    Period  Weeks    Status  Partially Met      PT SHORT TERM GOAL #2   Title  Pt will have decreased edema by 2cm or > in order to maximize R knee AROM.     Time  3    Period  Weeks    Status  Achieved      PT SHORT TERM GOAL #3   Title  Pt will have 1/2 grade improvement in MMT throughout in order to maximize balance and functional mobility.    Baseline  10/14: see MMT    Time  3    Period  Weeks    Status  Partially Met        PT Long Term Goals - 11/23/17 1305      PT LONG TERM GOAL #1   Title  Pt will have improved R knee AROM from 0-125deg in order to further maximize gait and stair ambulation as well as floor transfers.    Baseline  10/14: 8-107deg    Time  6    Period  Weeks    Status  On-going      PT LONG TERM GOAL #2   Title  Pt will have 1 grade improvement in MMT throughout in order to further maximize gait, stair ambulation, and floor transfers.    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    Baseline  10/14: R: 6 sec, L: 12sec    Time  6    Period  Weeks    Status  On-going      PT LONG TERM GOAL #5   Title  Pt will have improved 5xSTS to 12 sec or < with proper form in order to demo improved functional strength and maximize her functional mobility.    Baseline  10/14: 13sec RLE, extended    Time  6    Period  Weeks    Status  On-going            Plan - 12/09/17 1543    Clinical Impression Statement  Continued session focus on knee mobility and strengthening.  Pt continues to c/o nerve burning and pain in ankle affecting gait mechanics and weight bearing, pt. plans to see ankle surgeon in December to discuss.  Added manual to hamstrings during prone knee hang to address soft tissue restrictions in hamstrings.  EOS with manual joint mobs and retrograde massage for edema and pain control.  AROM at 4-118 degrees.    Rehab Potential  Good    PT Frequency  3x / week    PT Duration  6 weeks    PT Treatment/Interventions  ADLs/Self Care Home Management;Aquatic Therapy;Cryotherapy;Electrical Stimulation;Moist Heat;Ultrasound;DME Instruction;Gait training;Stair training;Functional mobility training;Therapeutic activities;Therapeutic exercise;Balance training;Neuromuscular re-education;Patient/family education;Orthotic Fit/Training;Manual techniques;Manual lymph drainage;Scar mobilization;Dry needling;Passive range of motion;Energy  conservation;Taping;Spinal Manipulations;Joint Manipulations    PT Next Visit Plan  Continue focus on R knee AROM, especially extension, manual to reduce adhesions at patellar tendon, and cotninue with strengthening as tolerated; Address floor transfers once ROM normalized and strength starts improving    PT Home Exercise Plan  eval: quad sets, heel slides, supine HS stretch; 10/2: bone foam for extension; 10/28: prone knee hang, SAQ, SLR, TKE, heel and toe raises       Patient will benefit from skilled therapeutic intervention in order to improve the following deficits and impairments:  Abnormal gait, Decreased activity tolerance, Decreased balance, Decreased endurance, Decreased mobility, Decreased range of motion, Decreased scar mobility, Decreased strength, Difficulty walking, Hypomobility, Increased edema, Increased fascial restricitons, Impaired flexibility, Improper body mechanics, Pain  Visit Diagnosis: Acute pain of right knee  Muscle weakness (generalized)  Difficulty in walking, not elsewhere classified  Localized edema     Problem List Patient Active Problem List  Diagnosis Date Noted  . Status post total knee replacement, right 10/30/2017  . Obesity (BMI 30.0-34.9) 04/30/2017  . 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 Vaginal dryness, menopausal 08/21/2014  . Osteopenia 12/14/2013  . Essential hypertension 12/14/2013  . Complex regional pain syndrome of lower limb 10/04/2012  . Hyperlipidemia 10/04/2012  . Allergic rhinitis 10/04/2012  . Generalized osteoarthrosis, involving multiple sites 10/04/2012   Ihor Austin, [place]; Burna  Aldona Lento 12/09/2017, 3:52 PM  [facility] [address] [place], [place], [address] Phone: [phone]   Fax:  [phone]  Name: [name] MRN: [id number] Date of Birth: 05/01/1945

## 2017-12-11 ENCOUNTER — Encounter (HOSPITAL_COMMUNITY): Payer: Medicare Other

## 2017-12-11 ENCOUNTER — Encounter (HOSPITAL_COMMUNITY): Payer: Self-pay

## 2017-12-11 ENCOUNTER — Ambulatory Visit (HOSPITAL_COMMUNITY): Payer: Medicare Other | Attending: Orthopedic Surgery

## 2017-12-11 DIAGNOSIS — R600 Localized edema: Secondary | ICD-10-CM | POA: Insufficient documentation

## 2017-12-11 DIAGNOSIS — M25561 Pain in right knee: Secondary | ICD-10-CM | POA: Diagnosis not present

## 2017-12-11 DIAGNOSIS — M6281 Muscle weakness (generalized): Secondary | ICD-10-CM | POA: Insufficient documentation

## 2017-12-11 DIAGNOSIS — M25661 Stiffness of right knee, not elsewhere classified: Secondary | ICD-10-CM

## 2017-12-11 DIAGNOSIS — R262 Difficulty in walking, not elsewhere classified: Secondary | ICD-10-CM | POA: Insufficient documentation

## 2017-12-11 NOTE — Therapy (Signed)
[place], [place], [address] Phone: [phone]   Fax:  [phone]  Physical Therapy Treatment  Patient Details  Name: [name] MRN: [id number] Date of Birth: 1945/11/11 Referring Provider (PT): Netta Cedars, MD   Encounter Date: 12/11/2017  PT End of Session - 12/11/17 1300    Visit Number  18    Number of Visits  19    Date for PT Re-Evaluation  12/14/17   Minireassessment complete 11/23/17   Authorization Type  Medicare (Secondary: [employer])    Authorization Time Period  11/02/17 to 12/14/17    Authorization - Visit Number  24    Authorization - Number of Visits  60    PT Start Time  7588    PT Stop Time  1338    PT Time Calculation (min)  41 min    Activity Tolerance  Patient tolerated treatment well    Behavior During Therapy  [facility] for tasks assessed/performed       Past Medical History:  Diagnosis Date  . Allergy   . Arthritis   . Contact dermatitis and other eczema, due to unspecified cause   . Depression   . Encounter for long-term (current) use of other medications   . Hyperlipidemia   . Other and unspecified hyperlipidemia   . Pain in joint, ankle and foot   . Screening for malignant neoplasm of the cervix     Past Surgical History:  Procedure Laterality Date  . ANKLE SURGERY  05/2006   Right Ankle  . Carpel TUnnel Release  [id number]   [id number]), Left(2006)  . CATARACT EXTRACTION Left 10/03/14  . CATARACT EXTRACTION Right 11/16/2014  . COLONOSCOPY    . DIAGNOSTIC LAPAROSCOPY  1980  . JOINT REPLACEMENT     Total Knee(L)-11/05/07  . KNEE ARTHROPLASTY  05/2007   Left  . RHINOPLASTY  1969  . TONSILLECTOMY     @ 72 y.o.  . TOTAL ANKLE ARTHROPLASTY  10/30/2011   Procedure: TOTAL ANKLE ARTHOPLASTY;  Surgeon: Wylene Simmer, MD;  Location: [facility];  Service: Orthopedics;  Laterality: Right;  RIGHT TOTAL ANKLE REPLACEMENT, POSSIBLE GASTROC RESESSION  . TOTAL KNEE ARTHROPLASTY Right 10/30/2017    Procedure: RIGHT TOTAL KNEE ARTHROPLASTY;  Surgeon: Netta Cedars, MD;  Location: [facility];  Service: Orthopedics;  Laterality: Right;  . TUBAL LIGATION  1980    There were no vitals filed for this visit.  Subjective Assessment - 12/11/17 1300    Subjective  Pt states that Dr. Veverly Fells told her to start back wiht her water aerobics so she went this AM and she is really sore. No pain, just mm soreness.     Pertinent History  TKA on 10/30/17    Patient Stated Goals  rehab the knee where she can bend and straighten it and walk wihtout assistance    Currently in Pain?  Yes    Pain Score  3     Pain Location  Knee    Pain Orientation  Right    Pain Descriptors / Indicators  Tightness    Pain Type  Surgical pain    Pain Onset  1 to 4 weeks ago    Pain Frequency  Constant    Aggravating Factors   walking, WB, being on it too long    Pain Relieving Factors  rest, ice, elevation, medicine    Effect of Pain on Daily Activities  increases  [facility] Adult PT Treatment/Exercise - 12/11/17 0001      Exercises   Exercises  Knee/Hip      Knee/Hip Exercises: Stretches   Passive Hamstring Stretch  Right;3 reps;30 seconds    Passive Hamstring Stretch Limitations  standing, 12" step    Knee: Self-Stretch to increase Flexion  Right;10 seconds    Knee: Self-Stretch Limitations  x10 reps, standing, 12" step    Gastroc Stretch  Both;3 reps;30 seconds    Gastroc Stretch Limitations  slant board      Knee/Hip Exercises: Aerobic   Stationary Bike  x4 mins, rocking and fwd full revolution for mobility, seat 6      Knee/Hip Exercises: Standing   Knee Flexion  Right;20 reps    Forward Lunges  Both;15 reps    Forward Lunges Limitations  on floor    Lateral Step Up  Right;15 reps;Step Height: 6";Hand Hold: 0    Lateral Step Up Limitations  minor discomfort    Forward Step Up  Right;15 reps;Step Height: 6";Hand Hold: 0      Knee/Hip Exercises: Seated   Stool Scoot - Round Trips  fwd/retro  x2RT RLE only      Knee/Hip Exercises: Supine   Knee Extension Limitations  3    Knee Flexion Limitations  115      Manual Therapy   Manual Therapy  Edema management;Joint mobilization    Manual therapy comments  completed separate rest of session    Edema Management  retro massage BLE elevated to decrease edema and pain    Joint Mobilization  AP and PA tibiofemoral joint mobs for flexion and extension; tibial ER at end range extension for extension ROM             PT Education - 12/11/17 1300    Education Details  exercise technique, continue HEP    Person(s) Educated  Patient    Methods  Explanation;Demonstration    Comprehension  Verbalized understanding;Returned demonstration       PT Short Term Goals - 11/23/17 1305      PT SHORT TERM GOAL #1   Title  Pt will have improved R knee AROM from 5-90deg in order to decrease pain and maximize gait.    Baseline  10/14: 8-107deg    Time  3    Period  Weeks    Status  Partially Met      PT SHORT TERM GOAL #2   Title  Pt will have decreased edema by 2cm or > in order to maximize R knee AROM.     Time  3    Period  Weeks    Status  Achieved      PT SHORT TERM GOAL #3   Title  Pt will have 1/2 grade improvement in MMT throughout in order to maximize balance and functional mobility.    Baseline  10/14: see MMT    Time  3    Period  Weeks    Status  Partially Met        PT Long Term Goals - 11/23/17 1305      PT LONG TERM GOAL #1   Title  Pt will have improved R knee AROM from 0-125deg in order to further maximize gait and stair ambulation as well as floor transfers.    Baseline  10/14: 8-107deg    Time  6    Period  Weeks    Status  On-going      PT LONG TERM GOAL #  2   Title  Pt will have 1 grade improvement in MMT throughout in order to further maximize gait, stair ambulation, and floor transfers.    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    Baseline  10/14: R: 6 sec, L: 12sec    Time  6    Period  Weeks    Status  On-going      PT LONG TERM GOAL #5   Title  Pt will have improved 5xSTS to 12 sec or < with proper form in order to demo improved functional strength and maximize her functional mobility.    Baseline  10/14: 13sec RLE, extended    Time  6    Period  Weeks    Status  On-going            Plan - 12/11/17 1338    Clinical Impression Statement  Session slightly limited today as she was so sore from her water aerobics class earlier today. PT educated to perform her water aerobics on days that she doesn't come to therapy, however, she states that they meet MWF, the same days as her therapy. Performed all therex within her pain tolerance; did not progress her since she was so sore. Ended with manual for joint mobility and STM for pain control. AROM 3 to 115deg; was 4-118deg last session and PT feels that this slight decrease in flexion is due to her soreness from water aerobics. Pt due for reassessment next visit.     Rehab Potential  Good    PT Frequency  3x / week    PT Duration  6 weeks    PT Treatment/Interventions  ADLs/Self Care Home Management;Aquatic Therapy;Cryotherapy;Electrical Stimulation;Moist Heat;Ultrasound;DME Instruction;Gait training;Stair training;Functional mobility training;Therapeutic activities;Therapeutic exercise;Balance training;Neuromuscular re-education;Patient/family education;Orthotic Fit/Training;Manual techniques;Manual  lymph drainage;Scar mobilization;Dry needling;Passive range of motion;Energy conservation;Taping;Spinal Manipulations;Joint Manipulations    PT Next Visit Plan  reassessment; Continue focus on R knee AROM, especially extension, manual to reduce adhesions at patellar tendon, and cotninue with strengthening as tolerated; Address floor transfers once ROM normalized and strength starts improving    PT Home Exercise Plan  eval: quad sets, heel slides, supine HS stretch; 10/2: bone foam for extension; 10/28: prone knee hang, SAQ, SLR, TKE, heel and toe raises    Consulted and Agree with Plan of Care  Patient       Patient will benefit from skilled therapeutic intervention in order to improve the following deficits and impairments:  Abnormal gait, Decreased activity tolerance, Decreased balance, Decreased endurance, Decreased mobility, Decreased range of motion, Decreased scar mobility, Decreased strength, Difficulty walking, Hypomobility, Increased edema, Increased fascial restricitons, Impaired flexibility, Improper body mechanics, Pain  Visit Diagnosis: Acute pain of right knee  Muscle weakness (generalized)  Difficulty in walking, not elsewhere classified  Localized edema  Stiffness of right knee, not elsewhere classified     Problem List Patient Active Problem List   Diagnosis Date Noted  . Status post total knee replacement, right 10/30/2017  . Obesity (BMI 30.0-34.9) 04/30/2017  . 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 Vaginal dryness, menopausal 08/21/2014  . Osteopenia 12/14/2013  . Essential hypertension 12/14/2013  . Complex regional pain syndrome of lower limb 10/04/2012  . Hyperlipidemia 10/04/2012  . Allergic rhinitis 10/04/2012  . Generalized osteoarthrosis, involving multiple sites 10/04/2012        Geraldine Solar PT, DPT  [facility] [address] [place], [place], [address] Phone: [phone]   Fax:   [phone]  Name: [name] MRN: [id number] Date of Birth: 11-27-1945

## 2017-12-13 DIAGNOSIS — Z23 Encounter for immunization: Secondary | ICD-10-CM | POA: Diagnosis not present

## 2017-12-14 ENCOUNTER — Ambulatory Visit (HOSPITAL_COMMUNITY): Payer: Medicare Other

## 2017-12-14 ENCOUNTER — Encounter (HOSPITAL_COMMUNITY): Payer: Self-pay

## 2017-12-14 DIAGNOSIS — M6281 Muscle weakness (generalized): Secondary | ICD-10-CM

## 2017-12-14 DIAGNOSIS — R262 Difficulty in walking, not elsewhere classified: Secondary | ICD-10-CM | POA: Diagnosis not present

## 2017-12-14 DIAGNOSIS — M25561 Pain in right knee: Secondary | ICD-10-CM

## 2017-12-14 DIAGNOSIS — R600 Localized edema: Secondary | ICD-10-CM

## 2017-12-14 DIAGNOSIS — M25661 Stiffness of right knee, not elsewhere classified: Secondary | ICD-10-CM | POA: Diagnosis not present

## 2017-12-14 NOTE — Therapy (Signed)
[facility] [place], [place], [address] Phone: [phone]   Fax:  [phone]   Progress Note Reporting Period 11/23/17 to 12/14/17  See note below for Objective Data and Assessment of Progress/Goals.    Physical Therapy Treatment  Patient Details  Name: [name] MRN: [id number] Date of Birth: October 23, 1945 Referring Provider (PT): Netta Cedars, MD   Encounter Date: 12/14/2017  PT End of Session - 12/14/17 1121    Visit Number  19    Number of Visits  27    Date for PT Re-Evaluation  01/11/18   Minireassessment complete 11/23/17   Authorization Type  Medicare (Secondary: [employer])    Authorization Time Period  11/02/17 to 12/14/17; NEW: 12/14/17 to 01/11/18    Authorization - Visit Number  62    Authorization - Number of Visits  60    PT Start Time  2122    PT Stop Time  1157    PT Time Calculation (min)  40 min    Equipment Utilized During Treatment  Gait belt    Activity Tolerance  Patient tolerated treatment well    Behavior During Therapy  WFL for tasks assessed/performed       Past Medical History:  Diagnosis Date  . Allergy   . Arthritis   . Contact dermatitis and other eczema, due to unspecified cause   . Depression   . Encounter for long-term (current) use of other medications   . Hyperlipidemia   . Other and unspecified hyperlipidemia   . Pain in joint, ankle and foot   . Screening for malignant neoplasm of the cervix     Past Surgical History:  Procedure Laterality Date  . ANKLE SURGERY  05/2006   Right Ankle  . Carpel TUnnel Release  [id number]   [id number]), Left(2006)  . CATARACT EXTRACTION Left 10/03/14  . CATARACT EXTRACTION Right 11/16/2014  . COLONOSCOPY    . DIAGNOSTIC LAPAROSCOPY  1980  . JOINT REPLACEMENT     Total Knee(L)-11/05/07  . KNEE ARTHROPLASTY  05/2007   Left  . RHINOPLASTY  1969  . TONSILLECTOMY     @ 72 y.o.  . TOTAL ANKLE ARTHROPLASTY  10/30/2011   Procedure: TOTAL  ANKLE ARTHOPLASTY;  Surgeon: Wylene Simmer, MD;  Location: [facility];  Service: Orthopedics;  Laterality: Right;  RIGHT TOTAL ANKLE REPLACEMENT, POSSIBLE GASTROC RESESSION  . TOTAL KNEE ARTHROPLASTY Right 10/30/2017   Procedure: RIGHT TOTAL KNEE ARTHROPLASTY;  Surgeon: Netta Cedars, MD;  Location: [facility];  Service: Orthopedics;  Laterality: Right;  . TUBAL LIGATION  1980    There were no vitals filed for this visit.  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 She is sore and tight today and thinks it is from that.     Pertinent History  TKA on 10/30/17    Patient Stated Goals  rehab the knee where she can bend and straighten it and walk wihtout assistance    Currently in Pain?  No/denies    Pain Onset  1 to 4 weeks ago         [facility] PT Assessment - 12/14/17 0001      Assessment   Medical Diagnosis  R TKA    Referring Provider (PT)  Netta Cedars, MD    Onset Date/Surgical Date  10/30/17    Hand Dominance  Left    Next MD Visit  01/20/18    Prior Therapy  OPPT for R arthroscopic surgery prior to TKA      Circumferential Edema   Circumferential - Right  35cm, joint line      AROM   Right Knee Extension  5   was 6   Right Knee Flexion  114   was 108     Strength   Right Hip Flexion  5/5   was 4+   Right Hip Extension  4-/5   was 3+   Right Hip ABduction  4/5   was 4-   Left Hip Extension  4/5   was 4   Left Hip ABduction  4/5   was 4   Right Knee Flexion  5/5   was 4+   Right Knee Extension  4+/5   was 4+   Right Ankle Dorsiflexion  5/5   wa 4+     Ambulation/Gait   Ambulation Distance (Feet)  552 Feet   3MWT; was 394 with SPC   Assistive device  None    Gait Pattern  Step-through pattern;Decreased stance time - right;Decreased step length - left;Decreased dorsiflexion - right;Antalgic;Lateral trunk lean to left   R ankle ER, limp on R     Balance   Balance Assessed  Yes      Static  Standing Balance   Static Standing - Balance Support  No upper extremity supported    Static Standing Balance -  Activities   Single Leg Stance - Right Leg;Single Leg Stance - Left Leg    Static Standing - Comment/# of Minutes  R: 5.7sec or < L: 38sec   was 6sec RLE, 12.5sec on L     Standardized Balance Assessment   Standardized Balance Assessment  Five Times Sit to Stand    Five times sit to stand comments   9.93sec, weight shifted off RLE   was 13.2 sec, weight shifted off RLE, no UE            [facility] Adult PT Treatment/Exercise - 12/14/17 0001      Knee/Hip Exercises: Aerobic   Stationary Bike  x4 mins, rocking and fwd full revolution for mobility, seat 6      Manual Therapy   Manual Therapy  Edema management    Manual therapy comments  completed separate rest of session    Edema Management  retro massage BLE elevated to decrease edema and pain           PT Education - 12/14/17 1121    Education Details  reassessment findings, continue HEP    Person(s) Educated  Patient    Methods  Explanation;Demonstration    Comprehension  Verbalized understanding;Returned demonstration       PT Short Term Goals - 12/14/17 1122      PT SHORT TERM GOAL #1   Title  Pt will have improved R knee AROM from 5-90deg in order to decrease pain and maximize gait.    Baseline  11/4: 5-114deg    Time  3    Period  Weeks    Status  Achieved      PT SHORT TERM GOAL #2   Title  Pt will have decreased edema by 2cm or > in order to maximize R knee AROM.     Time  3    Period  Weeks    Status  Achieved      PT SHORT TERM GOAL #3   Title  Pt will have 1/2 grade improvement in  MMT throughout in order to maximize balance and functional mobility.    Baseline  11/4: see MMT    Time  3    Period  Weeks    Status  Partially Met        PT Long Term Goals - 12/14/17 1122      PT LONG TERM GOAL #1   Title  Pt will have improved R knee AROM from 0-125deg in order to further maximize gait  and stair ambulation as well as floor transfers.    Baseline  11/4: 5-114deg    Time  6    Period  Weeks    Status  On-going      PT LONG TERM GOAL #2   Title  Pt will have 1 grade improvement in MMT throughout in order to further maximize gait, stair ambulation, and floor transfers.    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    Baseline  11/4: R: 5.7 sec, L: 38sec    Time  6    Period  Weeks    Status  Partially Met      PT LONG TERM GOAL #5   Title  Pt will have improved 5xSTS to 12 sec or < with proper form in order to demo improved functional strength and maximize her functional mobility.    Baseline  11/4: 9.9sec, decreased weigth shift off RLE    Time  6    Period  Weeks    Status  Partially Met            Plan - 12/14/17 1204    Clinical Impression Statement  PT reassessed pt's goals and outcome measures this date. Pt has objectively made good progress overall as illustrated by all goals either partially met or met, with the exception of her long-term AROM goal still on-going. Her MMT has improved throughout in almost all mm groups, her SLS on the LLE improved, and her functional strength improved AEB 5xSTS time. She continues with gait deviations and these were amplified today because she left her cane in her car and wished to perform 3MWT without AD; mainly limited by antalgia/limp on RLE throughout. Her AROM was 4-114deg by  EOS this date and she was noted to have very minimal increase in joint line edema today, however, she had increased activity levels over the weekend. Overall, pt has progressed nicely but needs continued skilled PT intervention to address remaining impairments in order to further improve AROM, gait, balance, and return to PLOF with reduced risk for increasing pain. PT recommends reducing therapy frequency to 2x/week for 4 more weeks and pt agreeable to this plan.     Rehab Potential  Good    PT Frequency  2x / week    PT Duration  4 weeks    PT Treatment/Interventions  ADLs/Self Care Home Management;Aquatic Therapy;Cryotherapy;Electrical Stimulation;Moist Heat;Ultrasound;DME Instruction;Gait training;Stair training;Functional mobility training;Therapeutic activities;Therapeutic exercise;Balance training;Neuromuscular re-education;Patient/family education;Orthotic Fit/Training;Manual techniques;Manual lymph drainage;Scar mobilization;Dry needling;Passive range of motion;Energy conservation;Taping;Spinal Manipulations;Joint Manipulations    PT Next Visit Plan  Continue focus on R knee AROM, especially extension, manual to reduce adhesions at patellar tendon, and cotninue with strengthening as tolerated; Address floor transfers once ROM normalized and strength starts improving    PT Home Exercise Plan  eval: quad sets, heel slides, supine HS stretch; 10/2: bone foam for extension; 10/28: prone knee hang, SAQ, SLR, TKE, heel and toe raises    Consulted and Agree with Plan of Care  Patient       Patient will benefit from skilled therapeutic intervention in order to improve the following deficits and impairments:  Abnormal gait, Decreased activity tolerance, Decreased balance, Decreased endurance, Decreased mobility, Decreased range of motion, Decreased scar mobility, Decreased strength, Difficulty walking, Hypomobility, Increased edema, Increased fascial restricitons, Impaired flexibility, Improper body  mechanics, Pain  Visit Diagnosis: Acute pain of right knee - Plan: PT plan of care cert/re-cert  Muscle weakness (generalized) - Plan: PT plan of care cert/re-cert  Difficulty in walking, not elsewhere classified - Plan: PT plan of care cert/re-cert  Localized edema - Plan: PT plan of care cert/re-cert     Problem List Patient Active Problem List   Diagnosis Date Noted  . Status post total knee replacement, right 10/30/2017  . Obesity (BMI 30.0-34.9) 04/30/2017  . 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 Vaginal dryness, menopausal 08/21/2014  . Osteopenia 12/14/2013  . Essential hypertension 12/14/2013  . Complex regional pain syndrome of lower limb 10/04/2012  . Hyperlipidemia 10/04/2012  . Allergic rhinitis 10/04/2012  . Generalized osteoarthrosis, involving multiple sites 10/04/2012        [name] PT, DPT  [facility] [address] [place], [place], [address] Phone: [phone]   Fax:  [phone]  Name: [name] MRN: [id number] Date of Birth: 1945-07-19

## 2017-12-17 ENCOUNTER — Ambulatory Visit (HOSPITAL_COMMUNITY): Payer: Medicare Other

## 2017-12-17 ENCOUNTER — Encounter (HOSPITAL_COMMUNITY): Payer: Self-pay

## 2017-12-17 DIAGNOSIS — R600 Localized edema: Secondary | ICD-10-CM | POA: Diagnosis not present

## 2017-12-17 DIAGNOSIS — R262 Difficulty in walking, not elsewhere classified: Secondary | ICD-10-CM | POA: Diagnosis not present

## 2017-12-17 DIAGNOSIS — M25561 Pain in right knee: Secondary | ICD-10-CM

## 2017-12-17 DIAGNOSIS — M6281 Muscle weakness (generalized): Secondary | ICD-10-CM

## 2017-12-17 DIAGNOSIS — M25661 Stiffness of right knee, not elsewhere classified: Secondary | ICD-10-CM | POA: Diagnosis not present

## 2017-12-17 NOTE — Therapy (Signed)
[facility] [place], [place], [address] Phone: [phone]   Fax:  [phone]  Physical Therapy Treatment  Patient Details  Name: [name] MRN: [id number] Date of Birth: 15-Mar-1945 Referring Provider (PT): Netta Cedars, MD   Encounter Date: 12/17/2017  PT End of Session - 12/17/17 0815    Visit Number  20    Number of Visits  27    Date for PT Re-Evaluation  01/11/18   Minireassessment complete 11/23/17   Authorization Type  Medicare (Secondary: [employer])    Authorization Time Period  11/02/17 to 12/14/17; NEW: 12/14/17 to 01/11/18    Authorization - Visit Number  41    Authorization - Number of Visits  60    PT Start Time  0817    PT Stop Time  0857    PT Time Calculation (min)  40 min    Equipment Utilized During Treatment  Gait belt    Activity Tolerance  Patient tolerated treatment well    Behavior During Therapy  [facility] for tasks assessed/performed       Past Medical History:  Diagnosis Date  . Allergy   . Arthritis   . Contact dermatitis and other eczema, due to unspecified cause   . Depression   . Encounter for long-term (current) use of other medications   . Hyperlipidemia   . Other and unspecified hyperlipidemia   . Pain in joint, ankle and foot   . Screening for malignant neoplasm of the cervix     Past Surgical History:  Procedure Laterality Date  . ANKLE SURGERY  05/2006   Right Ankle  . Carpel TUnnel Release  [id number]   [id number]), Left(2006)  . CATARACT EXTRACTION Left 10/03/14  . CATARACT EXTRACTION Right 11/16/2014  . COLONOSCOPY    . DIAGNOSTIC LAPAROSCOPY  1980  . JOINT REPLACEMENT     Total Knee(L)-11/05/07  . KNEE ARTHROPLASTY  05/2007   Left  . RHINOPLASTY  1969  . TONSILLECTOMY     @ 72 y.o.  . TOTAL ANKLE ARTHROPLASTY  10/30/2011   Procedure: TOTAL ANKLE ARTHOPLASTY;  Surgeon: Wylene Simmer, MD;  Location: [facility];  Service: Orthopedics;  Laterality: Right;  RIGHT TOTAL ANKLE  REPLACEMENT, POSSIBLE GASTROC RESESSION  . TOTAL KNEE ARTHROPLASTY Right 10/30/2017   Procedure: RIGHT TOTAL KNEE ARTHROPLASTY;  Surgeon: Netta Cedars, MD;  Location: [facility];  Service: Orthopedics;  Laterality: Right;  . TUBAL LIGATION  1980    There were no vitals filed for this visit.  Subjective Assessment - 12/17/17 0816    Subjective  Pt reports that her ankle is really bothering her and she is going to try and get in with her ankle MD soon. She thinks her limping is from the ankle, not her knee. No pain in the ankle, just discomfort.     Pertinent History  TKA on 10/30/17    Patient Stated Goals  rehab the knee where she can bend and straighten it and walk wihtout assistance    Currently in Pain?  No/denies    Pain Onset  1 to 4 weeks ago         [facility] Adult PT Treatment/Exercise - 12/17/17 0001      Exercises   Exercises  Knee/Hip      Knee/Hip Exercises: Stretches   Passive Hamstring Stretch  Right;3 reps;30 seconds    Passive Hamstring Stretch Limitations  standing, 12" step    Knee: Self-Stretch to increase Flexion  --  Knee: Self-Stretch Limitations  --    Gastroc Stretch  Both;3 reps;30 seconds    Gastroc Stretch Limitations  slant board      Knee/Hip Exercises: Aerobic   Stationary Bike  x4 mins, rocking and fwd full revolution for mobility, seat 5      Knee/Hip Exercises: Standing   Forward Lunges  Both;10 reps    Forward Lunges Limitations  bosu, dome up    Lateral Step Up  Right;10 reps;Hand Hold: 2    Lateral Step Up Limitations  bosu, dome up    Forward Step Up  Right;10 reps;Hand Hold: 2    Forward Step Up Limitations  bosu, dome up    Stairs  x2RT 7" steps, pt felt comfortable and at baseline with this    SLS  RLE x5 reps max of 6"    Rebounder  bil tandem stance firm 3x10"; bil tandem stance firm +UE flexion 3# bar 2x10 each    Gait Training  fwd tandem gait 36f x2RT    Other Standing Knee Exercises  floor transfer x2RT       Knee/Hip Exercises:  Supine   Knee Extension Limitations  2    Knee Flexion Limitations  110      Manual Therapy   Manual Therapy  Joint mobilization;Myofascial release    Manual therapy comments  completed separate rest of session    Joint Mobilization  tibial ER in end range knee extension; patellar mobs alldirections for increased extension ROM    Myofascial Release  to distal quad and proximal scar               PT Short Term Goals - 12/14/17 1122      PT SHORT TERM GOAL #1   Title  Pt will have improved R knee AROM from 5-90deg in order to decrease pain and maximize gait.    Baseline  11/4: 5-114deg    Time  3    Period  Weeks    Status  Achieved      PT SHORT TERM GOAL #2   Title  Pt will have decreased edema by 2cm or > in order to maximize R knee AROM.     Time  3    Period  Weeks    Status  Achieved      PT SHORT TERM GOAL #3   Title  Pt will have 1/2 grade improvement in MMT throughout in order to maximize balance and functional mobility.    Baseline  11/4: see MMT    Time  3    Period  Weeks    Status  Partially Met        PT Long Term Goals - 12/14/17 1122      PT LONG TERM GOAL #1   Title  Pt will have improved R knee AROM from 0-125deg in order to further maximize gait and stair ambulation as well as floor transfers.    Baseline  11/4: 5-114deg    Time  6    Period  Weeks    Status  On-going      PT LONG TERM GOAL #2   Title  Pt will have 1 grade improvement in MMT throughout in order to further maximize gait, stair ambulation, and floor transfers.    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    Baseline  11/4: R: 5.7 sec, L: 38sec    Time  6    Period  Weeks    Status  Partially Met      PT LONG TERM GOAL #5   Title  Pt will have improved 5xSTS to 12 sec or < with proper form in order to demo improved functional strength and maximize her functional mobility.    Baseline  11/4: 9.9sec, decreased weigth shift off RLE    Time  6    Period  Weeks    Status  Partially Met            Plan - 12/17/17 0857    Clinical Impression Statement  Session limited this date due to R ankle discomfort; she is going to call her ankle MD today to try and get in with him regarding this as it is very concerning to her. Did not progress strengthening too much this date due to her ankle but did increase focus on balance. Pt tolerated well, not reporting any increased pain, she just had difficulty with balance on RLE. Addressed floor transfers this date since her knee flexion AROM [facility] but overall pt did well with it. Ended with manual joint mobs and MFR to distal quad and mild scar tissue. AROM 2 to 110deg this date. Continue as planned, progressing as able.     Rehab Potential  Good    PT Frequency  2x / week    PT Duration  4 weeks    PT Treatment/Interventions  ADLs/Self Care Home Management;Aquatic Therapy;Cryotherapy;Electrical Stimulation;Moist Heat;Ultrasound;DME Instruction;Gait training;Stair training;Functional mobility training;Therapeutic activities;Therapeutic exercise;Balance training;Neuromuscular re-education;Patient/family education;Orthotic Fit/Training;Manual techniques;Manual lymph drainage;Scar mobilization;Dry needling;Passive range of motion;Energy conservation;Taping;Spinal Manipulations;Joint Manipulations    PT Next Visit Plan  Continue focus on R knee AROM,  especially extension, manual to reduce adhesions at patellar tendon, and cotninue with strengthening as tolerated    PT Home Exercise Plan  eval: quad sets, heel slides, supine HS stretch; 10/2: bone foam for extension; 10/28: prone knee hang, SAQ, SLR, TKE, heel and toe raises    Consulted and Agree with Plan of Care  Patient       Patient will benefit from skilled therapeutic intervention in order to improve the following deficits and impairments:  Abnormal gait, Decreased activity tolerance, Decreased balance, Decreased endurance, Decreased mobility, Decreased range of motion, Decreased scar mobility, Decreased strength, Difficulty walking, Hypomobility, Increased edema, Increased fascial restricitons, Impaired flexibility, Improper body mechanics, Pain  Visit Diagnosis: Acute pain of right knee  Muscle weakness (generalized)  Difficulty in walking, not elsewhere classified  Localized edema     Problem List Patient Active Problem List   Diagnosis Date Noted  . Status post total knee replacement, right 10/30/2017  . Obesity (BMI 30.0-34.9) 04/30/2017  . 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  Vaginal dryness, menopausal 08/21/2014  . Osteopenia 12/14/2013  . Essential hypertension 12/14/2013  . Complex regional pain syndrome of lower limb 10/04/2012  . Hyperlipidemia 10/04/2012  . Allergic rhinitis 10/04/2012  . Generalized osteoarthrosis, involving multiple sites 10/04/2012       Geraldine Solar PT, DPT  [facility] [address] [place], [place], [address] Phone: [phone]   Fax:  [phone]  Name: [name] MRN: [id number] Date of Birth: Oct 02, 1945

## 2017-12-22 ENCOUNTER — Encounter (HOSPITAL_COMMUNITY): Payer: Self-pay

## 2017-12-22 ENCOUNTER — Ambulatory Visit (HOSPITAL_COMMUNITY): Payer: Medicare Other

## 2017-12-22 DIAGNOSIS — R262 Difficulty in walking, not elsewhere classified: Secondary | ICD-10-CM

## 2017-12-22 DIAGNOSIS — M6281 Muscle weakness (generalized): Secondary | ICD-10-CM

## 2017-12-22 DIAGNOSIS — M25561 Pain in right knee: Secondary | ICD-10-CM | POA: Diagnosis not present

## 2017-12-22 DIAGNOSIS — M25661 Stiffness of right knee, not elsewhere classified: Secondary | ICD-10-CM | POA: Diagnosis not present

## 2017-12-22 DIAGNOSIS — R600 Localized edema: Secondary | ICD-10-CM | POA: Diagnosis not present

## 2017-12-22 NOTE — Therapy (Signed)
[place], [place], [address] Phone: [phone]   Fax:  [phone]  Physical Therapy Treatment  Patient Details  Name: [name] MRN: [id number] Date of Birth: [birth year] Referring Provider (PT): Netta Cedars, MD   Encounter Date: 12/22/2017  PT End of Session - 12/22/17 0821    Visit Number  21    Number of Visits  27    Date for PT Re-Evaluation  01/11/18   Minireassess completed 11/23/2017   Authorization Type  Medicare (Secondary: [employer])    Authorization Time Period  11/02/17 to 12/14/17; NEW: 12/14/17 to 01/11/18    Authorization - Visit Number  84    Authorization - Number of Visits  60    PT Start Time  [phone]   4' on bike, not included with charges   PT Stop Time  0858    PT Time Calculation (min)  42 min    Equipment Utilized During Treatment  Gait belt   with balance activities   Activity Tolerance  Patient tolerated treatment well    Behavior During Therapy  WFL for tasks assessed/performed       Past Medical History:  Diagnosis Date  . Allergy   . Arthritis   . Contact dermatitis and other eczema, due to unspecified cause   . Depression   . Encounter for long-term (current) use of other medications   . Hyperlipidemia   . Other and unspecified hyperlipidemia   . Pain in joint, ankle and foot   . Screening for malignant neoplasm of the cervix     Past Surgical History:  Procedure Laterality Date  . ANKLE SURGERY  05/2006   Right Ankle  . Carpel TUnnel Release  [id number]   [id number]), Left(2006)  . CATARACT EXTRACTION Left 10/03/14  . CATARACT EXTRACTION Right 11/16/2014  . COLONOSCOPY    . DIAGNOSTIC LAPAROSCOPY  1980  . JOINT REPLACEMENT     Total Knee(L)-11/05/07  . KNEE ARTHROPLASTY  05/2007   Left  . RHINOPLASTY  1969  . TONSILLECTOMY     @ 72 y.o.  . TOTAL ANKLE ARTHROPLASTY  10/30/2011   Procedure: TOTAL ANKLE ARTHOPLASTY;  Surgeon: Wylene Simmer, MD;  Location: [facility];   Service: Orthopedics;  Laterality: Right;  RIGHT TOTAL ANKLE REPLACEMENT, POSSIBLE GASTROC RESESSION  . TOTAL KNEE ARTHROPLASTY Right 10/30/2017   Procedure: RIGHT TOTAL KNEE ARTHROPLASTY;  Surgeon: Netta Cedars, MD;  Location: [facility];  Service: Orthopedics;  Laterality: Right;  . TUBAL LIGATION  1980    There were no vitals filed for this visit.  Subjective Assessment - 12/22/17 0818    Subjective  Pt stated she feels all the limping is due to the ankle pain.  Has apt scheduled with ankle MD next week.  No reports of knee pain, just stiffness    Pertinent History  TKA on 10/30/17    Patient Stated Goals  rehab the knee where she can bend and straighten it and walk wihtout assistance    Currently in Pain?  Yes    Pain Score  1     Pain Location  Ankle    Pain Orientation  Right    Pain Descriptors / Indicators  Tightness;Discomfort   weakness, nerve damange   Pain Type  Surgical pain    Pain Onset  1 to 4 weeks ago    Pain Frequency  Constant    Aggravating Factors   walking, WB, being on it too long  Pain Relieving Factors  rest, ice, elevation, medicine    Effect of Pain on Daily Activities  increases                       [facility] Adult PT Treatment/Exercise - 12/22/17 0001      Exercises   Exercises  Knee/Hip      Knee/Hip Exercises: Aerobic   Stationary Bike  x4 mins, fwd full revolution for mobility, seat 5      Knee/Hip Exercises: Standing   Forward Lunges  Both;15 reps    Forward Lunges Limitations  bosu, dome up    Side Lunges  Both;10 reps    Side Lunges Limitations  cueing for mechanics    Lateral Step Up  Right;15 reps;Hand Hold: 2    Lateral Step Up Limitations  bosu, dome up    Forward Step Up  Right;15 reps;Hand Hold: 2    Forward Step Up Limitations  bosu, dome up    Stairs  2Rt 1HR ascending and 2HR descending    Gait Training  fwd tandem gait 66f x2RT; sidestep 2RT    Other Standing Knee Exercises  paloff in tandem stance on foam wiht RTB  15x       Knee/Hip Exercises: Supine   Terminal Knee Extension  AROM;Right;15 reps    Terminal Knee Extension Limitations  1/2 bolster under knee    Knee Extension  AROM    Knee Extension Limitations  2    Knee Flexion  AROM    Knee Flexion Limitations  114   was 110 last session     Knee/Hip Exercises: Prone   Other Prone Exercises  TKE 10x 3" holds      Manual Therapy   Manual Therapy  Joint mobilization;Myofascial release    Manual therapy comments  completed separate rest of session    Joint Mobilization  tibial ER in end range knee extension; patellar mobs alldirections for increased extension ROM    Myofascial Release  to distal quad and proximal scar               PT Short Term Goals - 12/14/17 1122      PT SHORT TERM GOAL #1   Title  Pt will have improved R knee AROM from 5-90deg in order to decrease pain and maximize gait.    Baseline  11/4: 5-114deg    Time  3    Period  Weeks    Status  Achieved      PT SHORT TERM GOAL #2   Title  Pt will have decreased edema by 2cm or > in order to maximize R knee AROM.     Time  3    Period  Weeks    Status  Achieved      PT SHORT TERM GOAL #3   Title  Pt will have 1/2 grade improvement in MMT throughout in order to maximize balance and functional mobility.    Baseline  11/4: see MMT    Time  3    Period  Weeks    Status  Partially Met        PT Long Term Goals - 12/14/17 1122      PT LONG TERM GOAL #1   Title  Pt will have improved R knee AROM from 0-125deg in order to further maximize gait and stair ambulation as well as floor transfers.    Baseline  11/4: 5-114deg    Time  6  Period  Weeks    Status  On-going      PT LONG TERM GOAL #2   Title  Pt will have 1 grade improvement in MMT throughout in order to further maximize gait, stair ambulation, and floor transfers.    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    Baseline  11/4: R: 5.7 sec, L: 38sec    Time  6    Period  Weeks    Status  Partially Met      PT LONG TERM GOAL #5   Title  Pt will have improved 5xSTS to 12 sec or < with proper form in order to demo improved functional strength and maximize her functional mobility.    Baseline  11/4: 9.9sec, decreased weigth shift off RLE    Time  6    Period  Weeks    Status  Partially Met            Plan - 12/22/17 0857    Clinical Impression Statement  Pt continues to ambulate with antalgic gait mechanics due to ankle pain, reports she has apt scheduled next Monday with MD to assess.  Continued session focus on functional strengthening and balalnce activities.  Added paloff activites for core activiation to assist with balalnce training wiht moderate difficulty.  Pt with increased difficutly descending 7in step height, cueing to improve mechanics, will continue to benefits from eccentric quad control.  Resumed supine and prone TKE to improve extension.  AROM at 2-114 degrees at EOS (was 2-110 last session.)  EOS iwth manual joint mobs and MFR to distal quad to assist with pain with activities and improve knee mobiltiy.      Rehab Potential  Good    PT Frequency  2x / week    PT Duration  4 weeks    PT Treatment/Interventions  ADLs/Self Care Home Management;Aquatic Therapy;Cryotherapy;Electrical Stimulation;Moist Heat;Ultrasound;DME Instruction;Gait training;Stair training;Functional mobility training;Therapeutic activities;Therapeutic  exercise;Balance training;Neuromuscular re-education;Patient/family education;Orthotic Fit/Training;Manual techniques;Manual lymph drainage;Scar mobilization;Dry needling;Passive range of motion;Energy conservation;Taping;Spinal Manipulations;Joint Manipulations    PT Next Visit Plan  Continue focus on R knee AROM, especially extension, manual to reduce adhesions at patellar tendon, and cotninue with strengthening as tolerated    PT Home Exercise Plan  eval: quad sets, heel slides, supine HS stretch; 10/2: bone foam for extension; 10/28: prone knee hang, SAQ, SLR, TKE, heel and toe raises       Patient will benefit from skilled therapeutic intervention in order to improve the following deficits and impairments:  Abnormal gait, Decreased activity tolerance, Decreased balance, Decreased endurance, Decreased mobility, Decreased range of motion, Decreased scar mobility, Decreased strength, Difficulty walking, Hypomobility, Increased edema, Increased fascial restricitons, Impaired  flexibility, Improper body mechanics, Pain  Visit Diagnosis: Acute pain of right knee  Muscle weakness (generalized)  Difficulty in walking, not elsewhere classified  Localized edema     Problem List Patient Active Problem List   Diagnosis Date Noted  . Status post total knee replacement, right 10/30/2017  . Obesity (BMI 30.0-34.9) 04/30/2017  . 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 Vaginal dryness, menopausal 08/21/2014  . Osteopenia 12/14/2013  . Essential hypertension 12/14/2013  . Complex regional pain syndrome of lower limb 10/04/2012  . Hyperlipidemia 10/04/2012  . Allergic rhinitis 10/04/2012  . Generalized osteoarthrosis, involving multiple sites 10/04/2012   Ihor Austin, [place]; [place]  Aldona Lento 12/22/2017, 1:58 PM  [place], [place], [address] Phone: [phone]   Fax:   [phone]  Name: [name] MRN: [id number] Date of Birth: 1945-05-02

## 2017-12-24 ENCOUNTER — Encounter (HOSPITAL_COMMUNITY): Payer: Self-pay

## 2017-12-24 ENCOUNTER — Ambulatory Visit (HOSPITAL_COMMUNITY): Payer: Medicare Other

## 2017-12-24 DIAGNOSIS — M6281 Muscle weakness (generalized): Secondary | ICD-10-CM

## 2017-12-24 DIAGNOSIS — R262 Difficulty in walking, not elsewhere classified: Secondary | ICD-10-CM

## 2017-12-24 DIAGNOSIS — R600 Localized edema: Secondary | ICD-10-CM

## 2017-12-24 DIAGNOSIS — M25561 Pain in right knee: Secondary | ICD-10-CM | POA: Diagnosis not present

## 2017-12-24 DIAGNOSIS — M25661 Stiffness of right knee, not elsewhere classified: Secondary | ICD-10-CM | POA: Diagnosis not present

## 2017-12-24 NOTE — Therapy (Signed)
[place], [place], [address] Phone: [phone]   Fax:  [phone]  Physical Therapy Treatment  Patient Details  Name: [name] MRN: [id number] Date of Birth: 12-05-1945 Referring Provider (PT): Netta Cedars, MD   Encounter Date: 12/24/2017  PT End of Session - 12/24/17 1129    Visit Number  22    Number of Visits  27    Date for PT Re-Evaluation  01/11/18   Minireassessment complete 11/23/17   Authorization Type  Medicare (Secondary: [employer])    Authorization Time Period  11/02/17 to 12/14/17; NEW: 12/14/17 to 01/11/18    Authorization - Visit Number  7    Authorization - Number of Visits  60    PT Start Time  1120   5' on bike for mobility, not included with charges   PT Stop Time  1203    PT Time Calculation (min)  43 min    Equipment Utilized During Treatment  Gait belt   with balance activities   Activity Tolerance  Patient tolerated treatment well    Behavior During Therapy  [facility] for tasks assessed/performed       Past Medical History:  Diagnosis Date  . Allergy   . Arthritis   . Contact dermatitis and other eczema, due to unspecified cause   . Depression   . Encounter for long-term (current) use of other medications   . Hyperlipidemia   . Other and unspecified hyperlipidemia   . Pain in joint, ankle and foot   . Screening for malignant neoplasm of the cervix     Past Surgical History:  Procedure Laterality Date  . ANKLE SURGERY  05/2006   Right Ankle  . Carpel TUnnel Release  [id number]   [id number]), Left(2006)  . CATARACT EXTRACTION Left 10/03/14  . CATARACT EXTRACTION Right 11/16/2014  . COLONOSCOPY    . DIAGNOSTIC LAPAROSCOPY  1980  . JOINT REPLACEMENT     Total Knee(L)-11/05/07  . KNEE ARTHROPLASTY  05/2007   Left  . RHINOPLASTY  1969  . TONSILLECTOMY     @ 72 y.o.  . TOTAL ANKLE ARTHROPLASTY  10/30/2011   Procedure: TOTAL ANKLE ARTHOPLASTY;  Surgeon: Wylene Simmer, MD;   Location: [facility];  Service: Orthopedics;  Laterality: Right;  RIGHT TOTAL ANKLE REPLACEMENT, POSSIBLE GASTROC RESESSION  . TOTAL KNEE ARTHROPLASTY Right 10/30/2017   Procedure: RIGHT TOTAL KNEE ARTHROPLASTY;  Surgeon: Netta Cedars, MD;  Location: [facility];  Service: Orthopedics;  Laterality: Right;  . TUBAL LIGATION  1980    There were no vitals filed for this visit.  Subjective Assessment - 12/24/17 1123    Subjective  Pt stated she is sore in knee today.  Continues to have more discomfort with Rt ankle pain, has apt with MD next Monday.      Patient Stated Goals  rehab the knee where she can bend and straighten it and walk wihtout assistance    Currently in Pain?  Yes    Pain Score  2     Pain Location  Knee    Pain Orientation  Right    Pain Descriptors / Indicators  Sore    Pain Type  Surgical pain    Pain Onset  1 to 4 weeks ago    Pain Frequency  Constant    Aggravating Factors   walking, WB, being on it too long    Pain Relieving Factors  rest, ice elevation, medicine    Effect of  Pain on Daily Activities  increases                       [facility] Adult PT Treatment/Exercise - 12/24/17 0001      Knee/Hip Exercises: Stretches   Passive Hamstring Stretch  Right;3 reps;30 seconds    Passive Hamstring Stretch Limitations  standing, 12" step    Knee: Self-Stretch to increase Flexion  Right;10 seconds    Knee: Self-Stretch Limitations  x10 reps, standing, 12" step      Knee/Hip Exercises: Aerobic   Stationary Bike  x4 mins, fwd full revolution for mobility, seat 5      Knee/Hip Exercises: Standing   Forward Lunges  Both;15 reps    Forward Lunges Limitations  bosu, dome up    Side Lunges  Both;10 reps    Side Lunges Limitations  BOSU dome up    Functional Squat  20 reps    Functional Squat Limitations  BUE support on // bars    Stairs  3Rt 1HR ascending and 2HR descending    SLS  RLE x5 reps max of 2"; Lt 23"    Gait Training  fwd tandem gait 23f x2RT; sidestep  2RT    Other Standing Knee Exercises  sidestep 2RT RTB    Other Standing Knee Exercises  paloff in tandem stance on foam wiht RTB 15x - reports increased ankle pain so DC for day      Knee/Hip Exercises: Supine   Terminal Knee Extension  AROM;Right;15 reps    Terminal Knee Extension Limitations  1/2 bolster under knee    Knee Extension  AROM    Knee Extension Limitations  2    Knee Flexion  AROM    Knee Flexion Limitations  116   was 114     Knee/Hip Exercises: Prone   Other Prone Exercises  TKE 10x 3" holds      Manual Therapy   Manual Therapy  Joint mobilization;Myofascial release    Manual therapy comments  completed separate rest of session    Joint Mobilization  patella mobs all direction and tib/fib grade II    Myofascial Release  to distal quad and proximal scar               PT Short Term Goals - 12/14/17 1122      PT SHORT TERM GOAL #1   Title  Pt will have improved R knee AROM from 5-90deg in order to decrease pain and maximize gait.    Baseline  11/4: 5-114deg    Time  3    Period  Weeks    Status  Achieved      PT SHORT TERM GOAL #2   Title  Pt will have decreased edema by 2cm or > in order to maximize R knee AROM.     Time  3    Period  Weeks    Status  Achieved      PT SHORT TERM GOAL #3   Title  Pt will have 1/2 grade improvement in MMT throughout in order to maximize balance and functional mobility.    Baseline  11/4: see MMT    Time  3    Period  Weeks    Status  Partially Met        PT Long Term Goals - 12/14/17 1122      PT LONG TERM GOAL #1   Title  Pt will have improved R knee AROM from 0-125deg in  order to further maximize gait and stair ambulation as well as floor transfers.    Baseline  11/4: 5-114deg    Time  6    Period  Weeks    Status  On-going      PT LONG TERM GOAL #2   Title  Pt will have 1 grade improvement in MMT throughout in order to further maximize gait, stair ambulation, and floor transfers.    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    Baseline  11/4: R: 5.7 sec, L: 38sec    Time  6    Period  Weeks    Status  Partially Met      PT LONG TERM GOAL #5   Title  Pt will have improved 5xSTS to 12 sec or < with proper form in order to demo improved functional strength and maximize her functional mobility.    Baseline  11/4: 9.9sec, decreased weigth shift off RLE    Time  6    Period  Weeks    Status  Partially Met            Plan - 12/24/17 1203    Clinical Impression Statement  Pt with MD apt on Monday to assess Rt ankle pain.  Some of the activities had to stop due to increase ankle pain this session.  Continued session focus iwht knee mobility, functional strengthening and balance activities.  Pt continues to demonstrate weak eccentric quad control with difficulty and control descending stairs.  Improved mechanics noted iwht prone TKE this session.  EOS iwht manual to address joint mobility for range and pain control, minimal swelling present on knee.  AROM continues to improve with AROM at 2-116 degrees (was 2-114 degrees last session).  No reports of increased pain at EOS.      Rehab Potential  Good    PT Frequency  2x / week    PT Duration  4 weeks    PT Treatment/Interventions  ADLs/Self Care Home Management;Aquatic Therapy;Cryotherapy;Electrical Stimulation;Moist Heat;Ultrasound;DME Instruction;Gait training;Stair training;Functional mobility  training;Therapeutic activities;Therapeutic exercise;Balance training;Neuromuscular re-education;Patient/family education;Orthotic Fit/Training;Manual techniques;Manual lymph drainage;Scar mobilization;Dry needling;Passive range of motion;Energy conservation;Taping;Spinal Manipulations;Joint Manipulations    PT Next Visit Plan  F/U on MD apt about ankle.  Hold SLS activities as much as possible for pain control.  Continue focus on R knee AROM, especially extension, manual to reduce adhesions at patellar tendon, and cotninue with strengthening as tolerated    PT Home Exercise Plan  eval: quad sets, heel slides, supine HS stretch; 10/2: bone foam for extension; 10/28: prone knee hang, SAQ, SLR, TKE, heel and toe raises       Patient will benefit from skilled therapeutic  intervention in order to improve the following deficits and impairments:  Abnormal gait, Decreased activity tolerance, Decreased balance, Decreased endurance, Decreased mobility, Decreased range of motion, Decreased scar mobility, Decreased strength, Difficulty walking, Hypomobility, Increased edema, Increased fascial restricitons, Impaired flexibility, Improper body mechanics, Pain  Visit Diagnosis: Acute pain of right knee  Muscle weakness (generalized)  Difficulty in walking, not elsewhere classified  Localized edema     Problem List Patient Active Problem List   Diagnosis Date Noted  . Status post total knee replacement, right 10/30/2017  . Obesity (BMI 30.0-34.9) 04/30/2017  . 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 Vaginal dryness, menopausal 08/21/2014  . Osteopenia 12/14/2013  . Essential hypertension 12/14/2013  . Complex regional pain syndrome of lower limb 10/04/2012  . Hyperlipidemia 10/04/2012  . Allergic rhinitis 10/04/2012  . Generalized osteoarthrosis, involving multiple sites 10/04/2012   Ihor Austin, [place]; [place]  Aldona Lento 12/24/2017, 12:09 PM  [place], [place], [address] Phone: [phone]   Fax:  [phone]  Name: JAIDAN STACHNIK MRN: [id number] Date of Birth: 1945/05/30

## 2017-12-28 DIAGNOSIS — M25571 Pain in right ankle and joints of right foot: Secondary | ICD-10-CM | POA: Diagnosis not present

## 2017-12-29 ENCOUNTER — Encounter (HOSPITAL_COMMUNITY): Payer: Self-pay

## 2017-12-29 ENCOUNTER — Ambulatory Visit (HOSPITAL_COMMUNITY): Payer: Medicare Other

## 2017-12-29 DIAGNOSIS — R600 Localized edema: Secondary | ICD-10-CM | POA: Diagnosis not present

## 2017-12-29 DIAGNOSIS — M6281 Muscle weakness (generalized): Secondary | ICD-10-CM

## 2017-12-29 DIAGNOSIS — M25661 Stiffness of right knee, not elsewhere classified: Secondary | ICD-10-CM | POA: Diagnosis not present

## 2017-12-29 DIAGNOSIS — M25561 Pain in right knee: Secondary | ICD-10-CM

## 2017-12-29 DIAGNOSIS — R262 Difficulty in walking, not elsewhere classified: Secondary | ICD-10-CM

## 2017-12-29 NOTE — Therapy (Signed)
[place], [place], [address] Phone: [phone]   Fax:  [phone]  Physical Therapy Treatment  Patient Details  Name: [name] MRN: [id number] Date of Birth: [birth year] Referring Provider (PT): Netta Cedars, MD   Encounter Date: 12/29/2017  PT End of Session - 12/29/17 0942    Visit Number  23    Number of Visits  27    Date for PT Re-Evaluation  01/11/18   Minireassessment complete 11/23/17   Authorization Type  Medicare (Secondary: [employer])    Authorization Time Period  11/02/17 to 12/14/17; NEW: 12/14/17 to 01/11/18    Authorization - Visit Number  76    Authorization - Number of Visits  60    PT Start Time  0945    PT Stop Time  1025    PT Time Calculation (min)  40 min    Equipment Utilized During Treatment  Gait belt   with balance activities   Activity Tolerance  Patient tolerated treatment well    Behavior During Therapy  WFL for tasks assessed/performed       Past Medical History:  Diagnosis Date  . Allergy   . Arthritis   . Contact dermatitis and other eczema, due to unspecified cause   . Depression   . Encounter for long-term (current) use of other medications   . Hyperlipidemia   . Other and unspecified hyperlipidemia   . Pain in joint, ankle and foot   . Screening for malignant neoplasm of the cervix     Past Surgical History:  Procedure Laterality Date  . ANKLE SURGERY  05/2006   Right Ankle  . Carpel TUnnel Release  [id number]   [id number]), Left(2006)  . CATARACT EXTRACTION Left 10/03/14  . CATARACT EXTRACTION Right 11/16/2014  . COLONOSCOPY    . DIAGNOSTIC LAPAROSCOPY  1980  . JOINT REPLACEMENT     Total Knee(L)-11/05/07  . KNEE ARTHROPLASTY  05/2007   Left  . RHINOPLASTY  1969  . TONSILLECTOMY     @ 72 y.o.  . TOTAL ANKLE ARTHROPLASTY  10/30/2011   Procedure: TOTAL ANKLE ARTHOPLASTY;  Surgeon: Wylene Simmer, MD;  Location: [facility];  Service: Orthopedics;  Laterality: Right;   RIGHT TOTAL ANKLE REPLACEMENT, POSSIBLE GASTROC RESESSION  . TOTAL KNEE ARTHROPLASTY Right 10/30/2017   Procedure: RIGHT TOTAL KNEE ARTHROPLASTY;  Surgeon: Netta Cedars, MD;  Location: [facility];  Service: Orthopedics;  Laterality: Right;  . TUBAL LIGATION  1980    There were no vitals filed for this visit.  Subjective Assessment - 12/29/17 0945    Subjective  Pt states she saw her nakle MD yesterday. There is no damage to the replacement hardware, but he told her that the joint below the ankle replacement is very arthritic and her longitudinal arch has collapsed more. He thinks this is what is causing her R ankle pain. She states he wants her to get new orthotics to support her foot and will see her again in 3 months.     Patient Stated Goals  rehab the knee where she can bend and straighten it and walk wihtout assistance    Currently in Pain?  No/denies    Pain Onset  1 to 4 weeks ago             [facility] Adult PT Treatment/Exercise - 12/29/17 0001      Exercises   Exercises  Knee/Hip      Knee/Hip Exercises: Stretches   Passive Hamstring  Stretch  Right;3 reps;30 seconds    Passive Hamstring Stretch Limitations  standing, 12" step    Knee: Self-Stretch to increase Flexion  Right;10 seconds    Knee: Self-Stretch Limitations  x10 reps, standing, 12" step    Gastroc Stretch  Both;3 reps;30 seconds    Gastroc Stretch Limitations  slant board      Knee/Hip Exercises: Machines for Strengthening   Cybex Knee Extension  BLE 10#, 2x10 reps    Cybex Knee Flexion  BLE 4plates, 2x10 reps    Other Machine  multigym leg press 40#, 2x10 reps      Knee/Hip Exercises: Standing   Forward Lunges  Both;15 reps    Forward Lunges Limitations  bosu, dome up    Side Lunges  Both;15 reps    Side Lunges Limitations  BOSU dome up    SLS with Vectors  x10RT 3" holds, 1UE    Gait Training  fwd tandem gait 17f x2RT      Knee/Hip Exercises: Supine   Knee Extension Limitations  2    Knee Flexion  Limitations  112      Manual Therapy   Manual Therapy  Joint mobilization    Manual therapy comments  completed separate rest of session    Joint Mobilization  patellar mobs all direction and tib/fib grade II             PT Education - 12/29/17 0943    Education Details  exercise technique, updated HEP    Person(s) Educated  Patient    Methods  Explanation;Demonstration;Handout    Comprehension  Verbalized understanding;Returned demonstration       PT Short Term Goals - 12/14/17 1122      PT SHORT TERM GOAL #1   Title  Pt will have improved R knee AROM from 5-90deg in order to decrease pain and maximize gait.    Baseline  11/4: 5-114deg    Time  3    Period  Weeks    Status  Achieved      PT SHORT TERM GOAL #2   Title  Pt will have decreased edema by 2cm or > in order to maximize R knee AROM.     Time  3    Period  Weeks    Status  Achieved      PT SHORT TERM GOAL #3   Title  Pt will have 1/2 grade improvement in MMT throughout in order to maximize balance and functional mobility.    Baseline  11/4: see MMT    Time  3    Period  Weeks    Status  Partially Met        PT Long Term Goals - 12/14/17 1122      PT LONG TERM GOAL #1   Title  Pt will have improved R knee AROM from 0-125deg in order to further maximize gait and stair ambulation as well as floor transfers.    Baseline  11/4: 5-114deg    Time  6    Period  Weeks    Status  On-going      PT LONG TERM GOAL #2   Title  Pt will have 1 grade improvement in MMT throughout in order to further maximize gait, stair ambulation, and floor transfers.    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    Baseline  11/4: R: 5.7 sec, L: 38sec    Time  6    Period  Weeks    Status  Partially Met      PT LONG TERM GOAL #5   Title  Pt will have improved 5xSTS to 12 sec or < with proper form in order to demo improved functional strength and maximize her functional mobility.    Baseline  11/4: 9.9sec, decreased weigth shift off RLE    Time  6    Period  Weeks    Status  Partially Met            Plan - 12/29/17 1026    Clinical Impression Statement  Pt presents to therapy after having seen her ankle surgeon. He thinks her pain is being caused by the ankle/foot joint underneath her replacement hardware and increased pronation from longitudinal arch collapse. Today's session focused on strengthening and balance within tolerance. Added machine strengthening this date with good tolerance, just reports of mm fatigue. Balance limited due to ankle but she was able to complete all exercises this date. Ended with joint mobs for knee ROM. AROM 2 to 112deg this date; slight decrease in AROM likely due to subjective reports of increased soreness at EOS. Continue as planned, progressing as able.     Rehab Potential  Good    PT Frequency  2x / week    PT Duration  4 weeks    PT Treatment/Interventions  ADLs/Self Care Home Management;Aquatic Therapy;Cryotherapy;Electrical Stimulation;Moist Heat;Ultrasound;DME Instruction;Gait training;Stair training;Functional mobility training;Therapeutic activities;Therapeutic exercise;Balance training;Neuromuscular re-education;Patient/family education;Orthotic Fit/Training;Manual techniques;Manual lymph drainage;Scar mobilization;Dry needling;Passive range of motion;Energy conservation;Taping;Spinal Manipulations;Joint Manipulations    PT  Next Visit Plan   Hold SLS activities as much as possible for pain control. Continue focus on R knee AROM, especially extension, manual to reduce adhesions at patellar tendon, and cotninue with strengthening as tolerated    PT Home Exercise Plan  eval: quad sets, heel slides, supine HS stretch; 10/2: bone foam for extension; 10/28: prone knee hang, SAQ, SLR, TKE, heel and toe raises; 11/19: mini squat, hip abd, sidestepping, wall squat    Consulted and Agree with Plan of Care  Patient       Patient will benefit from skilled therapeutic intervention in order to improve the following deficits and impairments:  Abnormal gait, Decreased activity tolerance, Decreased balance, Decreased endurance, Decreased mobility, Decreased range of motion, Decreased scar mobility, Decreased strength, Difficulty walking, Hypomobility, Increased edema, Increased fascial restricitons, Impaired flexibility, Improper body mechanics, Pain  Visit Diagnosis: Acute pain of right knee  Muscle weakness (generalized)  Difficulty in walking, not elsewhere classified  Localized edema     Problem List Patient Active Problem List   Diagnosis Date Noted  . Status post total knee replacement, right 10/30/2017  . Obesity (BMI 30.0-34.9) 04/30/2017  . Map-dot-fingerprint  corneal dystrophy 08/21/2014  . Vaginal dryness, menopausal 08/21/2014  . Osteopenia 12/14/2013  . Essential hypertension 12/14/2013  . Complex regional pain syndrome of lower limb 10/04/2012  . Hyperlipidemia 10/04/2012  . Allergic rhinitis 10/04/2012  . Generalized osteoarthrosis, involving multiple sites 10/04/2012        Geraldine Solar PT, DPT  [facility] [address] [place], [place], [address] Phone: [phone]   Fax:  [phone]  Name: ARIEN MORINE MRN: [id number] Date of Birth: 1945-03-02

## 2018-01-05 ENCOUNTER — Ambulatory Visit (HOSPITAL_COMMUNITY): Payer: Medicare Other

## 2018-01-05 ENCOUNTER — Encounter (HOSPITAL_COMMUNITY): Payer: Self-pay

## 2018-01-05 DIAGNOSIS — R262 Difficulty in walking, not elsewhere classified: Secondary | ICD-10-CM | POA: Diagnosis not present

## 2018-01-05 DIAGNOSIS — M6281 Muscle weakness (generalized): Secondary | ICD-10-CM

## 2018-01-05 DIAGNOSIS — M25561 Pain in right knee: Secondary | ICD-10-CM | POA: Diagnosis not present

## 2018-01-05 DIAGNOSIS — R600 Localized edema: Secondary | ICD-10-CM | POA: Diagnosis not present

## 2018-01-05 DIAGNOSIS — M25661 Stiffness of right knee, not elsewhere classified: Secondary | ICD-10-CM | POA: Diagnosis not present

## 2018-01-05 NOTE — Therapy (Signed)
[place], [place], [address] Phone: [phone]   Fax:  [phone]  Physical Therapy Treatment  Patient Details  Name: [name] MRN: [id number] Date of Birth: [birth year] Referring Provider (PT): Netta Cedars, MD   Encounter Date: 01/05/2018  PT End of Session - 01/05/18 1038    Visit Number  24    Number of Visits  27    Date for PT Re-Evaluation  01/11/18   Minireassess complete  11/23/17   Authorization Type  Medicare (Secondary: [employer])    Authorization Time Period  11/02/17 to 12/14/17; NEW: 12/14/17 to 01/11/18    Authorization - Visit Number  20    Authorization - Number of Visits  60    PT Start Time  1034   4' on bike, not included with charges   PT Stop Time  1116    PT Time Calculation (min)  42 min    Equipment Utilized During Treatment  Gait belt   with balance activities   Activity Tolerance  Patient tolerated treatment well    Behavior During Therapy  WFL for tasks assessed/performed       Past Medical History:  Diagnosis Date  . Allergy   . Arthritis   . Contact dermatitis and other eczema, due to unspecified cause   . Depression   . Encounter for long-term (current) use of other medications   . Hyperlipidemia   . Other and unspecified hyperlipidemia   . Pain in joint, ankle and foot   . Screening for malignant neoplasm of the cervix     Past Surgical History:  Procedure Laterality Date  . ANKLE SURGERY  05/2006   Right Ankle  . Carpel TUnnel Release  [id number]   [id number]), Left(2006)  . CATARACT EXTRACTION Left 10/03/14  . CATARACT EXTRACTION Right 11/16/2014  . COLONOSCOPY    . DIAGNOSTIC LAPAROSCOPY  1980  . JOINT REPLACEMENT     Total Knee(L)-11/05/07  . KNEE ARTHROPLASTY  05/2007   Left  . RHINOPLASTY  1969  . TONSILLECTOMY     @ 72 y.o.  . TOTAL ANKLE ARTHROPLASTY  10/30/2011   Procedure: TOTAL ANKLE ARTHOPLASTY;  Surgeon: Wylene Simmer, MD;  Location: [facility];   Service: Orthopedics;  Laterality: Right;  RIGHT TOTAL ANKLE REPLACEMENT, POSSIBLE GASTROC RESESSION  . TOTAL KNEE ARTHROPLASTY Right 10/30/2017   Procedure: RIGHT TOTAL KNEE ARTHROPLASTY;  Surgeon: Netta Cedars, MD;  Location: [facility];  Service: Orthopedics;  Laterality: Right;  . TUBAL LIGATION  1980    There were no vitals filed for this visit.  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  Pt stated she took her dogs hunting, walked for 2 hours and has some tightness today.      Pertinent History  TKA on 10/30/17    Patient Stated Goals  rehab the knee where she can bend and straighten it and walk wihtout assistance    Currently in Pain?  No/denies   tenderness at end range with flexion on bike                      [facility] Adult PT Treatment/Exercise - 01/05/18 0001      Knee/Hip Exercises: Stretches   Passive Hamstring Stretch  Right;3 reps;30 seconds    Passive Hamstring Stretch Limitations  standing, 12" step  Knee: Self-Stretch to increase Flexion  Right;10 seconds    Knee: Self-Stretch Limitations  x10 reps, standing, 12" step      Knee/Hip Exercises: Aerobic   Stationary Bike  x4 mins, fwd full revolution for mobility, seat 8      Knee/Hip Exercises: Machines for Strengthening   Cybex Knee Extension  BLE 10#, 2x10 reps    Cybex Knee Flexion  BLE 4plates, 2x10 reps    Other Machine  multigym leg press 40#, 2x10 reps      Knee/Hip Exercises: Standing   Forward Lunges  Both;15 reps    Forward Lunges Limitations  on floor, BOSU not available    Side Lunges  Both;15 reps    Side Lunges Limitations  on floor, BOSU not available    SLS with Vectors  BLE 5x5" on foam, 1 UE    Gait Training  fwd tandem gait 64f x2RT      Knee/Hip Exercises: Supine   Knee Extension  AROM    Knee Extension Limitations  2    Knee Flexion  AROM    Knee Flexion Limitations   110   was 112 degrees last sessoin     Manual Therapy   Manual Therapy  Joint mobilization    Manual therapy comments  completed separate rest of session    Joint Mobilization  patellar mobs all direction and tib/fib grade II               PT Short Term Goals - 12/14/17 1122      PT SHORT TERM GOAL #1   Title  Pt will have improved R knee AROM from 5-90deg in order to decrease pain and maximize gait.    Baseline  11/4: 5-114deg    Time  3    Period  Weeks    Status  Achieved      PT SHORT TERM GOAL #2   Title  Pt will have decreased edema by 2cm or > in order to maximize R knee AROM.     Time  3    Period  Weeks    Status  Achieved      PT SHORT TERM GOAL #3   Title  Pt will have 1/2 grade improvement in MMT throughout in order to maximize balance and functional mobility.    Baseline  11/4: see MMT    Time  3    Period  Weeks    Status  Partially Met        PT Long Term Goals - 12/14/17 1122      PT LONG TERM GOAL #1   Title  Pt will have improved R knee AROM from 0-125deg in order to further maximize gait and stair ambulation as well as floor transfers.    Baseline  11/4: 5-114deg    Time  6    Period  Weeks    Status  On-going      PT LONG TERM GOAL #2   Title  Pt will have 1 grade improvement in MMT throughout in order to further maximize gait, stair ambulation, and floor transfers.    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    Baseline  11/4: R: 5.7 sec, L: 38sec    Time  6    Period  Weeks    Status  Partially Met      PT LONG TERM GOAL #5   Title  Pt will have improved 5xSTS to 12 sec or < with proper form in order to demo improved functional strength and maximize her functional mobility.    Baseline  11/4: 9.9sec, decreased weigth shift off RLE    Time  6    Period  Weeks    Status  Partially Met            Plan - 01/05/18 1122    Clinical Impression Statement  Pt arrived with reports of increased activiites including taking her dogs hunting and walking for 2 hours, has began working at [employer] other increased activities at home.  Due to increased activities pt presents with increased stiffness Rt knee.  Resumed bicycle and mobility stretches to assist with stiffness as well as functional strengthening and balance activities.  Pt with good tolerance with vector stance and no reports of increased knee or ankle pain, pt wearing orthotics with reports of vast improvements.  AROM 2-110 degrees.  Able to demonstrate good mechanics with exercises following initial cueing for form/technqiues.  Manual joint mobs complete to assist iwht mobility.      Rehab Potential  Good    PT Frequency  2x / week    PT Duration  4 weeks    PT Treatment/Interventions  ADLs/Self Care Home Management;Aquatic Therapy;Cryotherapy;Electrical Stimulation;Moist Heat;Ultrasound;DME Instruction;Gait training;Stair training;Functional mobility training;Therapeutic activities;Therapeutic exercise;Balance training;Neuromuscular re-education;Patient/family education;Orthotic Fit/Training;Manual techniques;Manual lymph drainage;Scar mobilization;Dry needling;Passive range of motion;Energy conservation;Taping;Spinal Manipulations;Joint Manipulations    PT Next Visit Plan   Hold SLS activities as much as possible for pain control. Continue focus on R knee AROM, especially extension, manual to reduce adhesions at patellar  tendon, and cotninue with strengthening as tolerated    PT Home Exercise Plan  eval: quad sets, heel slides, supine HS stretch; 10/2: bone foam for extension; 10/28: prone knee hang, SAQ, SLR, TKE, heel and toe raises; 11/19: mini squat, hip abd, sidestepping, wall squat       Patient will benefit from skilled therapeutic intervention in order to improve the following deficits and impairments:  Abnormal gait, Decreased activity tolerance, Decreased balance, Decreased endurance, Decreased mobility, Decreased range of motion, Decreased scar mobility, Decreased strength, Difficulty walking, Hypomobility, Increased edema, Increased fascial restricitons, Impaired flexibility, Improper body mechanics, Pain  Visit Diagnosis: Acute pain of right knee  Muscle weakness (generalized)  Difficulty in walking, not elsewhere classified  Localized edema     Problem List Patient Active Problem List   Diagnosis Date Noted  . Status post total knee replacement, right 10/30/2017  . Obesity (BMI 30.0-34.9) 04/30/2017  . 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 Vaginal dryness, menopausal 08/21/2014  . Osteopenia 12/14/2013  . Essential hypertension 12/14/2013  . Complex regional pain syndrome of lower limb 10/04/2012  . Hyperlipidemia  10/04/2012  . Allergic rhinitis 10/04/2012  . Generalized osteoarthrosis, involving multiple sites 10/04/2012   Ihor Austin, [place]; [place]  Aldona Lento 01/05/2018, 12:48 PM  [place], [place], [address] Phone: [phone]   Fax:  [phone]  Name: [name] MRN: [id number] Date of Birth: 01-31-1946

## 2018-01-06 ENCOUNTER — Other Ambulatory Visit: Payer: Self-pay | Admitting: Nurse Practitioner

## 2018-01-06 DIAGNOSIS — F329 Major depressive disorder, single episode, unspecified: Secondary | ICD-10-CM

## 2018-01-06 DIAGNOSIS — F32A Depression, unspecified: Secondary | ICD-10-CM

## 2018-01-12 ENCOUNTER — Encounter (HOSPITAL_COMMUNITY): Payer: Self-pay | Admitting: Physical Therapy

## 2018-01-12 ENCOUNTER — Ambulatory Visit (HOSPITAL_COMMUNITY): Payer: Medicare Other | Attending: Orthopedic Surgery | Admitting: Physical Therapy

## 2018-01-12 DIAGNOSIS — R600 Localized edema: Secondary | ICD-10-CM

## 2018-01-12 DIAGNOSIS — R262 Difficulty in walking, not elsewhere classified: Secondary | ICD-10-CM

## 2018-01-12 DIAGNOSIS — M25661 Stiffness of right knee, not elsewhere classified: Secondary | ICD-10-CM | POA: Diagnosis not present

## 2018-01-12 DIAGNOSIS — M25561 Pain in right knee: Secondary | ICD-10-CM | POA: Diagnosis not present

## 2018-01-12 DIAGNOSIS — M6281 Muscle weakness (generalized): Secondary | ICD-10-CM | POA: Diagnosis not present

## 2018-01-12 NOTE — Therapy (Signed)
[place], [place], [address] Phone: [phone]   Fax:  [phone]  Physical Therapy Treatment  Patient Details  Name: [name] MRN: [id number] Date of Birth: 06/24/1945 Referring Provider (PT): Netta Cedars, MD   Encounter Date: 01/12/2018  PT End of Session - 01/12/18 1037    Visit Number  25    Number of Visits  25    Date for PT Re-Evaluation  01/11/18   Minireassess complete  11/23/17   Authorization Type  Medicare (Secondary: [employer])    Authorization Time Period  11/02/17 to 12/14/17; NEW: 12/14/17 to 01/11/18    Authorization - Visit Number  41    Authorization - Number of Visits  60    PT Start Time  1038    PT Stop Time  1109    PT Time Calculation (min)  31 min    Equipment Utilized During Treatment  Gait belt   with balance activities   Activity Tolerance  Patient tolerated treatment well    Behavior During Therapy  WFL for tasks assessed/performed       Past Medical History:  Diagnosis Date  . Allergy   . Arthritis   . Contact dermatitis and other eczema, due to unspecified cause   . Depression   . Encounter for long-term (current) use of other medications   . Hyperlipidemia   . Other and unspecified hyperlipidemia   . Pain in joint, ankle and foot   . Screening for malignant neoplasm of the cervix     Past Surgical History:  Procedure Laterality Date  . ANKLE SURGERY  05/2006   Right Ankle  . Carpel TUnnel Release  [id number]   [id number]), Left(2006)  . CATARACT EXTRACTION Left 10/03/14  . CATARACT EXTRACTION Right 11/16/2014  . COLONOSCOPY    . DIAGNOSTIC LAPAROSCOPY  1980  . JOINT REPLACEMENT     Total Knee(L)-11/05/07  . KNEE ARTHROPLASTY  05/2007   Left  . RHINOPLASTY  1969  . TONSILLECTOMY     @ 72 y.o.  . TOTAL ANKLE ARTHROPLASTY  10/30/2011   Procedure: TOTAL ANKLE ARTHOPLASTY;  Surgeon: Wylene Simmer, MD;  Location: [place];  Service: Orthopedics;  Laterality: Right;   RIGHT TOTAL ANKLE REPLACEMENT, POSSIBLE GASTROC RESESSION  . TOTAL KNEE ARTHROPLASTY Right 10/30/2017   Procedure: RIGHT TOTAL KNEE ARTHROPLASTY;  Surgeon: Netta Cedars, MD;  Location: [place];  Service: Orthopedics;  Laterality: Right;  . TUBAL LIGATION  1980    There were no vitals filed for this visit.  Subjective Assessment - 01/12/18 1040    Subjective  PT states that her knee is still sore and very tight. She uses her bike at home and does deep water aerobics three times a day.  Last week she returned to all of her normal activities but the next day she was very sore but stable.      Pertinent History  TKA on 10/30/17    How long can you sit comfortably?  over an hour     How long can you stand comfortably?  two hours     How long can you walk comfortably?  two hours     Patient Stated Goals  rehab the knee where she can bend and straighten it and walk wihtout assistance    Pain Score  3     Pain Location  Knee    Pain Orientation  Right    Pain Descriptors / Indicators  Tightness  Pain Type  Chronic pain    Pain Onset  More than a month ago    Pain Frequency  Intermittent    Aggravating Factors   mornings     Pain Relieving Factors  ice     Effect of Pain on Daily Activities  now doing all normal activity          [facility] PT Assessment - 01/12/18 0001      Assessment   Medical Diagnosis  R TKA    Referring Provider (PT)  Netta Cedars, MD    Onset Date/Surgical Date  10/30/17    Hand Dominance  Left    Next MD Visit  01/20/18    Prior Therapy  OPPT for R arthroscopic surgery prior to TKA      Observation/Other Assessments   Focus on Therapeutic Outcomes (FOTO)   45 % limitation was 62      Circumferential Edema   Circumferential - Right  35cm, joint line      AROM   Right Knee Extension  4   was 6   Right Knee Flexion  116   was 108     Strength   Right Hip Flexion  5/5   was 4+   Right Hip Extension  4+/5   was 3+   Right Hip ABduction  5/5   was 4-    Left Hip Extension  4+/5   was 4   Left Hip ABduction  5/5   was 4   Right Knee Flexion  5/5   was 4+   Right Knee Extension  5/5   was 4+   Right Ankle Dorsiflexion  5/5   wa 4+     Ambulation/Gait   Ambulation Distance (Feet)  552 Feet   3MWT; was 394 with SPC   Assistive device  None    Gait Pattern  Step-through pattern;Decreased stance time - right;Decreased step length - left;Decreased dorsiflexion - right;Antalgic;Lateral trunk lean to left   R ankle ER, limp on R     Balance   Balance Assessed  Yes      Static Standing Balance   Static Standing - Balance Support  No upper extremity supported    Static Standing Balance -  Activities   Single Leg Stance - Right Leg;Single Leg Stance - Left Leg      Standardized Balance Assessment   Standardized Balance Assessment  Five Times Sit to Stand    Five times sit to stand comments   8.39sec, weight shifted off RLE   was 13.2 sec, weight shifted off RLE, no UE                  [facility] Adult PT Treatment/Exercise - 01/12/18 0001      Exercises   Exercises  Knee/Hip      Knee/Hip Exercises: Stretches   Active Hamstring Stretch  Right;3 reps;30 seconds      Knee/Hip Exercises: Standing   SLS with Vectors  x 5 B       Knee/Hip Exercises: Seated   Sit to Sand  2 sets;10 reps      Knee/Hip Exercises: Supine   Quad Sets  10 reps    Heel Slides  Right;5 reps               PT Short Term Goals - 01/12/18 1054      PT SHORT TERM GOAL #1   Title  Pt will have improved R knee AROM from 5-90deg  in order to decrease pain and maximize gait.    Baseline  11/4: 5-114deg    Time  3    Period  Weeks    Status  Achieved      PT SHORT TERM GOAL #2   Title  Pt will have decreased edema by 2cm or > in order to maximize R knee AROM.     Time  3    Period  Weeks    Status  Achieved      PT SHORT TERM GOAL #3   Title  Pt will have 1/2 grade improvement in MMT throughout in order to maximize balance and  functional mobility.    Baseline  11/4: see MMT    Time  3    Period  Weeks    Status  Achieved        PT Long Term Goals - 01/12/18 1054      PT LONG TERM GOAL #1   Title  Pt will have improved R knee AROM from 0-125deg in order to further maximize gait and stair ambulation as well as floor transfers.    Baseline  11/4: 5-114deg;  01/12/2018:      Time  6    Period  Weeks    Status  On-going      PT LONG TERM GOAL #2   Title  Pt will have 1 grade improvement in MMT throughout in order to further maximize gait, stair ambulation, and floor transfers.    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    Baseline  11/4: R: 5.7 sec, L: 38sec;  01/12/2018 10 seconds     Time  6    Period  Weeks    Status  Not Met      PT LONG TERM GOAL #5   Title  Pt will have improved 5xSTS to 12 sec or < with proper form in order to demo improved functional strength and maximize her functional mobility.    Baseline  12/3 9.39 was 11/4: 9.9sec, decreased weigth shift off RLE    Time  6    Period  Weeks    Status  Achieved            Plan - 01/12/18 1110    Clinical Impression Statement  PT reassessed.  She is still lacking in ROM and balance but is completing all her normal activities at this time.  Pt states that her balance has always been off since she had a RT Total ankle replacement.  PT and therapist agree to discharge to [facility]P     Rehab Potential  Good    PT  Frequency  2x / week    PT  Duration  4 weeks    PT Treatment/Interventions  ADLs/Self Care Home Management;Aquatic Therapy;Cryotherapy;Electrical Stimulation;Moist Heat;Ultrasound;DME Instruction;Gait training;Stair training;Functional mobility training;Therapeutic activities;Therapeutic exercise;Balance training;Neuromuscular re-education;Patient/family education;Orthotic Fit/Training;Manual techniques;Manual lymph drainage;Scar mobilization;Dry needling;Passive range of motion;Energy conservation;Taping;Spinal Manipulations;Joint Manipulations    PT Next Visit Plan  DIscharge to HEP     PT Home Exercise Plan  eval: quad sets, heel slides, supine HS stretch; 10/2: bone foam for extension; 10/28: prone knee hang, SAQ, SLR, TKE, heel and toe raises; 11/19: mini squat, hip abd, sidestepping, wall squat       Patient will benefit from skilled therapeutic intervention in order to improve the following deficits and impairments:  Abnormal gait, Decreased activity tolerance, Decreased balance, Decreased endurance, Decreased mobility, Decreased range of motion, Decreased scar mobility, Decreased strength, Difficulty walking, Hypomobility, Increased edema, Increased fascial restricitons, Impaired flexibility, Improper body mechanics, Pain  Visit Diagnosis: Acute pain of right knee - Plan: PT plan of care cert/re-cert  Muscle weakness (generalized) - Plan: PT plan of care cert/re-cert  Difficulty in walking, not elsewhere classified - Plan: PT plan of care cert/re-cert  Localized edema - Plan: PT plan of care cert/re-cert  Stiffness of right knee, not elsewhere classified - Plan: PT plan of care cert/re-cert     Problem List Patient Active Problem List   Diagnosis Date Noted  . Status post total knee replacement, right 10/30/2017  . Obesity (BMI 30.0-34.9) 04/30/2017  . 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 Vaginal dryness, menopausal 08/21/2014  . Osteopenia 12/14/2013  .  Essential hypertension 12/14/2013  . Complex regional pain syndrome of lower limb 10/04/2012  . Hyperlipidemia 10/04/2012  . Allergic rhinitis 10/04/2012  . Generalized osteoarthrosis, involving multiple sites 10/04/2012    Rayetta Humphrey, PT CLT [phone] 01/12/2018, 11:13 AM  [facility] [address] [place], [place], [address] Phone: [phone]   Fax:  [phone]  Name: [name] MRN: [id number] Date of Birth: 01-19-1946

## 2018-01-14 ENCOUNTER — Encounter (HOSPITAL_COMMUNITY): Payer: Medicare Other

## 2018-01-21 DIAGNOSIS — L570 Actinic keratosis: Secondary | ICD-10-CM | POA: Diagnosis not present

## 2018-01-21 DIAGNOSIS — D2261 Melanocytic nevi of right upper limb, including shoulder: Secondary | ICD-10-CM | POA: Diagnosis not present

## 2018-01-21 DIAGNOSIS — D485 Neoplasm of uncertain behavior of skin: Secondary | ICD-10-CM | POA: Diagnosis not present

## 2018-01-21 DIAGNOSIS — D1801 Hemangioma of skin and subcutaneous tissue: Secondary | ICD-10-CM | POA: Diagnosis not present

## 2018-01-21 DIAGNOSIS — D0471 Carcinoma in situ of skin of right lower limb, including hip: Secondary | ICD-10-CM | POA: Diagnosis not present

## 2018-01-21 DIAGNOSIS — L814 Other melanin hyperpigmentation: Secondary | ICD-10-CM | POA: Diagnosis not present

## 2018-01-21 DIAGNOSIS — L821 Other seborrheic keratosis: Secondary | ICD-10-CM | POA: Diagnosis not present

## 2018-01-21 DIAGNOSIS — D045 Carcinoma in situ of skin of trunk: Secondary | ICD-10-CM | POA: Diagnosis not present

## 2018-01-21 DIAGNOSIS — L820 Inflamed seborrheic keratosis: Secondary | ICD-10-CM | POA: Diagnosis not present

## 2018-01-21 DIAGNOSIS — Z85828 Personal history of other malignant neoplasm of skin: Secondary | ICD-10-CM | POA: Diagnosis not present

## 2018-01-21 DIAGNOSIS — D225 Melanocytic nevi of trunk: Secondary | ICD-10-CM | POA: Diagnosis not present

## 2018-01-21 DIAGNOSIS — D2262 Melanocytic nevi of left upper limb, including shoulder: Secondary | ICD-10-CM | POA: Diagnosis not present

## 2018-01-25 DIAGNOSIS — M24661 Ankylosis, right knee: Secondary | ICD-10-CM | POA: Diagnosis not present

## 2018-01-25 DIAGNOSIS — G8918 Other acute postprocedural pain: Secondary | ICD-10-CM | POA: Diagnosis not present

## 2018-02-17 DIAGNOSIS — D0471 Carcinoma in situ of skin of right lower limb, including hip: Secondary | ICD-10-CM | POA: Diagnosis not present

## 2018-02-17 DIAGNOSIS — Z85828 Personal history of other malignant neoplasm of skin: Secondary | ICD-10-CM | POA: Diagnosis not present

## 2018-02-17 DIAGNOSIS — D045 Carcinoma in situ of skin of trunk: Secondary | ICD-10-CM | POA: Diagnosis not present

## 2018-02-17 DIAGNOSIS — L603 Nail dystrophy: Secondary | ICD-10-CM | POA: Diagnosis not present

## 2018-02-25 ENCOUNTER — Other Ambulatory Visit: Payer: Self-pay | Admitting: Internal Medicine

## 2018-02-25 ENCOUNTER — Other Ambulatory Visit: Payer: Self-pay | Admitting: Pulmonary Disease

## 2018-02-25 DIAGNOSIS — Z1231 Encounter for screening mammogram for malignant neoplasm of breast: Secondary | ICD-10-CM

## 2018-03-23 DIAGNOSIS — J3081 Allergic rhinitis due to animal (cat) (dog) hair and dander: Secondary | ICD-10-CM | POA: Diagnosis not present

## 2018-03-23 DIAGNOSIS — J301 Allergic rhinitis due to pollen: Secondary | ICD-10-CM | POA: Diagnosis not present

## 2018-03-23 DIAGNOSIS — J3089 Other allergic rhinitis: Secondary | ICD-10-CM | POA: Diagnosis not present

## 2018-03-30 ENCOUNTER — Ambulatory Visit: Payer: Medicare Other

## 2018-04-27 ENCOUNTER — Ambulatory Visit: Payer: Medicare Other

## 2018-05-13 DIAGNOSIS — F329 Major depressive disorder, single episode, unspecified: Secondary | ICD-10-CM | POA: Diagnosis not present

## 2018-05-13 DIAGNOSIS — G4701 Insomnia due to medical condition: Secondary | ICD-10-CM | POA: Diagnosis not present

## 2018-05-13 DIAGNOSIS — Z23 Encounter for immunization: Secondary | ICD-10-CM | POA: Diagnosis not present

## 2018-05-13 DIAGNOSIS — I10 Essential (primary) hypertension: Secondary | ICD-10-CM | POA: Diagnosis not present

## 2018-05-13 DIAGNOSIS — Z131 Encounter for screening for diabetes mellitus: Secondary | ICD-10-CM | POA: Diagnosis not present

## 2018-05-13 DIAGNOSIS — E785 Hyperlipidemia, unspecified: Secondary | ICD-10-CM | POA: Diagnosis not present

## 2018-07-28 DIAGNOSIS — Z961 Presence of intraocular lens: Secondary | ICD-10-CM | POA: Diagnosis not present

## 2018-07-28 DIAGNOSIS — H26491 Other secondary cataract, right eye: Secondary | ICD-10-CM | POA: Diagnosis not present

## 2018-08-19 DIAGNOSIS — E785 Hyperlipidemia, unspecified: Secondary | ICD-10-CM | POA: Diagnosis not present

## 2018-08-19 DIAGNOSIS — I10 Essential (primary) hypertension: Secondary | ICD-10-CM | POA: Diagnosis not present

## 2018-09-14 DIAGNOSIS — Z13820 Encounter for screening for osteoporosis: Secondary | ICD-10-CM | POA: Diagnosis not present

## 2018-09-14 DIAGNOSIS — R5383 Other fatigue: Secondary | ICD-10-CM | POA: Diagnosis not present

## 2018-09-14 DIAGNOSIS — D485 Neoplasm of uncertain behavior of skin: Secondary | ICD-10-CM | POA: Diagnosis not present

## 2018-09-14 DIAGNOSIS — F329 Major depressive disorder, single episode, unspecified: Secondary | ICD-10-CM | POA: Diagnosis not present

## 2018-09-14 DIAGNOSIS — L918 Other hypertrophic disorders of the skin: Secondary | ICD-10-CM | POA: Diagnosis not present

## 2018-09-14 DIAGNOSIS — Z85828 Personal history of other malignant neoplasm of skin: Secondary | ICD-10-CM | POA: Diagnosis not present

## 2018-09-14 DIAGNOSIS — I10 Essential (primary) hypertension: Secondary | ICD-10-CM | POA: Diagnosis not present

## 2018-09-14 DIAGNOSIS — Z1231 Encounter for screening mammogram for malignant neoplasm of breast: Secondary | ICD-10-CM | POA: Diagnosis not present

## 2018-09-14 DIAGNOSIS — H5310 Unspecified subjective visual disturbances: Secondary | ICD-10-CM | POA: Diagnosis not present

## 2018-09-14 DIAGNOSIS — Z23 Encounter for immunization: Secondary | ICD-10-CM | POA: Diagnosis not present

## 2018-09-14 DIAGNOSIS — Z6831 Body mass index (BMI) 31.0-31.9, adult: Secondary | ICD-10-CM | POA: Diagnosis not present

## 2018-09-14 DIAGNOSIS — L820 Inflamed seborrheic keratosis: Secondary | ICD-10-CM | POA: Diagnosis not present

## 2018-09-14 DIAGNOSIS — Z0001 Encounter for general adult medical examination with abnormal findings: Secondary | ICD-10-CM | POA: Diagnosis not present

## 2018-09-14 DIAGNOSIS — Z1211 Encounter for screening for malignant neoplasm of colon: Secondary | ICD-10-CM | POA: Diagnosis not present

## 2018-09-14 DIAGNOSIS — Z136 Encounter for screening for cardiovascular disorders: Secondary | ICD-10-CM | POA: Diagnosis not present

## 2018-09-14 DIAGNOSIS — M7130 Other bursal cyst, unspecified site: Secondary | ICD-10-CM | POA: Diagnosis not present

## 2018-09-16 DIAGNOSIS — R5383 Other fatigue: Secondary | ICD-10-CM | POA: Diagnosis not present

## 2018-09-17 ENCOUNTER — Encounter: Payer: Medicare Other | Admitting: Nurse Practitioner

## 2018-09-17 ENCOUNTER — Ambulatory Visit: Payer: Medicare Other | Admitting: Nurse Practitioner

## 2018-09-17 ENCOUNTER — Ambulatory Visit: Payer: Self-pay

## 2018-09-21 DIAGNOSIS — Z96651 Presence of right artificial knee joint: Secondary | ICD-10-CM | POA: Diagnosis not present

## 2018-09-29 DIAGNOSIS — R2231 Localized swelling, mass and lump, right upper limb: Secondary | ICD-10-CM | POA: Diagnosis not present

## 2018-09-29 DIAGNOSIS — M79644 Pain in right finger(s): Secondary | ICD-10-CM | POA: Diagnosis not present

## 2018-10-07 ENCOUNTER — Other Ambulatory Visit: Payer: Self-pay | Admitting: Obstetrics and Gynecology

## 2018-10-07 ENCOUNTER — Ambulatory Visit
Admission: RE | Admit: 2018-10-07 | Discharge: 2018-10-07 | Disposition: A | Discharge status: Home / Self Care | Payer: Medicare Other | Source: Ambulatory Visit | Attending: Internal Medicine | Admitting: Internal Medicine

## 2018-10-07 ENCOUNTER — Other Ambulatory Visit: Payer: Self-pay

## 2018-10-07 DIAGNOSIS — Z1231 Encounter for screening mammogram for malignant neoplasm of breast: Secondary | ICD-10-CM | POA: Diagnosis not present

## 2018-10-21 DIAGNOSIS — M79644 Pain in right finger(s): Secondary | ICD-10-CM | POA: Diagnosis not present

## 2018-12-04 DIAGNOSIS — Z23 Encounter for immunization: Secondary | ICD-10-CM | POA: Diagnosis not present

## 2018-12-09 DIAGNOSIS — L738 Other specified follicular disorders: Secondary | ICD-10-CM | POA: Diagnosis not present

## 2018-12-09 DIAGNOSIS — L739 Follicular disorder, unspecified: Secondary | ICD-10-CM | POA: Diagnosis not present

## 2018-12-09 DIAGNOSIS — Z85828 Personal history of other malignant neoplasm of skin: Secondary | ICD-10-CM | POA: Diagnosis not present

## 2018-12-15 ENCOUNTER — Encounter: Payer: Self-pay | Admitting: Physician Assistant

## 2018-12-15 ENCOUNTER — Ambulatory Visit (INDEPENDENT_AMBULATORY_CARE_PROVIDER_SITE_OTHER): Payer: Medicare Other | Admitting: Physician Assistant

## 2018-12-15 VITALS — BP 140/82 | HR 100 | Temp 98.3°F | Ht 60.75 in | Wt 171.4 lb

## 2018-12-15 DIAGNOSIS — K219 Gastro-esophageal reflux disease without esophagitis: Secondary | ICD-10-CM | POA: Diagnosis not present

## 2018-12-15 DIAGNOSIS — Z1159 Encounter for screening for other viral diseases: Secondary | ICD-10-CM

## 2018-12-15 MED ORDER — OMEPRAZOLE 20 MG PO CPDR: 20.0000 mg | DELAYED_RELEASE_CAPSULE | Freq: Every day | ORAL | 3 refills | Status: DC

## 2018-12-15 NOTE — Patient Instructions (Addendum)
You have been scheduled for an endoscopy. Please follow written instructions given to you at your visit today. If you use inhalers (even only as needed), please bring them with you on the day of your procedure.  We have sent the following medications to your pharmacy for you to pick up at your convenience:  Omeprazole 20 mg daily   Change Famotidine to every evening as needed.

## 2018-12-15 NOTE — Progress Notes (Signed)
Chief Complaint: GERD, fecal frequency  HPI:    [name] is a 73 year old Caucasian female, known to Dr. Carlean Purl, who was referred to me by Audley Hose, MD for a complaint of GERD and fecal frequency.      05/15/2016 colonoscopy with diverticulosis in the sigmoid colon and otherwise normal.  No repeat recommended due to age.    Today, patient presents to clinic and explains that for 5 years off-and-on she has had problems with indigestion and has always just bought some over-the-counter acid reliever and only had to use it for a short period of time before it went away.  For the past year, she has now been using Pepcid 20 mg every morning and most recently over the past month or so her reflux has worsened to the point where she is having to use a second one in the afternoon/evening at times.  Tells me that before it was just specific foods that seemed to bother her and now it seems to be most things.  Over the past couple of weeks she has stopped eating greasy things, spicy things, caffeine, beef and even sugars and carbs as they seem to increase her symptoms.  She has lost about 8 pounds, but tells me that lots of foods give her reflux and also excessive burping. On 2-3 occasions she has even had some more frequent bowel movements after eating as well, related to this reflux.    Denies fever, chills, blood in her stool, nausea, vomiting or symptoms that awaken her from sleep.  Past Medical History:  Diagnosis Date  . Allergy   . Arthritis   . Contact dermatitis and other eczema, due to unspecified cause   . Depression   . Encounter for long-term (current) use of other medications   . Hyperlipidemia   . Other and unspecified hyperlipidemia   . Pain in joint, ankle and foot   . Screening for malignant neoplasm of the cervix     Past Surgical History:  Procedure Laterality Date  . ANKLE SURGERY  05/2006   Right Ankle  . Carpel TUnnel Release  [id number]   [id number]), Left(2006)   . CATARACT EXTRACTION Left 10/03/14  . CATARACT EXTRACTION Right 11/16/2014  . COLONOSCOPY    . DIAGNOSTIC LAPAROSCOPY  1980  . JOINT REPLACEMENT     Total Knee(L)-11/05/07  . KNEE ARTHROPLASTY  05/2007   Left  . RHINOPLASTY  1969  . TONSILLECTOMY     @ 73 y.o.  . TOTAL ANKLE ARTHROPLASTY  10/30/2011   Procedure: TOTAL ANKLE ARTHOPLASTY;  Surgeon: Wylene Simmer, MD;  Location: [place];  Service: Orthopedics;  Laterality: Right;  RIGHT TOTAL ANKLE REPLACEMENT, POSSIBLE GASTROC RESESSION  . TOTAL KNEE ARTHROPLASTY Right 10/30/2017   Procedure: RIGHT TOTAL KNEE ARTHROPLASTY;  Surgeon: Netta Cedars, MD;  Location: [place];  Service: Orthopedics;  Laterality: Right;  . TUBAL LIGATION  1980    Current Outpatient Medications  Medication Sig Dispense Refill  . acetaminophen (TYLENOL) 500 MG tablet Take 500-1,000 mg by mouth every 6 (six) hours as needed (for pain.).    Marland Kitchen aspirin (ASPIRIN CHILDRENS) 81 MG chewable tablet Chew 1 tablet (81 mg total) by mouth 2 (two) times daily. 60 tablet 0  . buPROPion (WELLBUTRIN XL) 150 MG 24 hr tablet TAKE ONE TABLET BY MOUTH ONE TIME DAILY  30 tablet 0  . Calcium-Phosphorus-Vitamin D (CALCIUM/D3 ADULT GUMMIES PO) Take 2 tablets by mouth daily.    . celecoxib (CELEBREX) 200 MG capsule  Take 200 mg by mouth daily.     . Cholecalciferol (VITAMIN D3) 2000 units TABS Take 2,000 Units by mouth daily.    . famotidine (PEPCID) 20 MG tablet Take 20 mg by mouth daily as needed for heartburn or indigestion.    Marland Kitchen levocetirizine (XYZAL) 5 MG tablet Take 5 mg by mouth daily.    Marland Kitchen losartan (COZAAR) 25 MG tablet Take 1 tablet by mouth daily.    . methocarbamol (ROBAXIN) 500 MG tablet Take 1 tablet (500 mg total) by mouth 3 (three) times daily as needed. 60 tablet 1  . montelukast (SINGULAIR) 10 MG tablet Take 10 mg by mouth at bedtime.    . Multiple Vitamin (MULTIVITAMIN WITH MINERALS) TABS Take 1 tablet by mouth daily.    . OPCON-A 0.027-0.315 % SOLN Place 1 drop into both eyes 2  (two) times daily as needed (allergy eyes).    . rosuvastatin (CRESTOR) 10 MG tablet Take 1 tablet by mouth daily.     Current Facility-Administered Medications  Medication Dose Route Frequency Provider Last Rate Last Dose  . 0.9 %  sodium chloride infusion  500 mL Intravenous Continuous Gatha Mayer, MD        Allergies as of 12/15/2018 - Review Complete 12/15/2018  Allergen Reaction Noted  . Morphine and related Itching 10/23/2011    Family History  Problem Relation Age of Onset  . Cancer Mother 50       breast  . Stomach cancer Maternal Grandfather   . Colon cancer Paternal Grandfather   . Esophageal cancer Neg Hx   . Rectal cancer Neg Hx     Social History   Socioeconomic History  . Marital status: Married    Spouse name: Not on file  . Number of children: Not on file  . Years of education: Not on file  . Highest education level: Not on file  Occupational History  . Not on file  Social Needs  . Financial resource strain: Not hard at all  . Food insecurity    Worry: Never true    Inability: Never true  . Transportation needs    Medical: No    Non-medical: No  Tobacco Use  . Smoking status: Former Smoker    Packs/day: 0.50    Years: 17.00    Pack years: 8.50    Types: Cigarettes    Quit date: 12/17/1979    Years since quitting: 39.0  . Smokeless tobacco: Never Used  Substance and Sexual Activity  . Alcohol use: Yes    Alcohol/week: 0.0 standard drinks    Comment: occasional- socially   . Drug use: No  . Sexual activity: Not Currently  Lifestyle  . Physical activity    Days per week: 7 days    Minutes per session: 50 min  . Stress: Only a little  Relationships  . Social connections    Talks on phone: More than three times a week    Gets together: More than three times a week    Attends religious service: More than 4 times per year    Active member of club or organization: Yes    Attends meetings of clubs or organizations: More than 4 times per  year    Relationship status: Married  . Intimate partner violence    Fear of current or ex partner: No    Emotionally abused: No    Physically abused: No    Forced sexual activity: No  Other Topics Concern  .  Not on file  Social History Narrative  . Not on file    Review of Systems:    Constitutional: No weight loss, fever or chills Cardiovascular: No chest pain  Respiratory: No SOB  Gastrointestinal: See HPI and otherwise negative   Physical Exam:  Vital signs: Temp 98.3 F (36.8 C)   Ht 5' 0.75" (1.543 m) Comment: height measured without shoes  Wt 171 lb 6 oz (77.7 kg)   BMI 32.65 kg/m   Constitutional:   Pleasant overweight Caucasian female appears to be in NAD, Well developed, Well nourished, alert and cooperative Respiratory: Respirations even and unlabored. Lungs clear to auscultation bilaterally.   No wheezes, crackles, or rhonchi.  Cardiovascular: Normal S1, S2. No MRG. Regular rate and rhythm. No peripheral edema, cyanosis or pallor.  Gastrointestinal:  Soft, nondistended, nontender. No rebound or guarding. Normal bowel sounds. No appreciable masses or hepatomegaly. Rectal:  Not performed.  Psychiatric: Demonstrates good judgement and reason without abnormal affect or behaviors.  No recent labs or imaging.  Assessment: 1.  GERD: Intermittent for 5 years, now constant over the past year with extreme increase over the past couple of months, regardless of antireflux diet and lifestyle modifications and Pepcid 20 mg twice daily; consider gastritis+/-H. Pylori+/-PUD  Plan: 1.  Patient wants to be scheduled for an EGD given the change in her symptoms.  Did discuss risks, benefits, limitations and alternatives the patient agrees to proceed.  This was scheduled with Dr. Carlean Purl in the [facility]. 2.  Started the patient on Omeprazole 20 mg every morning, 30 to 60 minutes before breakfast #30 with 3 refills. 3.  Explained that the patient can stop her Pepcid in the morning but she  can continue to use a dose in the evening as needed. 4.  Reviewed antireflux diet and lifestyle modifications 5.  Patient to follow in clinic per recommendations from Dr. Carlean Purl after time of procedure.  Ellouise Newer, PA-C [facility] 12/15/2018, 2:27 PM  Cc: Audley Hose, MD

## 2018-12-20 DIAGNOSIS — Z1159 Encounter for screening for other viral diseases: Secondary | ICD-10-CM | POA: Diagnosis not present

## 2018-12-21 LAB — SARS CORONAVIRUS 2 (TAT 6-24 HRS): SARS Coronavirus 2: NEGATIVE

## 2018-12-22 ENCOUNTER — Encounter: Payer: Self-pay | Admitting: Internal Medicine

## 2018-12-22 ENCOUNTER — Other Ambulatory Visit: Payer: Self-pay

## 2018-12-22 ENCOUNTER — Ambulatory Visit (AMBULATORY_SURGERY_CENTER): Payer: Medicare Other | Admitting: Internal Medicine

## 2018-12-22 VITALS — BP 139/80 | HR 70 | Temp 98.0°F | Resp 24 | Ht 60.0 in | Wt 171.0 lb

## 2018-12-22 DIAGNOSIS — K219 Gastro-esophageal reflux disease without esophagitis: Secondary | ICD-10-CM

## 2018-12-22 DIAGNOSIS — K449 Diaphragmatic hernia without obstruction or gangrene: Secondary | ICD-10-CM

## 2018-12-22 DIAGNOSIS — R12 Heartburn: Secondary | ICD-10-CM

## 2018-12-22 MED ORDER — SODIUM CHLORIDE 0.9 % IV SOLN: 500.0000 mL | Freq: Once | INTRAVENOUS | Status: DC

## 2018-12-22 NOTE — Patient Instructions (Addendum)
The esophagus was a bit tortuous - seen in older age - could not be squeezing 100% normal and having some spasm at times. Small hiatal hernia which leads to an increase in reflux.  Nothing bad here - no ulcers and no cancer.  I recommend returning to Pepcid 20 mg 2 times a day and trying to come off of that and maintaining a GERD diet. Try to use lowest effective dose of H2 blocker (Pepcid) (daily, as needed or none ) if possible. If this does not work then would try a different PPI  Than omeprazole(perhaps or she could be considered for a TIF procedure if desired. TIF is transoral incisionless fundoplication - a tightening of the lower esophageal sphincter by sort of stapling the valve betweeen esophagus and stomach. Not pushing for that but it is an option.  Please send a My Chart message or call if questions.  I appreciate the opportunity to care for you. Gatha Mayer, MD, FACG    YOU HAD AN ENDOSCOPIC PROCEDURE TODAY AT [facility]:   Refer to the procedure report that was given to you for any specific questions about what was found during the examination.  If the procedure report does not answer your questions, please call your gastroenterologist to clarify.  If you requested that your care partner not be given the details of your procedure findings, then the procedure report has been included in a sealed envelope for you to review at your convenience later.  YOU SHOULD EXPECT: Some feelings of bloating in the abdomen. Passage of more gas than usual.  Walking can help get rid of the air that was put into your GI tract during the procedure and reduce the bloating. If you had a lower endoscopy (such as a colonoscopy or flexible sigmoidoscopy) you may notice spotting of blood in your stool or on the toilet paper. If you underwent a bowel prep for your procedure, you may not have a normal bowel movement for a few days.  Please Note:  You might notice some irritation and  congestion in your nose or some drainage.  This is from the oxygen used during your procedure.  There is no need for concern and it should clear up in a day or so.  SYMPTOMS TO REPORT IMMEDIATELY:     Following upper endoscopy (EGD)  Vomiting of blood or coffee ground material  New chest pain or pain under the shoulder blades  Painful or persistently difficult swallowing  New shortness of breath  Fever of 100F or higher  Black, tarry-looking stools  For urgent or emergent issues, a gastroenterologist can be reached at any hour by calling [phone].   DIET:  We do recommend a small meal at first, but then you may proceed to your regular diet.  Drink plenty of fluids but you should avoid alcoholic beverages for 24 hours.  ACTIVITY:  You should plan to take it easy for the rest of today and you should NOT DRIVE or use heavy machinery until tomorrow (because of the sedation medicines used during the test).    FOLLOW UP: Our staff will call the number listed on your records 48-72 hours following your procedure to check on you and address any questions or concerns that you may have regarding the information given to you following your procedure. If we do not reach you, we will leave a message.  We will attempt to reach you two times.  During this call, we will ask  if you have developed any symptoms of COVID 19. If you develop any symptoms (ie: fever, flu-like symptoms, shortness of breath, cough etc.) before then, please call [phone].  If you test positive for Covid 19 in the 2 weeks post procedure, please call and report this information to [place].    If any biopsies were taken you will be contacted by phone or by letter within the next 1-3 weeks.  Please call us at [phone] if you have not heard about the biopsies in 3 weeks.    SIGNATURES/CONFIDENTIALITY: You and/or your care partner have signed paperwork which will be entered into your electronic medical record.  These  signatures attest to the fact that that the information above on your After Visit Summary has been reviewed and is understood.  Full responsibility of the confidentiality of this discharge information lies with you and/or your care-partner.

## 2018-12-22 NOTE — Progress Notes (Signed)
Pt Drowsy. VSS. To PACU, report to RN. No anesthetic complications noted.  

## 2018-12-22 NOTE — Op Note (Signed)
[facility] Patient Name: [name] Procedure Date: 12/22/2018 10:23 AM MRN: [id number] Endoscopist: Gatha Mayer , MD Age: 73 Referring MD:  Date of Birth: Feb 20, 1945 Gender: Female Account #: [id number] Procedure:                Upper GI endoscopy Indications:              Heartburn, Esophageal reflux symptoms that persist                            despite appropriate therapy Medicines:                Propofol per Anesthesia, Monitored Anesthesia Care Procedure:                Pre-Anesthesia Assessment:                           - Prior to the procedure, a History and Physical                            was performed, and patient medications and                            allergies were reviewed. The patient's tolerance of                            previous anesthesia was also reviewed. The risks                            and benefits of the procedure and the sedation                            options and risks were discussed with the patient.                            All questions were answered, and informed consent                            was obtained. Prior Anticoagulants: The patient has                            taken no previous anticoagulant or antiplatelet                            agents. ASA Grade Assessment: III - A patient with                            severe systemic disease. After reviewing the risks                            and benefits, the patient was deemed in                            satisfactory condition to undergo the procedure.  After obtaining informed consent, the endoscope was                            passed under direct vision. Throughout the                            procedure, the patient's blood pressure, pulse, and                            oxygen saturations were monitored continuously. The                            Endoscope was introduced through the mouth, and        advanced to the second part of duodenum. The upper                            GI endoscopy was accomplished without difficulty.                            The patient tolerated the procedure well. Scope In: Scope Out: Findings:                 The examined esophagus was moderately tortuous.                           A 1-2 cm sliding hiatal hernia was found.                           The gastroesophageal flap valve was visualized                            endoscopically and classified as Hill Grade III                            (minimal fold, loose to endoscope, hiatal hernia                            likely).                           The exam was otherwise without abnormality.                           The cardia and gastric fundus were normal on                            retroflexion. Complications:            No immediate complications. Estimated Blood Loss:     Estimated blood loss: none. Impression:               - Tortuous esophagus.                           - 1-2 cm sliding hiatal hernia.                           -  Gastroesophageal flap valve classified as Hill                            Grade III (minimal fold, loose to endoscope, hiatal                            hernia likely).                           - The examination was otherwise normal.                           - No specimens collected. Recommendation:           - Patient has a contact number available for                            emergencies. The signs and symptoms of potential                            delayed complications were discussed with the                            patient. Return to normal activities tomorrow.                            Written discharge instructions were provided to the                            patient.                           - Resume previous diet.                           - Continue present medications.                           - She has improved with better diet  restriction and                            omeprazole treatment but has noticed gas and                            belching probably since starting omeprazole.                           I recommend returning to Pepcid 20 mg 2 times a day                            and trying to come off of that and maintaining a                            GERD diet. Try to use lowest effective dose of H2  blocker (daily, prn, none ) if possible. If this                            does not work then would try a different PPI                            perhaps or she could be considered for a TIF                            procedure if desired. Gatha Mayer, MD 12/22/2018 10:52:27 AM This report has been signed electronically.

## 2018-12-22 NOTE — Progress Notes (Signed)
TEMP-JB  V/S-CW 

## 2018-12-24 ENCOUNTER — Telehealth: Payer: Self-pay

## 2018-12-24 NOTE — Telephone Encounter (Signed)
  Follow up Call-  Call back number 12/22/2018 05/15/2016  Post procedure Call Back phone  # [phone]  Permission to leave phone message Yes Yes  Some recent data might be hidden     Patient questions:  Do you have a fever, pain , or abdominal swelling? No. Pain Score  0 *  Have you tolerated food without any problems? Yes.    Have you been able to return to your normal activities? Yes.    Do you have any questions about your discharge instructions: Diet   No. Medications  No. Follow up visit  No.  Do you have questions or concerns about your Care? No.  Actions: * If pain score is 4 or above: No action needed, pain <4.  1. Have you developed a fever since your procedure? no  2.   Have you had an respiratory symptoms (SOB or cough) since your procedure? no  3.   Have you tested positive for COVID 19 since your procedure no  4.   Have you had any family members/close contacts diagnosed with the COVID 19 since your procedure?  no   If yes to any of these questions please route to Joylene John, RN and Alphonsa Gin, Therapist, sports.

## 2019-01-04 DIAGNOSIS — Z6831 Body mass index (BMI) 31.0-31.9, adult: Secondary | ICD-10-CM | POA: Diagnosis not present

## 2019-01-04 DIAGNOSIS — F329 Major depressive disorder, single episode, unspecified: Secondary | ICD-10-CM | POA: Diagnosis not present

## 2019-01-04 DIAGNOSIS — Z23 Encounter for immunization: Secondary | ICD-10-CM | POA: Diagnosis not present

## 2019-01-04 DIAGNOSIS — I10 Essential (primary) hypertension: Secondary | ICD-10-CM | POA: Diagnosis not present

## 2019-01-04 DIAGNOSIS — R5383 Other fatigue: Secondary | ICD-10-CM | POA: Diagnosis not present

## 2019-01-24 DIAGNOSIS — M545 Low back pain: Secondary | ICD-10-CM | POA: Diagnosis not present

## 2019-03-18 DIAGNOSIS — Z96661 Presence of right artificial ankle joint: Secondary | ICD-10-CM | POA: Diagnosis not present

## 2019-03-18 DIAGNOSIS — M25571 Pain in right ankle and joints of right foot: Secondary | ICD-10-CM | POA: Diagnosis not present

## 2019-03-22 DIAGNOSIS — J3081 Allergic rhinitis due to animal (cat) (dog) hair and dander: Secondary | ICD-10-CM | POA: Diagnosis not present

## 2019-03-22 DIAGNOSIS — J3089 Other allergic rhinitis: Secondary | ICD-10-CM | POA: Diagnosis not present

## 2019-03-22 DIAGNOSIS — J301 Allergic rhinitis due to pollen: Secondary | ICD-10-CM | POA: Diagnosis not present

## 2019-03-25 DIAGNOSIS — L814 Other melanin hyperpigmentation: Secondary | ICD-10-CM | POA: Diagnosis not present

## 2019-03-25 DIAGNOSIS — Z85828 Personal history of other malignant neoplasm of skin: Secondary | ICD-10-CM | POA: Diagnosis not present

## 2019-03-25 DIAGNOSIS — L570 Actinic keratosis: Secondary | ICD-10-CM | POA: Diagnosis not present

## 2019-03-25 DIAGNOSIS — L821 Other seborrheic keratosis: Secondary | ICD-10-CM | POA: Diagnosis not present

## 2019-03-25 DIAGNOSIS — D1801 Hemangioma of skin and subcutaneous tissue: Secondary | ICD-10-CM | POA: Diagnosis not present

## 2019-03-25 DIAGNOSIS — D225 Melanocytic nevi of trunk: Secondary | ICD-10-CM | POA: Diagnosis not present

## 2019-04-13 DIAGNOSIS — Z6831 Body mass index (BMI) 31.0-31.9, adult: Secondary | ICD-10-CM | POA: Diagnosis not present

## 2019-04-13 DIAGNOSIS — F329 Major depressive disorder, single episode, unspecified: Secondary | ICD-10-CM | POA: Diagnosis not present

## 2019-04-13 DIAGNOSIS — E785 Hyperlipidemia, unspecified: Secondary | ICD-10-CM | POA: Diagnosis not present

## 2019-04-13 DIAGNOSIS — I10 Essential (primary) hypertension: Secondary | ICD-10-CM | POA: Diagnosis not present

## 2019-04-13 DIAGNOSIS — R5383 Other fatigue: Secondary | ICD-10-CM | POA: Diagnosis not present

## 2019-07-05 DIAGNOSIS — L0889 Other specified local infections of the skin and subcutaneous tissue: Secondary | ICD-10-CM | POA: Diagnosis not present

## 2019-07-05 DIAGNOSIS — L02818 Cutaneous abscess of other sites: Secondary | ICD-10-CM | POA: Diagnosis not present

## 2019-07-05 DIAGNOSIS — Z85828 Personal history of other malignant neoplasm of skin: Secondary | ICD-10-CM | POA: Diagnosis not present

## 2019-07-05 DIAGNOSIS — L720 Epidermal cyst: Secondary | ICD-10-CM | POA: Diagnosis not present

## 2019-07-25 DIAGNOSIS — H26493 Other secondary cataract, bilateral: Secondary | ICD-10-CM | POA: Diagnosis not present

## 2019-07-25 DIAGNOSIS — Z961 Presence of intraocular lens: Secondary | ICD-10-CM | POA: Diagnosis not present

## 2019-09-20 DIAGNOSIS — I10 Essential (primary) hypertension: Secondary | ICD-10-CM | POA: Diagnosis not present

## 2019-09-20 DIAGNOSIS — R5383 Other fatigue: Secondary | ICD-10-CM | POA: Diagnosis not present

## 2019-09-20 DIAGNOSIS — E785 Hyperlipidemia, unspecified: Secondary | ICD-10-CM | POA: Diagnosis not present

## 2019-09-23 DIAGNOSIS — Z1211 Encounter for screening for malignant neoplasm of colon: Secondary | ICD-10-CM | POA: Diagnosis not present

## 2019-09-23 DIAGNOSIS — N179 Acute kidney failure, unspecified: Secondary | ICD-10-CM | POA: Diagnosis not present

## 2019-09-23 DIAGNOSIS — Z6831 Body mass index (BMI) 31.0-31.9, adult: Secondary | ICD-10-CM | POA: Diagnosis not present

## 2019-09-23 DIAGNOSIS — Z1231 Encounter for screening mammogram for malignant neoplasm of breast: Secondary | ICD-10-CM | POA: Diagnosis not present

## 2019-09-23 DIAGNOSIS — Z23 Encounter for immunization: Secondary | ICD-10-CM | POA: Diagnosis not present

## 2019-09-23 DIAGNOSIS — Z13820 Encounter for screening for osteoporosis: Secondary | ICD-10-CM | POA: Diagnosis not present

## 2019-09-23 DIAGNOSIS — H5310 Unspecified subjective visual disturbances: Secondary | ICD-10-CM | POA: Diagnosis not present

## 2019-09-23 DIAGNOSIS — Z0001 Encounter for general adult medical examination with abnormal findings: Secondary | ICD-10-CM | POA: Diagnosis not present

## 2019-09-23 DIAGNOSIS — F329 Major depressive disorder, single episode, unspecified: Secondary | ICD-10-CM | POA: Diagnosis not present

## 2019-09-23 DIAGNOSIS — I10 Essential (primary) hypertension: Secondary | ICD-10-CM | POA: Diagnosis not present

## 2019-09-23 DIAGNOSIS — M19079 Primary osteoarthritis, unspecified ankle and foot: Secondary | ICD-10-CM | POA: Diagnosis not present

## 2019-09-23 DIAGNOSIS — E785 Hyperlipidemia, unspecified: Secondary | ICD-10-CM | POA: Diagnosis not present

## 2019-10-11 ENCOUNTER — Other Ambulatory Visit: Payer: Self-pay | Admitting: Internal Medicine

## 2019-10-11 DIAGNOSIS — Z1231 Encounter for screening mammogram for malignant neoplasm of breast: Secondary | ICD-10-CM

## 2019-10-12 ENCOUNTER — Ambulatory Visit
Admission: RE | Admit: 2019-10-12 | Discharge: 2019-10-12 | Disposition: A | Discharge status: Home / Self Care | Payer: Medicare Other | Source: Ambulatory Visit | Attending: Internal Medicine | Admitting: Internal Medicine

## 2019-10-12 ENCOUNTER — Other Ambulatory Visit: Payer: Self-pay

## 2019-10-12 DIAGNOSIS — Z1231 Encounter for screening mammogram for malignant neoplasm of breast: Secondary | ICD-10-CM | POA: Diagnosis not present

## 2019-10-13 DIAGNOSIS — M67962 Unspecified disorder of synovium and tendon, left lower leg: Secondary | ICD-10-CM | POA: Diagnosis not present

## 2019-10-13 DIAGNOSIS — M79672 Pain in left foot: Secondary | ICD-10-CM | POA: Diagnosis not present

## 2019-10-20 DIAGNOSIS — M25561 Pain in right knee: Secondary | ICD-10-CM | POA: Diagnosis not present

## 2019-10-20 DIAGNOSIS — M25562 Pain in left knee: Secondary | ICD-10-CM | POA: Diagnosis not present

## 2019-10-20 DIAGNOSIS — M25512 Pain in left shoulder: Secondary | ICD-10-CM | POA: Diagnosis not present

## 2019-10-20 DIAGNOSIS — Z96651 Presence of right artificial knee joint: Secondary | ICD-10-CM | POA: Diagnosis not present

## 2019-10-20 DIAGNOSIS — Z96659 Presence of unspecified artificial knee joint: Secondary | ICD-10-CM | POA: Diagnosis not present

## 2019-10-20 DIAGNOSIS — Z96652 Presence of left artificial knee joint: Secondary | ICD-10-CM | POA: Diagnosis not present

## 2019-10-24 DIAGNOSIS — M1990 Unspecified osteoarthritis, unspecified site: Secondary | ICD-10-CM | POA: Diagnosis not present

## 2019-10-24 DIAGNOSIS — I10 Essential (primary) hypertension: Secondary | ICD-10-CM | POA: Diagnosis not present

## 2019-11-01 ENCOUNTER — Other Ambulatory Visit: Payer: Self-pay

## 2019-11-01 ENCOUNTER — Ambulatory Visit (HOSPITAL_COMMUNITY): Payer: Medicare Other | Attending: Student | Admitting: Physical Therapy

## 2019-11-01 ENCOUNTER — Encounter (HOSPITAL_COMMUNITY): Payer: Self-pay | Admitting: Physical Therapy

## 2019-11-01 DIAGNOSIS — M6281 Muscle weakness (generalized): Secondary | ICD-10-CM

## 2019-11-01 DIAGNOSIS — R29898 Other symptoms and signs involving the musculoskeletal system: Secondary | ICD-10-CM | POA: Insufficient documentation

## 2019-11-01 DIAGNOSIS — M25572 Pain in left ankle and joints of left foot: Secondary | ICD-10-CM | POA: Insufficient documentation

## 2019-11-01 DIAGNOSIS — R2689 Other abnormalities of gait and mobility: Secondary | ICD-10-CM | POA: Diagnosis not present

## 2019-11-01 NOTE — Therapy (Signed)
[place], [place], [address] Phone: [phone]   Fax:  [phone]  Physical Therapy Evaluation  Patient Details  Name: [name] MRN: [id number] Date of Birth: [birth year] Referring Provider (PT): Mechele Claude PA-C   Encounter Date: 11/01/2019   PT End of Session - 11/01/19 1541    Visit Number 1    Number of Visits 12    Date for PT Re-Evaluation 12/13/19    Authorization Type Primary Medicare secondary [employer] combined ST/OT/PT 60 visits 0 used no auth needed following MCR guidlines    Authorization - Visit Number 1    Authorization - Number of Visits 60    Progress Note Due on Visit 10    PT Start Time 1528    PT Stop Time 1617    PT Time Calculation (min) 49 min    Activity Tolerance Patient tolerated treatment well    Behavior During Therapy [facility] for tasks assessed/performed           Past Medical History:  Diagnosis Date  . Allergy   . Arthritis   . Cataract    BILATERAL-REMOVED  . Contact dermatitis and other eczema, due to unspecified cause   . Depression   . Encounter for long-term (current) use of other medications   . GERD (gastroesophageal reflux disease)   . Hyperlipidemia   . Hypertension   . Osteopenia   . Other and unspecified hyperlipidemia   . Pain in joint, ankle and foot   . Screening for malignant neoplasm of the cervix     Past Surgical History:  Procedure Laterality Date  . ANKLE SURGERY  05/2006   Right Ankle  . Carpel TUnnel Release  [id number]   [id number]), Left(2006)  . CATARACT EXTRACTION Left 10/03/14  . CATARACT EXTRACTION Right 11/16/2014  . COLONOSCOPY    . DIAGNOSTIC LAPAROSCOPY  1980  . FINGER SURGERY Right    ring finger  . JOINT REPLACEMENT     Total Knee(L)-11/05/07  . KNEE ARTHROPLASTY  05/2007   Left  . RHINOPLASTY  1969  . TONSILLECTOMY     @ 74 y.o.  . TOTAL ANKLE ARTHROPLASTY  10/30/2011   Procedure: TOTAL ANKLE ARTHOPLASTY;  Surgeon:  Wylene Simmer, MD;  Location: [facility];  Service: Orthopedics;  Laterality: Right;  RIGHT TOTAL ANKLE REPLACEMENT, POSSIBLE GASTROC RESESSION  . TOTAL KNEE ARTHROPLASTY Right 10/30/2017   Procedure: RIGHT TOTAL KNEE ARTHROPLASTY;  Surgeon: Netta Cedars, MD;  Location: [facility];  Service: Orthopedics;  Laterality: Right;  . TUBAL LIGATION  1980    There were no vitals filed for this visit.    Subjective Assessment - 11/01/19 1528    Subjective Patient is a 74 y.o. female who presents to physical therapy with posterior tibialis tendonitis of her L foot. Patient states she has had 12 ortho surgeries since she retired. 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 She stated her foot hurt really bad and she began icing and taking Tylenol at the time. It has sense been nagging pain and she feels it with every step. She uses voltaren gel, pain patches. She has tried new orthotics, Tylenol. They also want her to have iontophoresis. She will be going out of town and coming back Oct. 6. Her main goal is to get her foot feeling better. She swims .5-1 mile at a time and after about 15 minutes it starts nagging her.  R ankle replaced 8 years ago.    Limitations Walking;Standing;House hold activities    Patient Stated Goals decrease symptoms    Currently in Pain? No/denies   worst 1/10             [facility] PT Assessment - 11/01/19 0001      Assessment   Medical Diagnosis Posterior Tibialis Tendonitis    Referring Provider (PT) Mechele Claude PA-C    Onset Date/Surgical Date 05/18/19    Next MD Visit in 6 weeks if she needs it    Prior Therapy yes      Precautions   Precautions None      Restrictions   Weight Bearing Restrictions No      Balance Screen   Has the patient fallen in the past 6 months Yes    How many times? 1    Has the patient had a decrease in activity level because of a fear of falling?  No    Is the patient reluctant to leave their home  because of a fear of falling?  No      Prior Function   Level of Independence Independent    Vocation Retired      Charity fundraiser Status Within Functional Limits for tasks assessed      Observation/Other Assessments   Observations Ambulates without AD    Focus on Therapeutic Outcomes (FOTO)  24% limited      ROM / Strength   AROM / PROM / Strength AROM;Strength      AROM   AROM Assessment Site Ankle    Right/Left Ankle Right;Left    Left Ankle Dorsiflexion 4    Left Ankle Plantar Flexion 51    Left Ankle Inversion 25    Left Ankle Eversion 7      Strength   Strength Assessment Site Hip;Knee;Ankle    Right/Left Hip Left;Right    Right Hip Flexion 4/5    Left Hip Flexion 4/5    Right/Left Knee Right;Left    Right Knee Flexion 5/5    Right Knee Extension 5/5    Left Knee Flexion 5/5    Left Knee Extension 5/5    Right/Left Ankle Right;Left    Right Ankle Dorsiflexion 5/5    Right Ankle Inversion 5/5    Right Ankle Eversion 5/5    Left Ankle Dorsiflexion 4/5    Left Ankle Inversion 4/5    Left Ankle Eversion 4/5      Palpation   Palpation comment TTP insertion of L posterior tib at navicular tuberosity. not tender throughout LLE; hypomobile AP glide of L talocrual joint      Ambulation/Gait   Ambulation/Gait Yes    Ambulation/Gait Assistance 7: Independent    Ambulation Distance (Feet) 525 Feet    Assistive device None    Gait Pattern Antalgic    Ambulation Surface Level;Indoor    Gait Comments 2MWT, gradually increasing pain and antalgic gait                      Objective measurements completed on examination: See above findings.       [facility] Adult PT Treatment/Exercise - 11/01/19 0001      Exercises   Exercises Ankle      Ankle Exercises: Stretches   Gastroc Stretch 3 reps;30 seconds    Gastroc Stretch Limitations at counter      Ankle Exercises: Seated   Other Seated Ankle Exercises foot doming 2x10  PT Education - 11/01/19 1528    Education Details Patient educated on exam findinds, POC, scope of PT, intial HEP, posterior tib dysfunction    Person(s) Educated Patient    Methods Explanation;Demonstration;Handout    Comprehension Verbalized understanding;Returned demonstration            PT Short Term Goals - 11/01/19 1628      PT SHORT TERM GOAL #1   Title Patient will be independent with HEP in order to improve functional outcomes.    Time 3    Period Weeks    Status New    Target Date 11/22/19      PT SHORT TERM GOAL #2   Title Patient will report at least 25% improvement in symptoms for improved quality of life.    Time 3    Period Weeks    Status New    Target Date 11/22/19             PT Long Term Goals - 11/01/19 1629      PT LONG TERM GOAL #1   Title Patient will report at least 75% improvement in symptoms for improved quality of life.    Time 6    Period Weeks    Status New    Target Date 12/13/19      PT LONG TERM GOAL #2   Title Patient will improve FOTO score by at least 5 points in order to indicate improved tolerance to activity.    Time 6    Period Weeks    Status New    Target Date 12/13/19      PT LONG TERM GOAL #3   Title Patient will be able to ambulate for at least 10 minutes with pain no greater than 1/10 in order to demonstrate improved ability to ambulate in the community.    Time 6    Period Weeks    Status New    Target Date 12/13/19                  Plan - 11/01/19 1624    Clinical Impression Statement Patient is a 74 y.o. female who presents to physical therapy with posterior tibialis tendonitis of her L foot which began after acute increase in weightbearing/ambulating. She presents with pain limited deficits in L ankle/foot strength, ROM, endurance, joint mobility, and functional mobility with ADL. She is having to modify and restrict ADL as indicated by FOTO score as well as subjective information and  objective measures which is affecting overall participation. Patient will benefit from skilled physical therapy in order to improve function and reduce impairment.    Personal Factors and Comorbidities Age;Behavior Pattern;Past/Current Experience;Time since onset of injury/illness/exacerbation    Examination-Activity Limitations Locomotion Level;Stairs;Squat;Stand    Examination-Participation Restrictions Yard Work;Shop;Community Activity    Stability/Clinical Decision Making Stable/Uncomplicated    Clinical Decision Making Low    Rehab Potential Good    PT Frequency 2x / week    PT Duration 6 weeks    PT Treatment/Interventions ADLs/Self Care Home Management;Aquatic Therapy;Cryotherapy;Electrical Stimulation;Iontophoresis 4mg /ml Dexamethasone;Moist Heat;Traction;Ultrasound;DME Instruction;Gait training;Stair training;Functional mobility training;Therapeutic activities;Parrafin;Contrast Bath;Fluidtherapy;Therapeutic exercise;Balance training;Patient/family education;Neuromuscular re-education;Manual techniques;Compression bandaging;Passive range of motion;Dry needling;Energy conservation;Splinting;Taping;Vasopneumatic Device;Spinal Manipulations;Joint Manipulations    PT Next Visit Plan begin ionto, begin posterior tib strengtheing, possibly manual for pain relief/ DF mobility, balance training, begin ankle/ intrensic foot strengthing    PT Home Exercise Plan 9/21 calf stretch, foot doming    Consulted and Agree with Plan of Care Patient  Patient will benefit from skilled therapeutic intervention in order to improve the following deficits and impairments:  Abnormal gait, Decreased range of motion, Decreased endurance, Pain, Decreased strength, Hypomobility, Impaired flexibility, Decreased mobility, Decreased balance, Decreased activity tolerance  Visit Diagnosis: Pain in left ankle and joints of left foot  Muscle weakness (generalized)  Other abnormalities of gait and  mobility  Other symptoms and signs involving the musculoskeletal system     Problem List Patient Active Problem List   Diagnosis Date Noted  . Status post total knee replacement, right 10/30/2017  . Obesity (BMI 30.0-34.9) 04/30/2017  . 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 Vaginal dryness, menopausal 08/21/2014  . Osteopenia 12/14/2013  . Essential hypertension 12/14/2013  . Complex regional pain syndrome of lower limb 10/04/2012  . Hyperlipidemia 10/04/2012  . Allergic rhinitis 10/04/2012  . Generalized osteoarthrosis, involving multiple sites 10/04/2012    4:32 PM, 11/01/19 Mearl Latin PT, DPT Physical Therapist at [place], [place], [address] Phone: [phone]   Fax:  [phone]  Name: [name] MRN: [id number] Date of Birth: 1945-07-17

## 2019-11-17 ENCOUNTER — Encounter (HOSPITAL_COMMUNITY): Payer: Self-pay | Admitting: Physical Therapy

## 2019-11-17 ENCOUNTER — Other Ambulatory Visit: Payer: Self-pay

## 2019-11-17 ENCOUNTER — Ambulatory Visit (HOSPITAL_COMMUNITY): Payer: Medicare Other | Attending: Student | Admitting: Physical Therapy

## 2019-11-17 DIAGNOSIS — R2689 Other abnormalities of gait and mobility: Secondary | ICD-10-CM

## 2019-11-17 DIAGNOSIS — M6281 Muscle weakness (generalized): Secondary | ICD-10-CM | POA: Diagnosis not present

## 2019-11-17 DIAGNOSIS — R29898 Other symptoms and signs involving the musculoskeletal system: Secondary | ICD-10-CM

## 2019-11-17 DIAGNOSIS — M25572 Pain in left ankle and joints of left foot: Secondary | ICD-10-CM

## 2019-11-17 NOTE — Therapy (Signed)
[place], [place], [address] Phone: [phone]   Fax:  [phone]  Physical Therapy Treatment  Patient Details  Name: [name] MRN: [id number] Date of Birth: 1946-01-13 Referring Provider (PT): Mechele Claude PA-C   Encounter Date: 11/17/2019   PT End of Session - 11/17/19 1202    Visit Number 2    Number of Visits 12    Date for PT Re-Evaluation 12/13/19    Authorization Type Primary Medicare secondary [employer] combined ST/OT/PT 60 visits 0 used no auth needed following MCR guidlines    Authorization - Visit Number 2    Authorization - Number of Visits 60    Progress Note Due on Visit 10    PT Start Time 1048    PT Stop Time 1128    PT Time Calculation (min) 40 min    Activity Tolerance Patient tolerated treatment well    Behavior During Therapy [facility] for tasks assessed/performed           Past Medical History:  Diagnosis Date  . Allergy   . Arthritis   . Cataract    BILATERAL-REMOVED  . Contact dermatitis and other eczema, due to unspecified cause   . Depression   . Encounter for long-term (current) use of other medications   . GERD (gastroesophageal reflux disease)   . Hyperlipidemia   . Hypertension   . Osteopenia   . Other and unspecified hyperlipidemia   . Pain in joint, ankle and foot   . Screening for malignant neoplasm of the cervix     Past Surgical History:  Procedure Laterality Date  . ANKLE SURGERY  05/2006   Right Ankle  . Carpel TUnnel Release  [id number]   [id number]), Left(2006)  . CATARACT EXTRACTION Left 10/03/14  . CATARACT EXTRACTION Right 11/16/2014  . COLONOSCOPY    . DIAGNOSTIC LAPAROSCOPY  1980  . FINGER SURGERY Right    ring finger  . JOINT REPLACEMENT     Total Knee(L)-11/05/07  . KNEE ARTHROPLASTY  05/2007   Left  . RHINOPLASTY  1969  . TONSILLECTOMY     @ 74 y.o.  . TOTAL ANKLE ARTHROPLASTY  10/30/2011   Procedure: TOTAL ANKLE ARTHOPLASTY;  Surgeon: Wylene Simmer, MD;  Location: [facility];  Service: Orthopedics;  Laterality: Right;  RIGHT TOTAL ANKLE REPLACEMENT, POSSIBLE GASTROC RESESSION  . TOTAL KNEE ARTHROPLASTY Right 10/30/2017   Procedure: RIGHT TOTAL KNEE ARTHROPLASTY;  Surgeon: Netta Cedars, MD;  Location: [place];  Service: Orthopedics;  Laterality: Right;  . TUBAL LIGATION  1980    There were no vitals filed for this visit.   Subjective Assessment - 11/17/19 1052    Subjective Pt states that her ankle is about the same.  If she wears her sneakers she does okay but is unable toe wear her sandles.    Limitations Walking;Standing;House hold activities    Currently in Pain? Yes    Pain Score 2     Pain Location Ankle    Pain Orientation Left    Pain Descriptors / Indicators Sore    Pain Type Chronic pain    Pain Onset More than a month ago    Pain Frequency Constant    Aggravating Factors  walking    Pain Relieving Factors rest    Effect of Pain on Daily Activities powers through  [facility] Adult PT Treatment/Exercise - 11/17/19 0001      Exercises   Exercises Ankle      Modalities   Modalities Iontophoresis      Iontophoresis   Type of Iontophoresis Dexamethasone    Location Lt instep of foot     Dose 80MA    Time 4 hr      Ankle Exercises: Standing   SLS x4 B       Ankle Exercises: Seated   Towel Crunch 3 reps    Towel Inversion/Eversion 5 reps    BAPS Sitting;Level 2;10 reps    Other Seated Ankle Exercises hallicus longus stretch 2 x 30"                    PT Short Term Goals - 11/17/19 1056      PT SHORT TERM GOAL #1   Title Patient will be independent with HEP in order to improve functional outcomes.    Time 3    Period Weeks    Status On-going    Target Date 11/22/19      PT SHORT TERM GOAL #2   Title Patient will report at least 25% improvement in symptoms for improved quality of life.    Time 3    Period Weeks    Status On-going    Target Date  11/22/19      PT SHORT TERM GOAL #3   Status On-going             PT Long Term Goals - 11/17/19 1057      PT LONG TERM GOAL #1   Title Patient will report at least 75% improvement in symptoms for improved quality of life.    Time 6    Period Weeks    Status On-going      PT LONG TERM GOAL #2   Title Patient will improve FOTO score by at least 5 points in order to indicate improved tolerance to activity.    Time 6    Period Weeks    Status On-going      PT LONG TERM GOAL #3   Title Patient will be able to ambulate for at least 10 minutes with pain no greater than 1/10 in order to demonstrate improved ability to ambulate in the community.    Time 6    Period Weeks    Status On-going      PT LONG TERM GOAL #4   Status On-going      PT LONG TERM GOAL #5   Status On-going                 Plan - 11/17/19 1050    Clinical Impression Statement Evaluation and goals reviewed with pt. Instructed pt in strengthening exercises as well as stretching per plan of care. PT able to complete all exercises with good technique following verbal cuing.  Iontophoresis explained and started.    Personal Factors and Comorbidities Age;Behavior Pattern;Past/Current Experience;Time since onset of injury/illness/exacerbation    Examination-Activity Limitations Locomotion Level;Stairs;Squat;Stand    Examination-Participation Restrictions Yard Work;Shop;Community Activity    Stability/Clinical Decision Making Stable/Uncomplicated    Rehab Potential Good    PT Frequency 2x / week    PT Duration 6 weeks    PT Treatment/Interventions ADLs/Self Care Home Management;Aquatic Therapy;Cryotherapy;Electrical Stimulation;Iontophoresis 4mg /ml Dexamethasone;Moist Heat;Traction;Ultrasound;DME Instruction;Gait training;Stair training;Functional mobility training;Therapeutic activities;Parrafin;Contrast Bath;Fluidtherapy;Therapeutic exercise;Balance training;Patient/family education;Neuromuscular  re-education;Manual techniques;Compression bandaging;Passive range of motion;Dry needling;Energy conservation;Splinting;Taping;Vasopneumatic Device;Spinal Manipulations;Joint Manipulations    PT Next  Visit Plan possibly manual for pain relief/ DF mobility, balance training, begin ankle/ intrensic foot strengthing    PT Home Exercise Plan 9/21 calf stretch, foot doming; 10/7:  towel crunch, towel in/eversion, Hallicus longus stretch and single leg stance.    Consulted and Agree with Plan of Care Patient           Patient will benefit from skilled therapeutic intervention in order to improve the following deficits and impairments:  Abnormal gait, Decreased range of motion, Decreased endurance, Pain, Decreased strength, Hypomobility, Impaired flexibility, Decreased mobility, Decreased balance, Decreased activity tolerance  Visit Diagnosis: Pain in left ankle and joints of left foot  Muscle weakness (generalized)  Other abnormalities of gait and mobility  Other symptoms and signs involving the musculoskeletal system     Problem List Patient Active Problem List   Diagnosis Date Noted  . Status post total knee replacement, right 10/30/2017  . Obesity (BMI 30.0-34.9) 04/30/2017  . 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 Vaginal dryness, menopausal 08/21/2014  . Osteopenia 12/14/2013  . Essential hypertension 12/14/2013  . Complex regional pain syndrome of lower limb 10/04/2012  . Hyperlipidemia 10/04/2012  . Allergic rhinitis 10/04/2012  . Generalized osteoarthrosis, involving multiple sites 10/04/2012   Rayetta Humphrey, PT CLT [phone] 11/17/2019, 12:07 PM  [place], [place], [address] Phone: [phone]   Fax:  [phone]  Name: [name] MRN: [id number] Date of Birth: 23-Sep-1945

## 2019-11-18 DIAGNOSIS — Z23 Encounter for immunization: Secondary | ICD-10-CM | POA: Diagnosis not present

## 2019-11-21 ENCOUNTER — Encounter (HOSPITAL_COMMUNITY): Payer: Self-pay | Admitting: Physical Therapy

## 2019-11-21 ENCOUNTER — Other Ambulatory Visit: Payer: Self-pay

## 2019-11-21 ENCOUNTER — Ambulatory Visit (HOSPITAL_COMMUNITY): Payer: Medicare Other | Admitting: Physical Therapy

## 2019-11-21 DIAGNOSIS — R29898 Other symptoms and signs involving the musculoskeletal system: Secondary | ICD-10-CM

## 2019-11-21 DIAGNOSIS — M25572 Pain in left ankle and joints of left foot: Secondary | ICD-10-CM

## 2019-11-21 DIAGNOSIS — M6281 Muscle weakness (generalized): Secondary | ICD-10-CM | POA: Diagnosis not present

## 2019-11-21 DIAGNOSIS — R2689 Other abnormalities of gait and mobility: Secondary | ICD-10-CM | POA: Diagnosis not present

## 2019-11-21 NOTE — Patient Instructions (Signed)
Access Code: [email or web address] URL: [email or web address] Date: 11/21/2019 Prepared by: Mitzi Hansen Daphene Chisholm  Exercises Standing Tandem Balance with Counter Support - 1 x daily - 7 x weekly - 3 reps - 30 second holds hold Heel Toe Raises with Counter Support - 1 x daily - 7 x weekly - 2 sets - 15 reps

## 2019-11-21 NOTE — Therapy (Signed)
[place], [place], [address] Phone: [phone]   Fax:  [phone]  Physical Therapy Treatment  Patient Details  Name: [name] MRN: [id number] Date of Birth: June 02, 1945 Referring Provider (PT): Mechele Claude PA-C   Encounter Date: 11/21/2019   PT End of Session - 11/21/19 1316    Visit Number 3    Number of Visits 12    Date for PT Re-Evaluation 12/13/19    Authorization Type Primary Medicare secondary [employer] combined ST/OT/PT 60 visits 0 used no auth needed following MCR guidlines    Authorization - Visit Number 3    Authorization - Number of Visits 60    Progress Note Due on Visit 10    PT Start Time 8588    PT Stop Time 1357    PT Time Calculation (min) 40 min    Activity Tolerance Patient tolerated treatment well    Behavior During Therapy [facility] for tasks assessed/performed           Past Medical History:  Diagnosis Date  . Allergy   . Arthritis   . Cataract    BILATERAL-REMOVED  . Contact dermatitis and other eczema, due to unspecified cause   . Depression   . Encounter for long-term (current) use of other medications   . GERD (gastroesophageal reflux disease)   . Hyperlipidemia   . Hypertension   . Osteopenia   . Other and unspecified hyperlipidemia   . Pain in joint, ankle and foot   . Screening for malignant neoplasm of the cervix     Past Surgical History:  Procedure Laterality Date  . ANKLE SURGERY  05/2006   Right Ankle  . Carpel TUnnel Release  [id number]   [id number]), Left(2006)  . CATARACT EXTRACTION Left 10/03/14  . CATARACT EXTRACTION Right 11/16/2014  . COLONOSCOPY    . DIAGNOSTIC LAPAROSCOPY  1980  . FINGER SURGERY Right    ring finger  . JOINT REPLACEMENT     Total Knee(L)-11/05/07  . KNEE ARTHROPLASTY  05/2007   Left  . RHINOPLASTY  1969  . TONSILLECTOMY     @ 74 y.o.  . TOTAL ANKLE ARTHROPLASTY  10/30/2011   Procedure: TOTAL ANKLE ARTHOPLASTY;  Surgeon:  Wylene Simmer, MD;  Location: [place];  Service: Orthopedics;  Laterality: Right;  RIGHT TOTAL ANKLE REPLACEMENT, POSSIBLE GASTROC RESESSION  . TOTAL KNEE ARTHROPLASTY Right 10/30/2017   Procedure: RIGHT TOTAL KNEE ARTHROPLASTY;  Surgeon: Netta Cedars, MD;  Location: [place];  Service: Orthopedics;  Laterality: Right;  . TUBAL LIGATION  1980    There were no vitals filed for this visit.   Subjective Assessment - 11/21/19 1316    Subjective Patient states she has been doing the exercises. She has been more sore since she has been doing the exercises.    Limitations Walking;Standing;House hold activities    Currently in Pain? Yes    Pain Score 2     Pain Location Ankle    Pain Orientation Left    Pain Onset More than a month ago                             [facility] Adult PT Treatment/Exercise - 11/21/19 0001      Iontophoresis   Type of Iontophoresis Dexamethasone    Location Lt instep of foot     Dose 80MA    Time 4 hr      Manual  Therapy   Manual Therapy Joint mobilization    Manual therapy comments completed separate rest of session    Joint Mobilization talocrual AP grade III with DF overpressure    ROM improves from 3 to 9 following     Ankle Exercises: Standing   Heel Raises 15 reps   2 sets   Toe Raise 15 reps   2 sets   Other Standing Ankle Exercises tandem stance 2x 30 seconds bilateral       Ankle Exercises: Seated   Heel Raises 15 reps    Other Seated Ankle Exercises inversion isometrics 10x 5 second holds                  PT Education - 11/21/19 1316    Education Details Patient educated on HEP, exercise mechanics    Person(s) Educated Patient    Methods Explanation;Demonstration    Comprehension Verbalized understanding;Returned demonstration            PT Short Term Goals - 11/17/19 1056      PT SHORT TERM GOAL #1   Title Patient will be independent with HEP in order to improve functional outcomes.    Time 3    Period Weeks     Status On-going    Target Date 11/22/19      PT SHORT TERM GOAL #2   Title Patient will report at least 25% improvement in symptoms for improved quality of life.    Time 3    Period Weeks    Status On-going    Target Date 11/22/19      PT SHORT TERM GOAL #3   Status On-going             PT Long Term Goals - 11/17/19 1057      PT LONG TERM GOAL #1   Title Patient will report at least 75% improvement in symptoms for improved quality of life.    Time 6    Period Weeks    Status On-going      PT LONG TERM GOAL #2   Title Patient will improve FOTO score by at least 5 points in order to indicate improved tolerance to activity.    Time 6    Period Weeks    Status On-going      PT LONG TERM GOAL #3   Title Patient will be able to ambulate for at least 10 minutes with pain no greater than 1/10 in order to demonstrate improved ability to ambulate in the community.    Time 6    Period Weeks    Status On-going      PT LONG TERM GOAL #4   Status On-going      PT LONG TERM GOAL #5   Status On-going                 Plan - 11/21/19 1317    Clinical Impression Statement Patient tolerates DF mobs well with increase in dorsiflexion ROM from 3 to 9 degrees following mobilizations. Patient requires cueing for ankle ROM and limiting excessive LE movement with baps board. She demonstrates impaired motor control requiring manual assist by patient to stabilize LE. She tolerates standing heel raises without increase in symptoms. Patient initially unsteady with tandem stance which improves after about 10 seconds. She has c/o increased foot symptoms following balance exercise. Ionto applied at end of session.  Patient will continue to benefit from skilled physical therapy in order to reduce impairment and improve function.  Personal Factors and Comorbidities Age;Behavior Pattern;Past/Current Experience;Time since onset of injury/illness/exacerbation    Examination-Activity Limitations  Locomotion Level;Stairs;Squat;Stand    Examination-Participation Restrictions Yard Work;Shop;Community Activity    Stability/Clinical Decision Making Stable/Uncomplicated    Rehab Potential Good    PT Frequency 2x / week    PT Duration 6 weeks    PT Treatment/Interventions ADLs/Self Care Home Management;Aquatic Therapy;Cryotherapy;Electrical Stimulation;Iontophoresis 4mg /ml Dexamethasone;Moist Heat;Traction;Ultrasound;DME Instruction;Gait training;Stair training;Functional mobility training;Therapeutic activities;Parrafin;Contrast Bath;Fluidtherapy;Therapeutic exercise;Balance training;Patient/family education;Neuromuscular re-education;Manual techniques;Compression bandaging;Passive range of motion;Dry needling;Energy conservation;Splinting;Taping;Vasopneumatic Device;Spinal Manipulations;Joint Manipulations    PT Next Visit Plan possibly manual for pain relief/ DF mobility, balance training, begin ankle/ intrensic foot strengthing    PT Home Exercise Plan 9/21 calf stretch, foot doming; 10/7:  towel crunch, towel in/eversion, Hallicus longus stretch and single leg stance. 10/11 heel raise, toe raise, tandem stance    Consulted and Agree with Plan of Care Patient           Patient will benefit from skilled therapeutic intervention in order to improve the following deficits and impairments:  Abnormal gait, Decreased range of motion, Decreased endurance, Pain, Decreased strength, Hypomobility, Impaired flexibility, Decreased mobility, Decreased balance, Decreased activity tolerance  Visit Diagnosis: Pain in left ankle and joints of left foot  Muscle weakness (generalized)  Other abnormalities of gait and mobility  Other symptoms and signs involving the musculoskeletal system     Problem List Patient Active Problem List   Diagnosis Date Noted  . Status post total knee replacement, right 10/30/2017  . Obesity (BMI 30.0-34.9) 04/30/2017  . Map-dot-fingerprint corneal dystrophy  08/21/2014  . Vaginal dryness, menopausal 08/21/2014  . Osteopenia 12/14/2013  . Essential hypertension 12/14/2013  . Complex regional pain syndrome of lower limb 10/04/2012  . Hyperlipidemia 10/04/2012  . Allergic rhinitis 10/04/2012  . Generalized osteoarthrosis, involving multiple sites 10/04/2012     2:03 PM, 11/21/19 Mearl Latin PT, DPT Physical Therapist at [place], [place], [address] Phone: [phone]   Fax:  [phone]  Name: [name] MRN: [id number] Date of Birth: 1945-11-18

## 2019-11-24 ENCOUNTER — Ambulatory Visit (HOSPITAL_COMMUNITY): Payer: Medicare Other | Admitting: Physical Therapy

## 2019-11-24 ENCOUNTER — Other Ambulatory Visit: Payer: Self-pay

## 2019-11-24 DIAGNOSIS — R2689 Other abnormalities of gait and mobility: Secondary | ICD-10-CM | POA: Diagnosis not present

## 2019-11-24 DIAGNOSIS — M6281 Muscle weakness (generalized): Secondary | ICD-10-CM

## 2019-11-24 DIAGNOSIS — M25572 Pain in left ankle and joints of left foot: Secondary | ICD-10-CM | POA: Diagnosis not present

## 2019-11-24 DIAGNOSIS — R29898 Other symptoms and signs involving the musculoskeletal system: Secondary | ICD-10-CM | POA: Diagnosis not present

## 2019-11-24 NOTE — Therapy (Signed)
[place], [place], [address] Phone: [phone]   Fax:  [phone]  Physical Therapy Treatment  Patient Details  Name: [name] MRN: [id number] Date of Birth: [birth year] Referring Provider (PT): Mechele Claude PA-C   Encounter Date: 11/24/2019   PT End of Session - 11/24/19 1000    Visit Number 4    Number of Visits 12    Date for PT Re-Evaluation 12/13/19    Authorization Type Primary Medicare secondary [employer] combined ST/OT/PT 60 visits 0 used no auth needed following MCR guidlines    Authorization - Visit Number 4    Authorization - Number of Visits 60    Progress Note Due on Visit 10    PT Start Time [id number]    PT Stop Time 0918    PT Time Calculation (min) 45 min    Activity Tolerance Patient tolerated treatment well    Behavior During Therapy [facility] for tasks assessed/performed           Past Medical History:  Diagnosis Date   Allergy    Arthritis    Cataract    BILATERAL-REMOVED   Contact dermatitis and other eczema, due to unspecified cause    Depression    Encounter for long-term (current) use of other medications    GERD (gastroesophageal reflux disease)    Hyperlipidemia    Hypertension    Osteopenia    Other and unspecified hyperlipidemia    Pain in joint, ankle and foot    Screening for malignant neoplasm of the cervix     Past Surgical History:  Procedure Laterality Date   ANKLE SURGERY  05/2006   Right Ankle   Carpel TUnnel Release  2004,2006   Right(2004), Left(2006)   CATARACT EXTRACTION Left 10/03/14   CATARACT EXTRACTION Right 11/16/2014   COLONOSCOPY     DIAGNOSTIC LAPAROSCOPY  1980   FINGER SURGERY Right    ring finger   JOINT REPLACEMENT     Total Knee(L)-11/05/07   KNEE ARTHROPLASTY  05/2007   Left   RHINOPLASTY  1969   TONSILLECTOMY     @ 74 y.o.   TOTAL ANKLE ARTHROPLASTY  10/30/2011   Procedure: TOTAL ANKLE ARTHOPLASTY;  Surgeon:  Wylene Simmer, MD;  Location: [place];  Service: Orthopedics;  Laterality: Right;  RIGHT TOTAL ANKLE REPLACEMENT, POSSIBLE GASTROC RESESSION   TOTAL KNEE ARTHROPLASTY Right 10/30/2017   Procedure: RIGHT TOTAL KNEE ARTHROPLASTY;  Surgeon: Netta Cedars, MD;  Location: [place];  Service: Orthopedics;  Laterality: Right;   TUBAL LIGATION  1980    There were no vitals filed for this visit.   Subjective Assessment - 11/24/19 0838    Subjective pt states she has more irritation than actual pain but rates it 2/10.   sTAtes she has some soreness from the new exercises.    Currently in Pain? Yes    Pain Score 2     Pain Location Foot    Pain Orientation Left    Pain Descriptors / Indicators Sore                             [facility] Adult PT Treatment/Exercise - 11/24/19 0001      Modalities   Modalities Iontophoresis      Iontophoresis   Type of Iontophoresis Dexamethasone    Location Lt instep of foot     Dose 80MA    Time 4 hr  Manual Therapy   Manual Therapy Joint mobilization    Manual therapy comments completed separate rest of session    Joint Mobilization talocrual AP grade III with DF overpressure       Ankle Exercises: Standing   Vector Stance 5 reps;5 seconds;Right;Left    Heel Raises 15 reps   2 sets   Toe Raise 15 reps   2 sets   Other Standing Ankle Exercises tandem stance 2x 30 seconds bilateral       Ankle Exercises: Stretches   Slant Board Stretch 2 reps;30 seconds;Limitations   done individually due to tightness in Rt ankle.                   PT Short Term Goals - 11/17/19 1056      PT SHORT TERM GOAL #1   Title Patient will be independent with HEP in order to improve functional outcomes.    Time 3    Period Weeks    Status On-going    Target Date 11/22/19      PT SHORT TERM GOAL #2   Title Patient will report at least 25% improvement in symptoms for improved quality of life.    Time 3    Period Weeks    Status On-going     Target Date 11/22/19      PT SHORT TERM GOAL #3   Status On-going             PT Long Term Goals - 11/17/19 1057      PT LONG TERM GOAL #1   Title Patient will report at least 75% improvement in symptoms for improved quality of life.    Time 6    Period Weeks    Status On-going      PT LONG TERM GOAL #2   Title Patient will improve FOTO score by at least 5 points in order to indicate improved tolerance to activity.    Time 6    Period Weeks    Status On-going      PT LONG TERM GOAL #3   Title Patient will be able to ambulate for at least 10 minutes with pain no greater than 1/10 in order to demonstrate improved ability to ambulate in the community.    Time 6    Period Weeks    Status On-going      PT LONG TERM GOAL #4   Status On-going      PT LONG TERM GOAL #5   Status On-going                 Plan - 11/24/19 1009    Clinical Impression Statement Continued with established POC.  Much more difficulty with Rt LE stabilization with tandem due to ankle replacement in 2013.    Added vector to help improve stabilization bilaterally and gastroc  stretch.   Continued with manual and ionto treatment with dexamethasone (3rd visit today).  Pt improving overall.    Personal Factors and Comorbidities Age;Behavior Pattern;Past/Current Experience;Time since onset of injury/illness/exacerbation    Examination-Activity Limitations Locomotion Level;Stairs;Squat;Stand    Examination-Participation Restrictions Yard Work;Shop;Community Activity    Stability/Clinical Decision Making Stable/Uncomplicated    Rehab Potential Good    PT Frequency 2x / week    PT Duration 6 weeks    PT Treatment/Interventions ADLs/Self Care Home Management;Aquatic Therapy;Cryotherapy;Electrical Stimulation;Iontophoresis 4mg /ml Dexamethasone;Moist Heat;Traction;Ultrasound;DME Instruction;Gait training;Stair training;Functional mobility training;Therapeutic activities;Parrafin;Contrast  Bath;Fluidtherapy;Therapeutic exercise;Balance training;Patient/family education;Neuromuscular re-education;Manual techniques;Compression bandaging;Passive range of motion;Dry needling;Energy conservation;Splinting;Taping;Vasopneumatic Device;Spinal  Manipulations;Joint Manipulations    PT Next Visit Plan continue manual for pain relief/ DF mobility, balance training, ankle/ intrinsic foot strengthing    PT Home Exercise Plan 9/21 calf stretch, foot doming; 10/7:  towel crunch, towel in/eversion, Hallicus longus stretch and single leg stance. 10/11 heel raise, toe raise, tandem stance    Consulted and Agree with Plan of Care Patient           Patient will benefit from skilled therapeutic intervention in order to improve the following deficits and impairments:  Abnormal gait, Decreased range of motion, Decreased endurance, Pain, Decreased strength, Hypomobility, Impaired flexibility, Decreased mobility, Decreased balance, Decreased activity tolerance  Visit Diagnosis: No diagnosis found.     Problem List Patient Active Problem List   Diagnosis Date Noted   Status post total knee replacement, right 10/30/2017   Obesity (BMI 30.0-34.9) 04/30/2017   Map-dot-fingerprint corneal dystrophy 08/21/2014   Vaginal dryness, menopausal 08/21/2014   Osteopenia 12/14/2013   Essential hypertension 12/14/2013   Complex regional pain syndrome of lower limb 10/04/2012   Hyperlipidemia 10/04/2012   Allergic rhinitis 10/04/2012   Generalized osteoarthrosis, involving multiple sites 10/04/2012   Teena Irani, PTA/CLT [phone]  Teena Irani 11/24/2019, 10:10 AM  [place], [place], [address] Phone: [phone]   Fax:  [phone]  Name: [name] MRN: [id number] Date of Birth: February 18, 1945

## 2019-11-28 ENCOUNTER — Ambulatory Visit (HOSPITAL_COMMUNITY): Payer: Medicare Other

## 2019-11-28 ENCOUNTER — Encounter (HOSPITAL_COMMUNITY): Payer: Self-pay

## 2019-11-28 ENCOUNTER — Other Ambulatory Visit: Payer: Self-pay

## 2019-11-28 DIAGNOSIS — R29898 Other symptoms and signs involving the musculoskeletal system: Secondary | ICD-10-CM | POA: Diagnosis not present

## 2019-11-28 DIAGNOSIS — R2689 Other abnormalities of gait and mobility: Secondary | ICD-10-CM | POA: Diagnosis not present

## 2019-11-28 DIAGNOSIS — M25572 Pain in left ankle and joints of left foot: Secondary | ICD-10-CM

## 2019-11-28 DIAGNOSIS — M6281 Muscle weakness (generalized): Secondary | ICD-10-CM

## 2019-11-28 NOTE — Therapy (Signed)
[place], [place], [address] Phone: [phone]   Fax:  [phone]  Physical Therapy Treatment  Patient Details  Name: [name] MRN: [id number] Date of Birth: May 14, 1945 Referring Provider (PT): Mechele Claude PA-C   Encounter Date: 11/28/2019   PT End of Session - 11/28/19 1019    Visit Number 5    Number of Visits 12    Date for PT Re-Evaluation 12/13/19    Authorization Type Primary Medicare secondary [employer] combined ST/OT/PT 60 visits 0 used no auth needed following MCR guidlines    Authorization - Visit Number 5    Authorization - Number of Visits 60    Progress Note Due on Visit 10    PT Start Time 1024    PT Stop Time 1106    PT Time Calculation (min) 42 min    Activity Tolerance Patient tolerated treatment well    Behavior During Therapy [facility] for tasks assessed/performed           Past Medical History:  Diagnosis Date   Allergy    Arthritis    Cataract    BILATERAL-REMOVED   Contact dermatitis and other eczema, due to unspecified cause    Depression    Encounter for long-term (current) use of other medications    GERD (gastroesophageal reflux disease)    Hyperlipidemia    Hypertension    Osteopenia    Other and unspecified hyperlipidemia    Pain in joint, ankle and foot    Screening for malignant neoplasm of the cervix     Past Surgical History:  Procedure Laterality Date   ANKLE SURGERY  05/2006   Right Ankle   Carpel TUnnel Release  2004,2006   Right(2004), Left(2006)   CATARACT EXTRACTION Left 10/03/14   CATARACT EXTRACTION Right 11/16/2014   COLONOSCOPY     DIAGNOSTIC LAPAROSCOPY  1980   FINGER SURGERY Right    ring finger   JOINT REPLACEMENT     Total Knee(L)-11/05/07   KNEE ARTHROPLASTY  05/2007   Left   RHINOPLASTY  1969   TONSILLECTOMY     @ 74 y.o.   TOTAL ANKLE ARTHROPLASTY  10/30/2011   Procedure: TOTAL ANKLE ARTHOPLASTY;  Surgeon:  Wylene Simmer, MD;  Location: [place];  Service: Orthopedics;  Laterality: Right;  RIGHT TOTAL ANKLE REPLACEMENT, POSSIBLE GASTROC RESESSION   TOTAL KNEE ARTHROPLASTY Right 10/30/2017   Procedure: RIGHT TOTAL KNEE ARTHROPLASTY;  Surgeon: Netta Cedars, MD;  Location: [place];  Service: Orthopedics;  Laterality: Right;   TUBAL LIGATION  1980    There were no vitals filed for this visit.   Subjective Assessment - 11/28/19 1018    Subjective Patient states she really overdid it this weekend with over 12,000 steps Saturday. Hurting more today and not sure how she feels about doing exercise today. Planning on sitting and doing paperwork the rest of the day.    Currently in Pain? Yes    Pain Score 4     Pain Location Ankle    Pain Orientation Left;Medial    Pain Descriptors / Indicators Burning            [facility] Adult PT Treatment/Exercise - 11/28/19 0001      Modalities   Modalities Iontophoresis      Iontophoresis   Type of Iontophoresis Dexamethasone    Location Lt instep of foot     Dose 80MA    Time 4 hr  Manual Therapy   Manual Therapy Joint mobilization;Soft tissue mobilization    Manual therapy comments completed separate rest of session    Joint Mobilization talocrual AP grade III with DF overpressure     Soft tissue mobilization right posterior tibialis tendon and muscle belly      Ankle Exercises: Stretches   Slant Board Stretch 2 reps;30 seconds      Ankle Exercises: Standing   BAPS --    BAPS Limitations --    Heel Raises --      Ankle Exercises: Seated   Heel Raises 15 reps    Toe Raise 15 reps    BAPS Sitting;Level 2;10 reps           PT Education - 11/28/19 1211    Education Details Discussed purpose and technique of interventions throughout session.    Person(s) Educated Patient    Methods Explanation    Comprehension Verbalized understanding            PT Short Term Goals - 11/28/19 1216      PT SHORT TERM GOAL #1   Title Patient will be  independent with HEP in order to improve functional outcomes.    Time 3    Period Weeks    Status On-going    Target Date 11/22/19      PT SHORT TERM GOAL #2   Title Patient will report at least 25% improvement in symptoms for improved quality of life.    Time 3    Period Weeks    Status On-going    Target Date 11/22/19      PT SHORT TERM GOAL #3   Status On-going             PT Long Term Goals - 11/28/19 1217      PT LONG TERM GOAL #1   Title Patient will report at least 75% improvement in symptoms for improved quality of life.    Time 6    Period Weeks    Status On-going      PT LONG TERM GOAL #2   Title Patient will improve FOTO score by at least 5 points in order to indicate improved tolerance to activity.    Time 6    Period Weeks    Status On-going      PT LONG TERM GOAL #3   Title Patient will be able to ambulate for at least 10 minutes with pain no greater than 1/10 in order to demonstrate improved ability to ambulate in the community.    Time 6    Period Weeks    Status On-going      PT LONG TERM GOAL #4   Status On-going      PT LONG TERM GOAL #5   Status On-going            Plan - 11/28/19 1019    Clinical Impression Statement Continued with established POC. Decreased intensity of exercises today and focused on sitting exercises and manual therapy soft tissue mobilization to decrease pain. Patient reported being unaware of how tender left posterior tibialis muscle was. Moderate soft tissue adhesions noted. Continued ionto treatment with dexamethasone (4th application today). Patient reported 2/10 pain at end of session.    Personal Factors and Comorbidities Age;Behavior Pattern;Past/Current Experience;Time since onset of injury/illness/exacerbation    Examination-Activity Limitations Locomotion Level;Stairs;Squat;Stand    Examination-Participation Restrictions Yard Work;Shop;Community Activity    Stability/Clinical Decision Making  Stable/Uncomplicated    Rehab Potential Good  PT Frequency 2x / week    PT Duration 6 weeks    PT Treatment/Interventions ADLs/Self Care Home Management;Aquatic Therapy;Cryotherapy;Electrical Stimulation;Iontophoresis 4mg /ml Dexamethasone;Moist Heat;Traction;Ultrasound;DME Instruction;Gait training;Stair training;Functional mobility training;Therapeutic activities;Parrafin;Contrast Bath;Fluidtherapy;Therapeutic exercise;Balance training;Patient/family education;Neuromuscular re-education;Manual techniques;Compression bandaging;Passive range of motion;Dry needling;Energy conservation;Splinting;Taping;Vasopneumatic Device;Spinal Manipulations;Joint Manipulations    PT Next Visit Plan continue manual for pain relief/ DF mobility, balance training, ankle/ intrinsic foot strengthing    PT Home Exercise Plan 9/21 calf stretch, foot doming; 10/7:  towel crunch, towel in/eversion, Hallicus longus stretch and single leg stance. 10/11 heel raise, toe raise, tandem stance    Consulted and Agree with Plan of Care Patient           Patient will benefit from skilled therapeutic intervention in order to improve the following deficits and impairments:  Abnormal gait, Decreased range of motion, Decreased endurance, Pain, Decreased strength, Hypomobility, Impaired flexibility, Decreased mobility, Decreased balance, Decreased activity tolerance  Visit Diagnosis: Pain in left ankle and joints of left foot  Muscle weakness (generalized)  Other abnormalities of gait and mobility  Other symptoms and signs involving the musculoskeletal system     Problem List Patient Active Problem List   Diagnosis Date Noted   Status post total knee replacement, right 10/30/2017   Obesity (BMI 30.0-34.9) 04/30/2017   Map-dot-fingerprint corneal dystrophy 08/21/2014   Vaginal dryness, menopausal 08/21/2014   Osteopenia 12/14/2013   Essential hypertension 12/14/2013   Complex regional pain syndrome of lower limb  10/04/2012   Hyperlipidemia 10/04/2012   Allergic rhinitis 10/04/2012   Generalized osteoarthrosis, involving multiple sites 10/04/2012    Pamala Hurry D. Hartnett-Rands, MS, PT [facility] [place] #[id number] 11/28/2019, 12:18 PM  [place] [address] [place], [place], [address] Phone: [phone]   Fax:  [phone]  Name: [name] MRN: [id number] Date of Birth: 03-29-1945

## 2019-12-01 ENCOUNTER — Ambulatory Visit (HOSPITAL_COMMUNITY): Payer: Medicare Other

## 2019-12-01 ENCOUNTER — Other Ambulatory Visit: Payer: Self-pay

## 2019-12-01 ENCOUNTER — Encounter (HOSPITAL_COMMUNITY): Payer: Self-pay

## 2019-12-01 DIAGNOSIS — M6281 Muscle weakness (generalized): Secondary | ICD-10-CM | POA: Diagnosis not present

## 2019-12-01 DIAGNOSIS — R29898 Other symptoms and signs involving the musculoskeletal system: Secondary | ICD-10-CM | POA: Diagnosis not present

## 2019-12-01 DIAGNOSIS — R2689 Other abnormalities of gait and mobility: Secondary | ICD-10-CM

## 2019-12-01 DIAGNOSIS — M25572 Pain in left ankle and joints of left foot: Secondary | ICD-10-CM

## 2019-12-01 NOTE — Therapy (Signed)
[place], [place], [address] Phone: [phone]   Fax:  [phone]  Physical Therapy Treatment  Patient Details  Name: [name] MRN: [id number] Date of Birth: [birth year] Referring Provider (PT): Mechele Claude PA-C   Encounter Date: 12/01/2019   PT End of Session - 12/01/19 0834    Visit Number 6    Number of Visits 12    Date for PT Re-Evaluation 12/13/19    Authorization Type Primary Medicare secondary [employer] combined ST/OT/PT 60 visits 0 used no auth needed following MCR guidlines    Authorization - Visit Number 6    Authorization - Number of Visits 60    Progress Note Due on Visit 10    PT Start Time 0831    PT Stop Time 0915    PT Time Calculation (min) 44 min    Activity Tolerance Patient tolerated treatment well    Behavior During Therapy [facility] for tasks assessed/performed           Past Medical History:  Diagnosis Date  . Allergy   . Arthritis   . Cataract    BILATERAL-REMOVED  . Contact dermatitis and other eczema, due to unspecified cause   . Depression   . Encounter for long-term (current) use of other medications   . GERD (gastroesophageal reflux disease)   . Hyperlipidemia   . Hypertension   . Osteopenia   . Other and unspecified hyperlipidemia   . Pain in joint, ankle and foot   . Screening for malignant neoplasm of the cervix     Past Surgical History:  Procedure Laterality Date  . ANKLE SURGERY  05/2006   Right Ankle  . Carpel TUnnel Release  [id number]   [id number]), Left(2006)  . CATARACT EXTRACTION Left 10/03/14  . CATARACT EXTRACTION Right 11/16/2014  . COLONOSCOPY    . DIAGNOSTIC LAPAROSCOPY  1980  . FINGER SURGERY Right    ring finger  . JOINT REPLACEMENT     Total Knee(L)-11/05/07  . KNEE ARTHROPLASTY  05/2007   Left  . RHINOPLASTY  1969  . TONSILLECTOMY     @ 74 y.o.  . TOTAL ANKLE ARTHROPLASTY  10/30/2011   Procedure: TOTAL ANKLE ARTHOPLASTY;  Surgeon:  Wylene Simmer, MD;  Location: [facility];  Service: Orthopedics;  Laterality: Right;  RIGHT TOTAL ANKLE REPLACEMENT, POSSIBLE GASTROC RESESSION  . TOTAL KNEE ARTHROPLASTY Right 10/30/2017   Procedure: RIGHT TOTAL KNEE ARTHROPLASTY;  Surgeon: Netta Cedars, MD;  Location: [facility];  Service: Orthopedics;  Laterality: Right;  . TUBAL LIGATION  1980    There were no vitals filed for this visit.   Subjective Assessment - 12/01/19 0832    Subjective Reports foot is feeling a little better than Monday.  Continues to have constant sore/achey pain though does have some burning.    Patient Stated Goals decrease symptoms    Currently in Pain? Yes    Pain Score 1     Pain Location Ankle    Pain Orientation Left;Medial    Pain Descriptors / Indicators Aching;Sore;Burning    Pain Type Chronic pain    Pain Onset More than a month ago    Pain Frequency Constant    Aggravating Factors  walking    Pain Relieving Factors rest    Effect of Pain on Daily Activities powers through              [facility] PT Assessment - 12/01/19 0001      Assessment  Medical Diagnosis Posterior Tibialis Tendonitis    Referring Provider (PT) Mechele Claude PA-C    Onset Date/Surgical Date 05/18/19    Next MD Visit in 6 weeks if she needs it    Prior Therapy yes      Precautions   Precautions None                         [facility] Adult PT Treatment/Exercise - 12/01/19 0001      Modalities   Modalities Iontophoresis      Iontophoresis   Type of Iontophoresis Dexamethasone    Location Lt instep of foot     Dose 80MA    Time 4 hr      Manual Therapy   Manual Therapy Soft tissue mobilization    Manual therapy comments completed separate rest of session    Soft tissue mobilization right posterior tibialis tendon and muscle belly      Ankle Exercises: Standing   BAPS --   attempted standing, reports increased pain so switched to si   SLS --    Heel Raises 15 reps   incline slope   Toe Raise 15 reps    incline slope   Other Standing Ankle Exercises arch formation with and without toe extension    Other Standing Ankle Exercises tandem stance on foam 2x 30 seconds bilateral       Ankle Exercises: Stretches   Slant Board Stretch 2 reps;30 seconds    Other Stretch 12in step knee drive for DF 5x 10"      Ankle Exercises: Seated   BAPS Sitting;Level 3;10 reps                    PT Short Term Goals - 11/28/19 1216      PT SHORT TERM GOAL #1   Title Patient will be independent with HEP in order to improve functional outcomes.    Time 3    Period Weeks    Status On-going    Target Date 11/22/19      PT SHORT TERM GOAL #2   Title Patient will report at least 25% improvement in symptoms for improved quality of life.    Time 3    Period Weeks    Status On-going    Target Date 11/22/19      PT SHORT TERM GOAL #3   Status On-going             PT Long Term Goals - 11/28/19 1217      PT LONG TERM GOAL #1   Title Patient will report at least 75% improvement in symptoms for improved quality of life.    Time 6    Period Weeks    Status On-going      PT LONG TERM GOAL #2   Title Patient will improve FOTO score by at least 5 points in order to indicate improved tolerance to activity.    Time 6    Period Weeks    Status On-going      PT LONG TERM GOAL #3   Title Patient will be able to ambulate for at least 10 minutes with pain no greater than 1/10 in order to demonstrate improved ability to ambulate in the community.    Time 6    Period Weeks    Status On-going      PT LONG TERM GOAL #4   Status On-going      PT LONG  TERM GOAL #5   Status On-going                 Plan - 12/01/19 1255    Clinical Impression Statement Resumed standing exercises with good tolerance, still limited by some pain with weight bearing activities.  Added arch formation strengthening exercises and educated pt on importance of arch to reduce strain on musculature.  STM complete  with tenderness to posterior tibialis musculture, continues to have soft tissue adhesions noted.  EOS with ionto treatment with dexamethaxone (5th applicatoin today).  EOS pt reports pain scale low.    Personal Factors and Comorbidities Age;Behavior Pattern;Past/Current Experience;Time since onset of injury/illness/exacerbation    Examination-Activity Limitations Locomotion Level;Stairs;Squat;Stand    Examination-Participation Restrictions Yard Work;Shop;Community Activity    Stability/Clinical Decision Making Stable/Uncomplicated    Clinical Decision Making Low    Rehab Potential Good    PT Frequency 2x / week    PT Duration 6 weeks    PT Treatment/Interventions ADLs/Self Care Home Management;Aquatic Therapy;Cryotherapy;Electrical Stimulation;Iontophoresis 4mg /ml Dexamethasone;Moist Heat;Traction;Ultrasound;DME Instruction;Gait training;Stair training;Functional mobility training;Therapeutic activities;Parrafin;Contrast Bath;Fluidtherapy;Therapeutic exercise;Balance training;Patient/family education;Neuromuscular re-education;Manual techniques;Compression bandaging;Passive range of motion;Dry needling;Energy conservation;Splinting;Taping;Vasopneumatic Device;Spinal Manipulations;Joint Manipulations    PT Next Visit Plan 6/6 ionto treatment next session.  continue manual for pain relief/ DF mobility, balance training, ankle/ intrinsic foot strengthing    PT Home Exercise Plan 9/21 calf stretch, foot doming; 10/7:  towel crunch, towel in/eversion, Hallicus longus stretch and single leg stance. 10/11 heel raise, toe raise, tandem stance           Patient will benefit from skilled therapeutic intervention in order to improve the following deficits and impairments:  Abnormal gait, Decreased range of motion, Decreased endurance, Pain, Decreased strength, Hypomobility, Impaired flexibility, Decreased mobility, Decreased balance, Decreased activity tolerance  Visit Diagnosis: Pain in left ankle and  joints of left foot  Muscle weakness (generalized)  Other abnormalities of gait and mobility  Other symptoms and signs involving the musculoskeletal system     Problem List Patient Active Problem List   Diagnosis Date Noted  . Status post total knee replacement, right 10/30/2017  . Obesity (BMI 30.0-34.9) 04/30/2017  . 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 Vaginal dryness, menopausal 08/21/2014  . Osteopenia 12/14/2013  . Essential hypertension 12/14/2013  . Complex regional pain syndrome of lower limb 10/04/2012  . Hyperlipidemia 10/04/2012  . Allergic rhinitis 10/04/2012  . Generalized osteoarthrosis, involving multiple sites 10/04/2012   Ihor Austin, LPTA/CLT; CBIS [phone]  Aldona Lento 12/01/2019, 1:01 PM  [place] [address] [place], [place], [address] Phone: [phone]   Fax:  [phone]  Name: Cleona Doubleday MRN: [id number] Date of Birth: 17-Jun-1945

## 2019-12-05 ENCOUNTER — Ambulatory Visit (HOSPITAL_COMMUNITY): Payer: Medicare Other

## 2019-12-05 ENCOUNTER — Other Ambulatory Visit: Payer: Self-pay

## 2019-12-05 DIAGNOSIS — M25572 Pain in left ankle and joints of left foot: Secondary | ICD-10-CM

## 2019-12-05 DIAGNOSIS — R29898 Other symptoms and signs involving the musculoskeletal system: Secondary | ICD-10-CM

## 2019-12-05 DIAGNOSIS — R2689 Other abnormalities of gait and mobility: Secondary | ICD-10-CM | POA: Diagnosis not present

## 2019-12-05 DIAGNOSIS — M6281 Muscle weakness (generalized): Secondary | ICD-10-CM

## 2019-12-05 NOTE — Therapy (Signed)
[place], [place], [address] Phone: [phone]   Fax:  [phone]  Physical Therapy Treatment  Patient Details  Name: Margalit Leece MRN: [id number] Date of Birth: Jan 08, 1946 Referring Provider (PT): Mechele Claude PA-C   Encounter Date: 12/05/2019   PT End of Session - 12/05/19 1022    Visit Number 7    Number of Visits 12    Date for PT Re-Evaluation 12/13/19    Authorization Type Primary Medicare secondary [employer] combined ST/OT/PT 60 visits 0 used no auth needed following MCR guidlines    Authorization - Visit Number 7    Authorization - Number of Visits 60    Progress Note Due on Visit 10    PT Start Time 1022    PT Stop Time 1103    PT Time Calculation (min) 41 min    Activity Tolerance Patient tolerated treatment well    Behavior During Therapy [facility] for tasks assessed/performed           Past Medical History:  Diagnosis Date  . Allergy   . Arthritis   . Cataract    BILATERAL-REMOVED  . Contact dermatitis and other eczema, due to unspecified cause   . Depression   . Encounter for long-term (current) use of other medications   . GERD (gastroesophageal reflux disease)   . Hyperlipidemia   . Hypertension   . Osteopenia   . Other and unspecified hyperlipidemia   . Pain in joint, ankle and foot   . Screening for malignant neoplasm of the cervix     Past Surgical History:  Procedure Laterality Date  . ANKLE SURGERY  05/2006   Right Ankle  . Carpel TUnnel Release  [id number]   [id number]), Left(2006)  . CATARACT EXTRACTION Left 10/03/14  . CATARACT EXTRACTION Right 11/16/2014  . COLONOSCOPY    . DIAGNOSTIC LAPAROSCOPY  1980  . FINGER SURGERY Right    ring finger  . JOINT REPLACEMENT     Total Knee(L)-11/05/07  . KNEE ARTHROPLASTY  05/2007   Left  . RHINOPLASTY  1969  . TONSILLECTOMY     @ 74 y.o.  . TOTAL ANKLE ARTHROPLASTY  10/30/2011   Procedure: TOTAL ANKLE ARTHOPLASTY;  Surgeon:  Wylene Simmer, MD;  Location: [facility];  Service: Orthopedics;  Laterality: Right;  RIGHT TOTAL ANKLE REPLACEMENT, POSSIBLE GASTROC RESESSION  . TOTAL KNEE ARTHROPLASTY Right 10/30/2017   Procedure: RIGHT TOTAL KNEE ARTHROPLASTY;  Surgeon: Netta Cedars, MD;  Location: [facility];  Service: Orthopedics;  Laterality: Right;  . TUBAL LIGATION  1980    There were no vitals filed for this visit.   Subjective Assessment - 12/05/19 1022    Subjective Patient reports 1/10 pain today following her usual pattern of overdoing it to the point of more pain with it taking about a week to recover. 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    Patient Stated Goals decrease symptoms    Pain Location Ankle    Pain Orientation Left;Anterior    Pain Descriptors / Indicators Burning    Pain Onset More than a month ago             [facility] Adult PT Treatment/Exercise - 12/05/19 0001      Iontophoresis   Type of Iontophoresis Dexamethasone    Location left middle cuneiform/1st metatarsal joint    Dose 80MA    Time 4 hr  Manual Therapy   Manual Therapy Soft tissue mobilization    Manual therapy comments completed separate rest of session    Soft tissue mobilization right posterior tibialis muscle belly      Ankle Exercises: Stretches   Slant Board Stretch 2 reps;30 seconds    Other Stretch 12in step knee drive for DF 5x 10" bilateral      Ankle Exercises: Standing   BAPS Standing;Level 1;10 reps    SLS 30 sec x1; no UE use on right; intermittent UE use on left    Heel Raises 15 reps   incline slope   Toe Raise 15 reps   incline slope   Other Standing Ankle Exercises arch formation with and without toe extension x10 2-3 sec hold    Other Standing Ankle Exercises tandem stance on foam 2x 30 seconds bilateral              PT Education - 12/05/19 1159    Education Details Discussed purpose and technique of interventions throughout session.    Person(s)  Educated Patient    Methods Explanation    Comprehension Verbalized understanding            PT Short Term Goals - 12/05/19 1209      PT SHORT TERM GOAL #1   Title Patient will be independent with HEP in order to improve functional outcomes.    Time 3    Period Weeks    Status Achieved    Target Date 11/22/19      PT SHORT TERM GOAL #2   Title Patient will report at least 25% improvement in symptoms for improved quality of life.    Time 3    Period Weeks    Status On-going    Target Date 11/22/19      PT SHORT TERM GOAL #3   Status On-going             PT Long Term Goals - 12/05/19 1210      PT LONG TERM GOAL #1   Title Patient will report at least 75% improvement in symptoms for improved quality of life.    Time 6    Period Weeks    Status On-going      PT LONG TERM GOAL #2   Title Patient will improve FOTO score by at least 5 points in order to indicate improved tolerance to activity.    Time 6    Period Weeks    Status On-going      PT LONG TERM GOAL #3   Title Patient will be able to ambulate for at least 10 minutes with pain no greater than 1/10 in order to demonstrate improved ability to ambulate in the community.    Time 6    Period Weeks    Status On-going      PT LONG TERM GOAL #4   Status On-going      PT LONG TERM GOAL #5   Status On-going           Plan - 12/05/19 1023    Clinical Impression Statement Advanced to more weight bearing/standing exercises with good tolerance, with increased complaints of foot, ankle and legs fatigue. Added SLS bilaterally. BAPS Level 1 standing on left with most difficulty with counterclockwise in lower right quadrant. Tender to palpation over the joint between the middle cuneiform and 1st metatarsal along the tendon of the tibialis anterior tendon at end of session. This is where the iontophoresis was placed today.  Patient reported no pain at end of session.    Personal Factors and Comorbidities Age;Behavior  Pattern;Past/Current Experience;Time since onset of injury/illness/exacerbation    Examination-Activity Limitations Locomotion Level;Stairs;Squat;Stand    Examination-Participation Restrictions Yard Work;Shop;Community Activity    Stability/Clinical Decision Making Stable/Uncomplicated    Rehab Potential Good    PT Frequency 2x / week    PT Duration 6 weeks    PT Treatment/Interventions ADLs/Self Care Home Management;Aquatic Therapy;Cryotherapy;Electrical Stimulation;Iontophoresis 4mg /ml Dexamethasone;Moist Heat;Traction;Ultrasound;DME Instruction;Gait training;Stair training;Functional mobility training;Therapeutic activities;Parrafin;Contrast Bath;Fluidtherapy;Therapeutic exercise;Balance training;Patient/family education;Neuromuscular re-education;Manual techniques;Compression bandaging;Passive range of motion;Dry needling;Energy conservation;Splinting;Taping;Vasopneumatic Device;Spinal Manipulations;Joint Manipulations    PT Next Visit Plan 6/6 ionto treatment next session.  continue manual for pain relief/ DF mobility, balance training, ankle/ intrinsic foot strengthing    PT Home Exercise Plan 9/21 calf stretch, foot doming; 10/7:  towel crunch, towel in/eversion, Hallicus longus stretch and single leg stance. 10/11 heel raise, toe raise, tandem stance           Patient will benefit from skilled therapeutic intervention in order to improve the following deficits and impairments:  Abnormal gait, Decreased range of motion, Decreased endurance, Pain, Decreased strength, Hypomobility, Impaired flexibility, Decreased mobility, Decreased balance, Decreased activity tolerance  Visit Diagnosis: Pain in left ankle and joints of left foot  Muscle weakness (generalized)  Other abnormalities of gait and mobility  Other symptoms and signs involving the musculoskeletal system     Problem List Patient Active Problem List   Diagnosis Date Noted  . Status post total knee replacement, right  10/30/2017  . Obesity (BMI 30.0-34.9) 04/30/2017  . 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 Vaginal dryness, menopausal 08/21/2014  . Osteopenia 12/14/2013  . Essential hypertension 12/14/2013  . Complex regional pain syndrome of lower limb 10/04/2012  . Hyperlipidemia 10/04/2012  . Allergic rhinitis 10/04/2012  . Generalized osteoarthrosis, involving multiple sites 10/04/2012    Floria Raveling. Hartnett-Rands, MS, PT [facility] #[id number] 12/05/2019, 12:12 PM  [facility] [address] [place], [place], [address] Phone: [phone]   Fax:  [phone]  Name: Dejana Pugsley MRN: [id number] Date of Birth: 1945/03/05

## 2019-12-07 ENCOUNTER — Encounter (HOSPITAL_COMMUNITY): Payer: Self-pay | Admitting: Physical Therapy

## 2019-12-07 ENCOUNTER — Other Ambulatory Visit: Payer: Self-pay

## 2019-12-07 ENCOUNTER — Ambulatory Visit (HOSPITAL_COMMUNITY): Payer: Medicare Other | Admitting: Physical Therapy

## 2019-12-07 DIAGNOSIS — R2689 Other abnormalities of gait and mobility: Secondary | ICD-10-CM

## 2019-12-07 DIAGNOSIS — M6281 Muscle weakness (generalized): Secondary | ICD-10-CM | POA: Diagnosis not present

## 2019-12-07 DIAGNOSIS — M25572 Pain in left ankle and joints of left foot: Secondary | ICD-10-CM | POA: Diagnosis not present

## 2019-12-07 DIAGNOSIS — R29898 Other symptoms and signs involving the musculoskeletal system: Secondary | ICD-10-CM | POA: Diagnosis not present

## 2019-12-07 NOTE — Therapy (Signed)
[facility] [address] [place], [place], [address] Phone: [phone]   Fax:  [phone]  Physical Therapy Treatment/Discharge Summary  Patient Details  Name: [name] MRN: [id number] Date of Birth: 23-Mar-1945 Referring Provider (PT): Mechele Claude PA-C   Encounter Date: 12/07/2019  PHYSICAL THERAPY DISCHARGE SUMMARY  Visits from Start of Care: 8  Current functional level related to goals / functional outcomes: See below   Remaining deficits: See below   Education / Equipment: See below  Plan: Patient agrees to discharge.  Patient goals were partially met. Patient is being discharged due to lack of progress.  ?????        PT End of Session - 12/07/19 1001    Visit Number 8    Number of Visits 12    Date for PT Re-Evaluation 12/13/19    Authorization Type Primary Medicare secondary GEHA combined ST/OT/PT 60 visits 0 used no auth needed following MCR guidlines    Authorization - Visit Number 8    Authorization - Number of Visits 60    Progress Note Due on Visit 10    PT Start Time 1002    PT Stop Time 1030    PT Time Calculation (min) 28 min    Activity Tolerance Patient tolerated treatment well    Behavior During Therapy WFL for tasks assessed/performed           Past Medical History:  Diagnosis Date  . Allergy   . Arthritis   . Cataract    BILATERAL-REMOVED  . Contact dermatitis and other eczema, due to unspecified cause   . Depression   . Encounter for long-term (current) use of other medications   . GERD (gastroesophageal reflux disease)   . Hyperlipidemia   . Hypertension   . Osteopenia   . Other and unspecified hyperlipidemia   . Pain in joint, ankle and foot   . Screening for malignant neoplasm of the cervix     Past Surgical History:  Procedure Laterality Date  . ANKLE SURGERY  05/2006   Right Ankle  . Carpel TUnnel Release  [id number]   [id number]), Left(2006)  . CATARACT  EXTRACTION Left 10/03/14  . CATARACT EXTRACTION Right 11/16/2014  . COLONOSCOPY    . DIAGNOSTIC LAPAROSCOPY  1980  . FINGER SURGERY Right    ring finger  . JOINT REPLACEMENT     Total Knee(L)-11/05/07  . KNEE ARTHROPLASTY  05/2007   Left  . RHINOPLASTY  1969  . TONSILLECTOMY     @ 74 y.o.  . TOTAL ANKLE ARTHROPLASTY  10/30/2011   Procedure: TOTAL ANKLE ARTHOPLASTY;  Surgeon: Wylene Simmer, MD;  Location: [facility];  Service: Orthopedics;  Laterality: Right;  RIGHT TOTAL ANKLE REPLACEMENT, POSSIBLE GASTROC RESESSION  . TOTAL KNEE ARTHROPLASTY Right 10/30/2017   Procedure: RIGHT TOTAL KNEE ARTHROPLASTY;  Surgeon: Netta Cedars, MD;  Location: [facility];  Service: Orthopedics;  Laterality: Right;  . TUBAL LIGATION  1980    There were no vitals filed for this visit.   Subjective Assessment - 12/07/19 1003    Subjective Patient states overall her foot is about the same. Her foot is not awfully painful. She notes burning pain after taking her shoe off. She continues to feel very active. She wants to follow up with MD and see what else can be done.    Patient Stated Goals decrease symptoms    Currently in Pain? Yes    Pain Score 1     Pain  Location Ankle    Pain Orientation Left    Pain Descriptors / Indicators Burning    Pain Onset More than a month ago              [facility] PT Assessment - 12/07/19 0001      Assessment   Medical Diagnosis Posterior Tibialis Tendonitis    Referring Provider (PT) Mechele Claude PA-C    Onset Date/Surgical Date 05/18/19    Next MD Visit in 6 weeks if she needs it    Prior Therapy yes      Precautions   Precautions None      Restrictions   Weight Bearing Restrictions No      Balance Screen   Has the patient fallen in the past 6 months Yes    How many times? 1    Has the patient had a decrease in activity level because of a fear of falling?  No    Is the patient reluctant to leave their home because of a fear of falling?  No      Prior Function   Level of  Independence Independent    Vocation Retired      Charity fundraiser Status Within Functional Limits for tasks assessed      Observation/Other Assessments   Observations Ambulates without AD    Focus on Therapeutic Outcomes (FOTO)  22% limited      AROM   Left Ankle Dorsiflexion 9    Left Ankle Plantar Flexion 48    Left Ankle Inversion 30    Left Ankle Eversion 8      Strength   Left Ankle Dorsiflexion 5/5    Left Ankle Inversion 5/5    Left Ankle Eversion 5/5                                 PT Education - 12/07/19 1001    Education Details Patient educated on HEP, exercise mechanics, footwear, orthotics, continuing HEP, returning to PT if needed    Person(s) Educated Patient    Methods Explanation;Demonstration    Comprehension Verbalized understanding;Returned demonstration            PT Short Term Goals - 12/07/19 1022      PT SHORT TERM GOAL #1   Title Patient will be independent with HEP in order to improve functional outcomes.    Time 3    Period Weeks    Status Achieved    Target Date 11/22/19      PT SHORT TERM GOAL #2   Title Patient will report at least 25% improvement in symptoms for improved quality of life.    Baseline 10/27 50% improvement    Time 3    Period Weeks    Status Achieved    Target Date 11/22/19      PT SHORT TERM GOAL #3   Status On-going             PT Long Term Goals - 12/07/19 1023      PT LONG TERM GOAL #1   Title Patient will report at least 75% improvement in symptoms for improved quality of life.    Time 6    Period Weeks    Status Not Met      PT LONG TERM GOAL #2   Title Patient will improve FOTO score by at least 5 points in order to indicate improved tolerance to  activity.    Time 6    Period Weeks    Status Not Met      PT LONG TERM GOAL #3   Title Patient will be able to ambulate for at least 10 minutes with pain no greater than 1/10 in order to demonstrate improved  ability to ambulate in the community.    Baseline 2/10 pain with walking    Time 6    Period Weeks    Status Not Met      PT LONG TERM GOAL #4   Status On-going      PT LONG TERM GOAL #5   Status On-going                 Plan - 12/07/19 1002    Clinical Impression Statement Patient has met 2/2 short term goals and 0/3 long term goals with ability to complete HEP and improved symptoms/functioning. Patient remaining goals not met due to impaired activity tolerance and gait and continued symptoms. Patient showing improving strength and ROM in L ankle but continues to be limited by pain at posterior tibialis insertion, tendon and muscle belly. Ionto also did not change symptoms. Patient continues to be very active but notes pain during and following activity. Patient lack of progress likely due to very active lifestyle continuing to increase symptoms. Reviewed HEP with patient today and educated her on progress made. Patient discharged from physical therapy at this time.    Personal Factors and Comorbidities Age;Behavior Pattern;Past/Current Experience;Time since onset of injury/illness/exacerbation    Examination-Activity Limitations Locomotion Level;Stairs;Squat;Stand    Examination-Participation Restrictions Yard Work;Shop;Community Activity    Stability/Clinical Decision Making Stable/Uncomplicated    Rehab Potential Good    PT Frequency --    PT Duration --    PT Treatment/Interventions ADLs/Self Care Home Management;Aquatic Therapy;Cryotherapy;Electrical Stimulation;Iontophoresis 21m/ml Dexamethasone;Moist Heat;Traction;Ultrasound;DME Instruction;Gait training;Stair training;Functional mobility training;Therapeutic activities;Parrafin;Contrast Bath;Fluidtherapy;Therapeutic exercise;Balance training;Patient/family education;Neuromuscular re-education;Manual techniques;Compression bandaging;Passive range of motion;Dry needling;Energy conservation;Splinting;Taping;Vasopneumatic  Device;Spinal Manipulations;Joint Manipulations    PT Next Visit Plan n/a    PT Home Exercise Plan 9/21 calf stretch, foot doming; 10/7:  towel crunch, towel in/eversion, Hallicus longus stretch and single leg stance. 10/11 heel raise, toe raise, tandem stance           Patient will benefit from skilled therapeutic intervention in order to improve the following deficits and impairments:  Abnormal gait, Decreased range of motion, Decreased endurance, Pain, Decreased strength, Hypomobility, Impaired flexibility, Decreased mobility, Decreased balance, Decreased activity tolerance  Visit Diagnosis: Pain in left ankle and joints of left foot  Muscle weakness (generalized)  Other abnormalities of gait and mobility  Other symptoms and signs involving the musculoskeletal system     Problem List Patient Active Problem List   Diagnosis Date Noted  . Status post total knee replacement, right 10/30/2017  . Obesity (BMI 30.0-34.9) 04/30/2017  . 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 Vaginal dryness, menopausal 08/21/2014  . Osteopenia 12/14/2013  . Essential hypertension 12/14/2013  . Complex regional pain syndrome of lower limb 10/04/2012  . Hyperlipidemia 10/04/2012  . Allergic rhinitis 10/04/2012  . Generalized osteoarthrosis, involving multiple sites 10/04/2012    10:41 AM, 12/07/19 AMearl LatinPT, DPT [facility] [address]Phone: [phone]  Fax:  [phone] Name: EJoshlyn BeadleMRN: [id number] of Birth: [birth year]

## 2020-01-13 DIAGNOSIS — Z23 Encounter for immunization: Secondary | ICD-10-CM | POA: Diagnosis not present

## 2020-02-09 DIAGNOSIS — J019 Acute sinusitis, unspecified: Secondary | ICD-10-CM | POA: Diagnosis not present

## 2020-02-09 DIAGNOSIS — I10 Essential (primary) hypertension: Secondary | ICD-10-CM | POA: Diagnosis not present

## 2020-02-09 DIAGNOSIS — E785 Hyperlipidemia, unspecified: Secondary | ICD-10-CM | POA: Diagnosis not present

## 2020-02-09 DIAGNOSIS — F329 Major depressive disorder, single episode, unspecified: Secondary | ICD-10-CM | POA: Diagnosis not present

## 2020-02-09 DIAGNOSIS — Z20822 Contact with and (suspected) exposure to covid-19: Secondary | ICD-10-CM | POA: Diagnosis not present

## 2020-03-22 DIAGNOSIS — J3081 Allergic rhinitis due to animal (cat) (dog) hair and dander: Secondary | ICD-10-CM | POA: Diagnosis not present

## 2020-03-22 DIAGNOSIS — J301 Allergic rhinitis due to pollen: Secondary | ICD-10-CM | POA: Diagnosis not present

## 2020-03-22 DIAGNOSIS — J3089 Other allergic rhinitis: Secondary | ICD-10-CM | POA: Diagnosis not present

## 2020-05-24 DIAGNOSIS — Z0001 Encounter for general adult medical examination with abnormal findings: Secondary | ICD-10-CM | POA: Diagnosis not present

## 2020-05-24 DIAGNOSIS — E785 Hyperlipidemia, unspecified: Secondary | ICD-10-CM | POA: Diagnosis not present

## 2020-05-24 DIAGNOSIS — I10 Essential (primary) hypertension: Secondary | ICD-10-CM | POA: Diagnosis not present

## 2020-05-28 DIAGNOSIS — Z85828 Personal history of other malignant neoplasm of skin: Secondary | ICD-10-CM | POA: Diagnosis not present

## 2020-05-28 DIAGNOSIS — L821 Other seborrheic keratosis: Secondary | ICD-10-CM | POA: Diagnosis not present

## 2020-05-28 DIAGNOSIS — D2261 Melanocytic nevi of right upper limb, including shoulder: Secondary | ICD-10-CM | POA: Diagnosis not present

## 2020-05-28 DIAGNOSIS — L814 Other melanin hyperpigmentation: Secondary | ICD-10-CM | POA: Diagnosis not present

## 2020-05-28 DIAGNOSIS — D225 Melanocytic nevi of trunk: Secondary | ICD-10-CM | POA: Diagnosis not present

## 2020-05-28 DIAGNOSIS — I788 Other diseases of capillaries: Secondary | ICD-10-CM | POA: Diagnosis not present

## 2020-05-28 DIAGNOSIS — D1801 Hemangioma of skin and subcutaneous tissue: Secondary | ICD-10-CM | POA: Diagnosis not present

## 2020-05-29 DIAGNOSIS — E785 Hyperlipidemia, unspecified: Secondary | ICD-10-CM | POA: Diagnosis not present

## 2020-05-29 DIAGNOSIS — I10 Essential (primary) hypertension: Secondary | ICD-10-CM | POA: Diagnosis not present

## 2020-05-29 DIAGNOSIS — G4701 Insomnia due to medical condition: Secondary | ICD-10-CM | POA: Diagnosis not present

## 2020-05-29 DIAGNOSIS — J302 Other seasonal allergic rhinitis: Secondary | ICD-10-CM | POA: Diagnosis not present

## 2020-06-14 DIAGNOSIS — I10 Essential (primary) hypertension: Secondary | ICD-10-CM | POA: Diagnosis not present

## 2020-07-30 DIAGNOSIS — Z961 Presence of intraocular lens: Secondary | ICD-10-CM | POA: Diagnosis not present

## 2020-07-30 DIAGNOSIS — H26493 Other secondary cataract, bilateral: Secondary | ICD-10-CM | POA: Diagnosis not present

## 2020-09-19 ENCOUNTER — Other Ambulatory Visit: Payer: Self-pay | Admitting: Internal Medicine

## 2020-09-19 DIAGNOSIS — Z1231 Encounter for screening mammogram for malignant neoplasm of breast: Secondary | ICD-10-CM

## 2020-10-03 DIAGNOSIS — H903 Sensorineural hearing loss, bilateral: Secondary | ICD-10-CM | POA: Diagnosis not present

## 2020-10-10 DIAGNOSIS — M25512 Pain in left shoulder: Secondary | ICD-10-CM | POA: Diagnosis not present

## 2020-10-10 DIAGNOSIS — Z96652 Presence of left artificial knee joint: Secondary | ICD-10-CM | POA: Diagnosis not present

## 2020-10-10 DIAGNOSIS — M25562 Pain in left knee: Secondary | ICD-10-CM | POA: Diagnosis not present

## 2020-10-16 ENCOUNTER — Other Ambulatory Visit: Payer: Self-pay

## 2020-10-16 ENCOUNTER — Ambulatory Visit
Admission: RE | Admit: 2020-10-16 | Discharge: 2020-10-16 | Disposition: A | Discharge status: Home / Self Care | Payer: Medicare Other | Source: Ambulatory Visit | Attending: Internal Medicine | Admitting: Internal Medicine

## 2020-10-16 DIAGNOSIS — Z1231 Encounter for screening mammogram for malignant neoplasm of breast: Secondary | ICD-10-CM | POA: Diagnosis not present

## 2020-10-19 DIAGNOSIS — Z23 Encounter for immunization: Secondary | ICD-10-CM | POA: Diagnosis not present

## 2020-10-23 DIAGNOSIS — Z0001 Encounter for general adult medical examination with abnormal findings: Secondary | ICD-10-CM | POA: Diagnosis not present

## 2020-10-23 DIAGNOSIS — I10 Essential (primary) hypertension: Secondary | ICD-10-CM | POA: Diagnosis not present

## 2020-10-23 DIAGNOSIS — Z0000 Encounter for general adult medical examination without abnormal findings: Secondary | ICD-10-CM | POA: Diagnosis not present

## 2020-10-23 DIAGNOSIS — E785 Hyperlipidemia, unspecified: Secondary | ICD-10-CM | POA: Diagnosis not present

## 2020-10-23 DIAGNOSIS — E559 Vitamin D deficiency, unspecified: Secondary | ICD-10-CM | POA: Diagnosis not present

## 2020-10-26 ENCOUNTER — Other Ambulatory Visit: Payer: Self-pay | Admitting: Internal Medicine

## 2020-10-26 ENCOUNTER — Ambulatory Visit
Admission: RE | Admit: 2020-10-26 | Discharge: 2020-10-26 | Disposition: A | Discharge status: Home / Self Care | Payer: Medicare Other | Source: Ambulatory Visit | Attending: Internal Medicine | Admitting: Internal Medicine

## 2020-10-26 ENCOUNTER — Other Ambulatory Visit: Payer: Self-pay

## 2020-10-26 DIAGNOSIS — R0789 Other chest pain: Secondary | ICD-10-CM | POA: Diagnosis not present

## 2020-10-26 DIAGNOSIS — R252 Cramp and spasm: Secondary | ICD-10-CM | POA: Diagnosis not present

## 2020-10-26 DIAGNOSIS — Z0001 Encounter for general adult medical examination with abnormal findings: Secondary | ICD-10-CM | POA: Diagnosis not present

## 2020-10-26 DIAGNOSIS — H5310 Unspecified subjective visual disturbances: Secondary | ICD-10-CM | POA: Diagnosis not present

## 2020-10-26 DIAGNOSIS — M1990 Unspecified osteoarthritis, unspecified site: Secondary | ICD-10-CM | POA: Diagnosis not present

## 2020-10-26 DIAGNOSIS — E785 Hyperlipidemia, unspecified: Secondary | ICD-10-CM | POA: Diagnosis not present

## 2020-10-26 DIAGNOSIS — I10 Essential (primary) hypertension: Secondary | ICD-10-CM | POA: Diagnosis not present

## 2020-10-26 DIAGNOSIS — R079 Chest pain, unspecified: Secondary | ICD-10-CM | POA: Diagnosis not present

## 2020-10-26 DIAGNOSIS — Z13820 Encounter for screening for osteoporosis: Secondary | ICD-10-CM | POA: Diagnosis not present

## 2020-10-26 DIAGNOSIS — Z23 Encounter for immunization: Secondary | ICD-10-CM | POA: Diagnosis not present

## 2020-10-26 DIAGNOSIS — Z1211 Encounter for screening for malignant neoplasm of colon: Secondary | ICD-10-CM | POA: Diagnosis not present

## 2020-10-26 DIAGNOSIS — Z1231 Encounter for screening mammogram for malignant neoplasm of breast: Secondary | ICD-10-CM | POA: Diagnosis not present

## 2020-10-26 DIAGNOSIS — M810 Age-related osteoporosis without current pathological fracture: Secondary | ICD-10-CM

## 2020-10-26 DIAGNOSIS — G4701 Insomnia due to medical condition: Secondary | ICD-10-CM | POA: Diagnosis not present

## 2020-11-02 DIAGNOSIS — U071 COVID-19: Secondary | ICD-10-CM | POA: Diagnosis not present

## 2020-11-02 DIAGNOSIS — R059 Cough, unspecified: Secondary | ICD-10-CM | POA: Diagnosis not present

## 2020-11-05 ENCOUNTER — Ambulatory Visit: Payer: Medicare Other | Admitting: Cardiology

## 2020-11-05 ENCOUNTER — Other Ambulatory Visit: Payer: Self-pay

## 2020-11-05 ENCOUNTER — Encounter: Payer: Self-pay | Admitting: Cardiology

## 2020-11-05 VITALS — BP 187/104 | HR 67 | Temp 98.6°F | Resp 16 | Ht 60.0 in | Wt 137.0 lb

## 2020-11-05 DIAGNOSIS — K219 Gastro-esophageal reflux disease without esophagitis: Secondary | ICD-10-CM

## 2020-11-05 DIAGNOSIS — I10 Essential (primary) hypertension: Secondary | ICD-10-CM | POA: Diagnosis not present

## 2020-11-05 DIAGNOSIS — E7800 Pure hypercholesterolemia, unspecified: Secondary | ICD-10-CM

## 2020-11-05 DIAGNOSIS — R079 Chest pain, unspecified: Secondary | ICD-10-CM | POA: Diagnosis not present

## 2020-11-05 MED ORDER — FAMOTIDINE 20 MG PO TABS: 20.0000 mg | ORAL_TABLET | Freq: Two times a day (BID) | ORAL | Status: DC

## 2020-11-05 NOTE — Progress Notes (Signed)
Primary Physician/Referring:  Audley Hose, MD  Patient ID: [name], female    DOB: 1945-05-29, 75 y.o.   MRN: [id number]  Chief Complaint  Patient presents with   Chest Pain   New Patient (Initial Visit)    Referred by Dr. Leretha Pol   HPI:    [name]  is a 75 y.o. Caucasian female with hypertension, hyperlipidemia, underlying first-degree AV block by EKG, was evaluated by her PCP on 10/26/2020 and she had complained about chest pain.  She is now referred to me for cardiac risk stratification.  Chest pain is described as tightness/pressure-like sensation in the upper parasternal region on the left side and it is continuous and it is mostly continuous.  She is continue to do physical activity including swimming regularly, walking around the neighborhood and there is no change in her symptoms or worsening chest pain.  Patient has lost about 40 pounds in weight over the past 2 years with extensive exercise, does swimming every day, also walks in the neighborhood without any chest discomfort or changes.  She denies dyspnea, cough, dizziness or syncope.  She has occasional leg cramps at night.  Past Medical History:  Diagnosis Date   Allergy    Arthritis    Cataract    BILATERAL-REMOVED   Contact dermatitis and other eczema, due to unspecified cause    Depression    Encounter for long-term (current) use of other medications    GERD (gastroesophageal reflux disease)    Hyperlipidemia    Hypertension    Osteopenia    Other and unspecified hyperlipidemia    Pain in joint, ankle and foot    Screening for malignant neoplasm of the cervix    Past Surgical History:  Procedure Laterality Date   ANKLE SURGERY  05/2006   Right Ankle   Carpel TUnnel Release  2004,2006   Right(2004), Left(2006)   CATARACT EXTRACTION Left 10/03/14   CATARACT EXTRACTION Right 11/16/2014   COLONOSCOPY     DIAGNOSTIC LAPAROSCOPY  1980   FINGER SURGERY Right    ring finger    JOINT REPLACEMENT     Total Knee(L)-11/05/07   KNEE ARTHROPLASTY  05/2007   Left   RHINOPLASTY  1969   TONSILLECTOMY     @ 75 y.o.   TOTAL ANKLE ARTHROPLASTY  10/30/2011   Procedure: TOTAL ANKLE ARTHOPLASTY;  Surgeon: Wylene Simmer, MD;  Location: [facility];  Service: Orthopedics;  Laterality: Right;  RIGHT TOTAL ANKLE REPLACEMENT, POSSIBLE GASTROC RESESSION   TOTAL KNEE ARTHROPLASTY Right 10/30/2017   Procedure: RIGHT TOTAL KNEE ARTHROPLASTY;  Surgeon: Netta Cedars, MD;  Location: [facility];  Service: Orthopedics;  Laterality: Right;   TUBAL LIGATION  1980   Family History  Problem Relation Age of Onset   Cancer Mother 62       breast   Breast cancer Mother    Stomach cancer Maternal Grandfather    Colon cancer Paternal Grandfather    Esophageal cancer Neg Hx    Rectal cancer Neg Hx     Social History   Tobacco Use   Smoking status: Former    Packs/day: 0.50    Years: 17.00    Pack years: 8.50    Types: Cigarettes    Quit date: 12/17/1979    Years since quitting: 40.9   Smokeless tobacco: Never  Substance Use Topics   Alcohol use: Yes    Alcohol/week: 0.0 standard drinks    Comment: occasional- socially    Marital  Status: Married  ROS  Review of Systems  Cardiovascular:  Positive for dyspnea on exertion. Negative for chest pain and leg swelling.  Musculoskeletal:  Positive for muscle cramps.  Gastrointestinal:  Negative for melena.  Objective  Blood pressure (!) 187/104, pulse 67, temperature 98.6 F (37 C), temperature source Temporal, resp. rate 16, height 5' (1.524 m), weight 137 lb (62.1 kg), SpO2 97 %. Body mass index is 26.76 kg/m.  Vitals with BMI 11/05/2020 11/05/2020 12/22/2018  Height - 5' 0"  -  Weight - 137 lbs -  BMI - 47.09 -  Systolic 628 366 294  Diastolic 765 84 80  Pulse 67 75 70     Physical Exam Neck:     Vascular: No carotid bruit or JVD.  Cardiovascular:     Rate and Rhythm: Normal rate and regular rhythm.     Pulses: Intact distal pulses.     Heart  sounds: Normal heart sounds. No murmur heard.   No gallop.  Pulmonary:     Effort: Pulmonary effort is normal.     Breath sounds: Normal breath sounds.  Abdominal:     General: Bowel sounds are normal.     Palpations: Abdomen is soft.  Musculoskeletal:        General: No swelling.     Laboratory examination:   External labs:   Labs 10/26/2020:  Total cholesterol 156, triglycerides 54, HDL 77, LDL 66.  Non-HDL cholesterol 79.  Serum glucose 90 mg, BUN 17, creatinine 0.90, EGFR 66 mm, potassium 4.7.  Vitamin D 32.  Hb 13.8/HCT 41.5, platelets 235. Medications and allergies   Allergies  Allergen Reactions   Dog Epithelium Allergy Skin Test    Molds & Smuts    Morphine And Related Itching     Medication prior to this encounter:   Outpatient Medications Prior to Visit  Medication Sig Dispense Refill   acetaminophen (TYLENOL) 500 MG tablet Take 500-1,000 mg by mouth as needed (for pain.).      aspirin 81 MG chewable tablet Chew 1 tablet by mouth daily.     Azelastine HCl 0.15 % SOLN Place 1-2 sprays into both nostrils as needed.     Biotin 5 MG TBDP Take 1 tablet by mouth daily.     Calcium-Phosphorus-Vitamin D (CALCIUM/D3 ADULT GUMMIES PO) Take 2 tablets by mouth daily.     Cholecalciferol (VITAMIN D3) 2000 units TABS Take 2,000 Units by mouth daily.     EPINEPHrine 0.3 mg/0.3 mL IJ SOAJ injection See admin instructions.     losartan (COZAAR) 50 MG tablet Take 25 mg by mouth daily.     montelukast (SINGULAIR) 10 MG tablet Take 10 mg by mouth at bedtime.     Multiple Vitamin (MULTIVITAMIN WITH MINERALS) TABS Take 1 tablet by mouth daily.     OPCON-A 0.027-0.315 % SOLN Place 1 drop into both eyes 2 (two) times daily as needed (allergy eyes).     famotidine (PEPCID) 20 MG tablet Take 20 mg by mouth 2 (two) times daily.     fexofenadine (ALLEGRA) 180 MG tablet Take 1 tablet by mouth as needed. (Patient not taking: Reported on 11/05/2020)     rosuvastatin (CRESTOR) 10 MG tablet  Take 1 tablet by mouth daily. (Patient not taking: Reported on 11/05/2020)     buPROPion (WELLBUTRIN XL) 150 MG 24 hr tablet TAKE ONE TABLET BY MOUTH ONE TIME DAILY  30 tablet 0   loratadine (CLARITIN) 10 MG tablet Take 10 mg by mouth daily.  No facility-administered medications prior to visit.     Medication list after today's encounter   Current Outpatient Medications  Medication Instructions   acetaminophen (TYLENOL) 500-1,000 mg, Oral, As needed   aspirin 81 MG chewable tablet 1 tablet, Oral, Daily   Azelastine HCl 0.15 % SOLN 1-2 sprays, Each Nare, As needed   Biotin 5 MG TBDP 1 tablet, Oral, Daily   Calcium-Phosphorus-Vitamin D (CALCIUM/D3 ADULT GUMMIES PO) 2 tablets, Oral, Daily   EPINEPHrine 0.3 mg/0.3 mL IJ SOAJ injection See admin instructions   famotidine (PEPCID) 20 mg, Oral, 2 times daily   fexofenadine (ALLEGRA) 180 MG tablet 1 tablet, As needed   losartan (COZAAR) 25 mg, Oral, Daily   montelukast (SINGULAIR) 10 mg, Oral, Daily at bedtime   Multiple Vitamin (MULTIVITAMIN WITH MINERALS) TABS 1 tablet, Daily   OPCON-A 0.027-0.315 % SOLN 1 drop, Both Eyes, 2 times daily PRN   rosuvastatin (CRESTOR) 10 MG tablet 1 tablet, Daily   Vitamin D3 2,000 Units, Oral, Daily    Radiology:   No results found.  Cardiac Studies:   NA  EKG:   EKG 11/05/2020: Normal sinus rhythm at rate of 64 bpm, left atrial enlargement, left axis deviation, left anterior fascicular block.  No evidence of ischemia.    Assessment     ICD-10-CM   1. Chest pain due to GERD  K21.9 EKG 12-Lead   R07.9 PCV ECHOCARDIOGRAM COMPLETE    PCV CARDIAC STRESS TEST    famotidine (PEPCID) 20 MG tablet    2. Gastroesophageal reflux disease without esophagitis  K21.9     3. Essential hypertension  I10 PCV ECHOCARDIOGRAM COMPLETE    4. Elevated blood pressure reading with diagnosis of hypertension  I10     5. Pure hypercholesterolemia  E78.00        Medications Discontinued During This Encounter   Medication Reason   buPROPion (WELLBUTRIN XL) 150 MG 24 hr tablet Error   loratadine (CLARITIN) 10 MG tablet Error   famotidine (PEPCID) 20 MG tablet Reorder    Meds ordered this encounter  Medications   famotidine (PEPCID) 20 MG tablet    Sig: Take 1 tablet (20 mg total) by mouth 2 (two) times daily.   Orders Placed This Encounter  Procedures   PCV CARDIAC STRESS TEST    Standing Status:   Future    Standing Expiration Date:   01/05/2021   EKG 12-Lead   PCV ECHOCARDIOGRAM COMPLETE    Standing Status:   Future    Standing Expiration Date:   11/05/2021   Recommendations:   [name] is a 75 y.o. Caucasian female with hypertension, hyperlipidemia, underlying first-degree AV block by EKG, was evaluated by her PCP on 10/26/2020 and she had complained about chest pain.  She is now referred to me for cardiac risk stratification.  Patient has lost about 40 pounds in weight over the past 2 years with extensive exercise, does swimming every day, also walks in the neighborhood without any chest discomfort or changes.  I suspect her chest pain is probably related to GERD as she has discontinued taking Pepcid.  Advised her to restart Pepcid to see whether the chest pain symptoms would improve.  However in view of her cardiac risk factors including hypertension, hyperlipidemia and age, I have recommended a routine treadmill stress test and also an echocardiogram.  Her blood pressure is markedly elevated.  I rechecked her blood pressure, continues to remain markedly elevated.  However patient has been recording her blood  pressure at home and also at the PCPs office blood pressure was relatively normal.  Hence I did not make any changes to her medication, she will monitor her blood pressure over the next 2 weeks at least 2-3 times a day and bring [place] the diary on her next office visit in 6 weeks.  She will contact us sooner if blood pressure continues to remain elevated.  She has bilateral  faint crackles in her lungs right worse than the left, she has had a recent chest x-ray which was negative.  Otherwise vascular examination and physical examination is unremarkable.  She does have hyperlipidemia, statin has been held for 3 to 4 weeks as she was complaining of leg cramps.  Vascular examination is normal.  Advised her to restart the Crestor if there is no change in her symptoms after 4 weeks.  Otherwise she will need a different statin.    Adrian Prows, MD, [facility] 11/05/2020, 10:30 AM Office: [phone]

## 2020-11-19 IMAGING — MG DIGITAL SCREENING BILAT W/ TOMO W/ CAD
8 series · 8 of 24 positions shown · non-contrast
Comparison: Previous exam(s).

CLINICAL DATA: Screening.

EXAM:
DIGITAL SCREENING BILATERAL MAMMOGRAM WITH TOMO AND CAD

[R CC synth-2D]
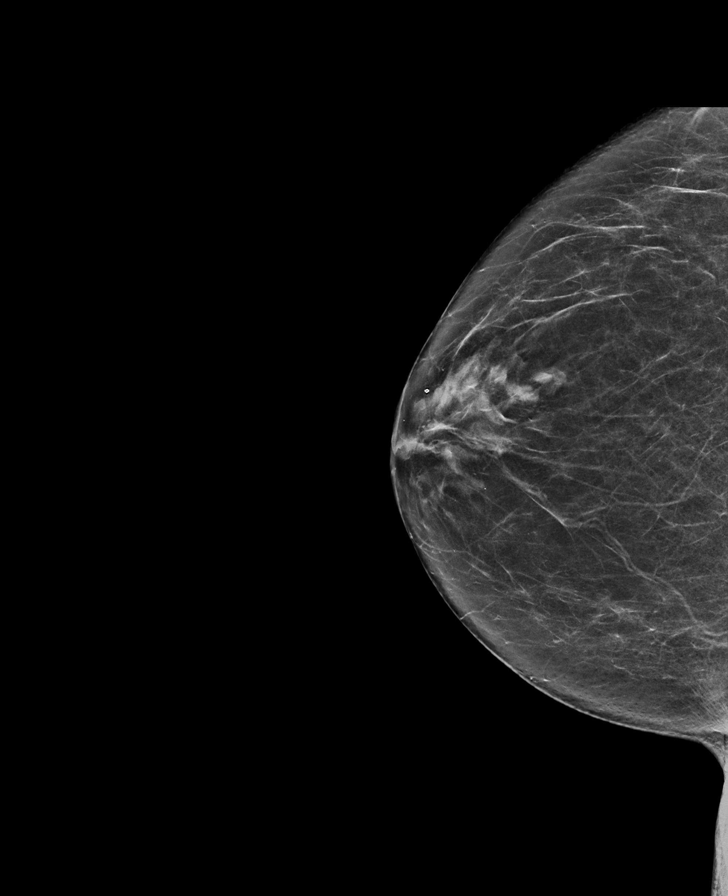

[L CC synth-2D]
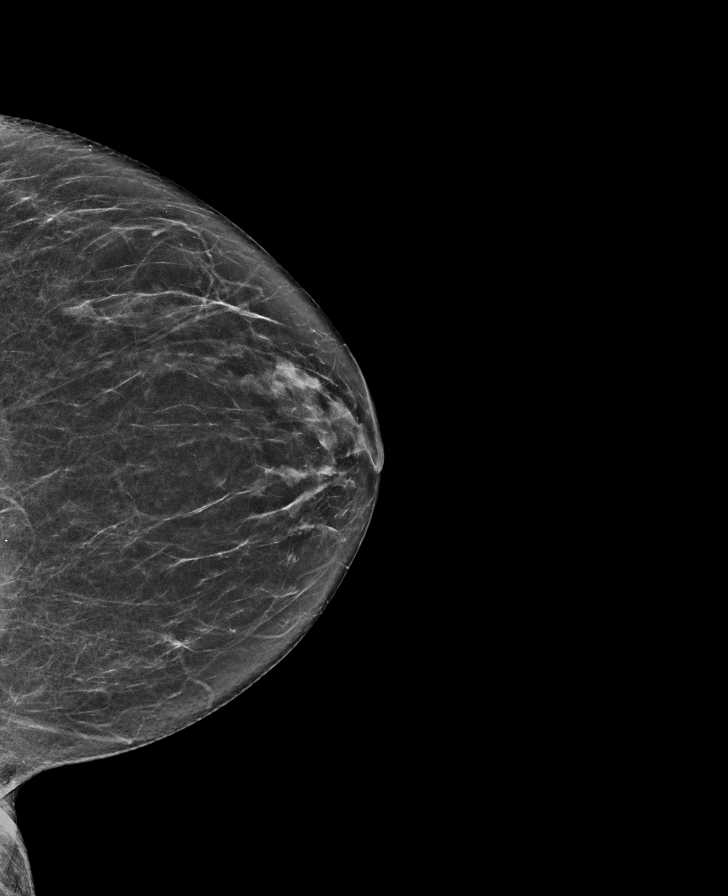

[L MLO synth-2D]
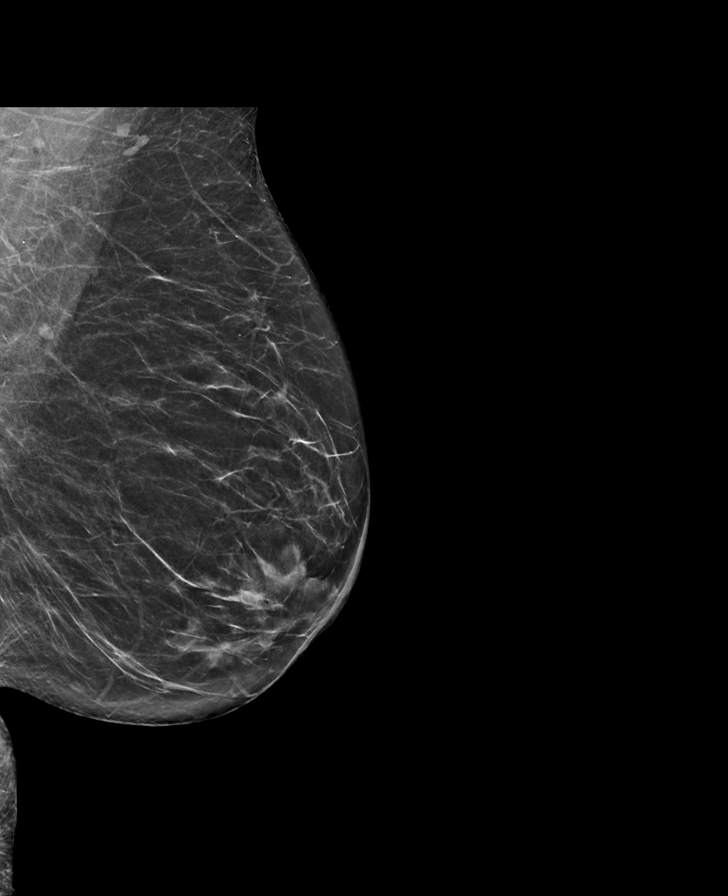

[R MLO synth-2D]
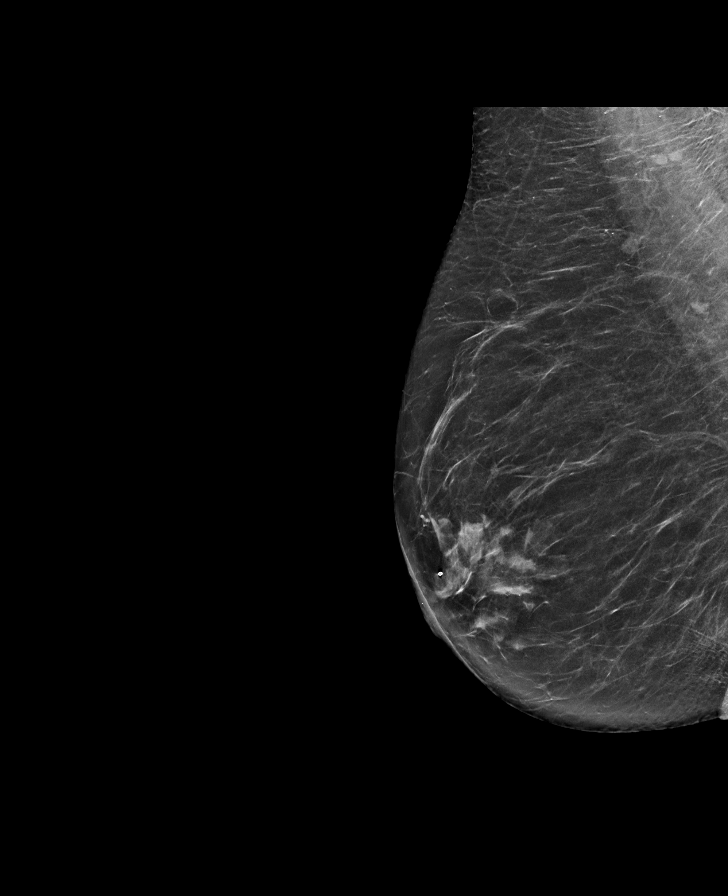

[R CC tomo · tomo slice 33/65.0]
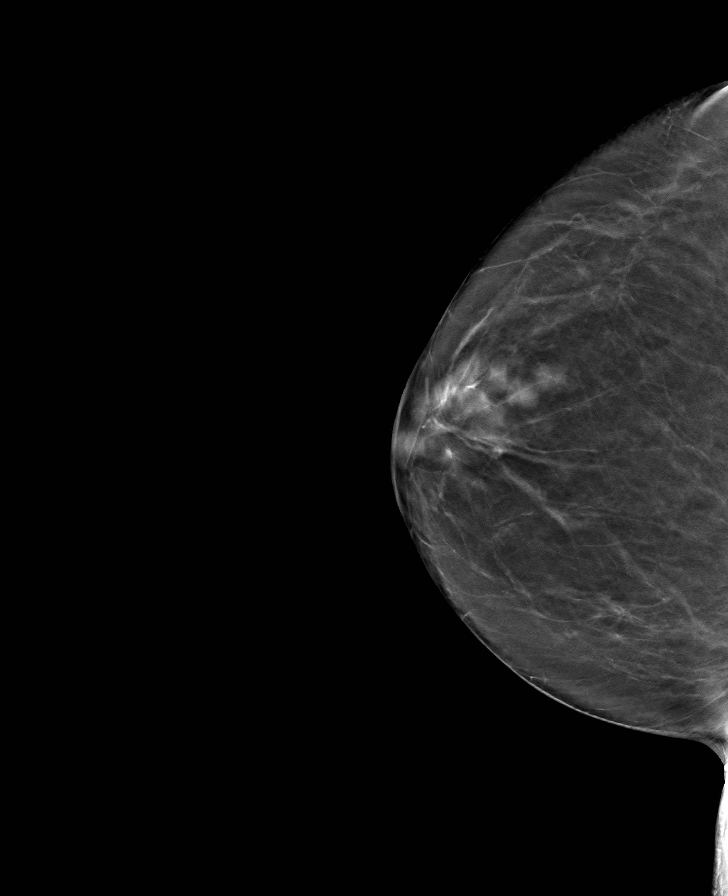

[R MLO tomo · tomo slice 34/67.0]
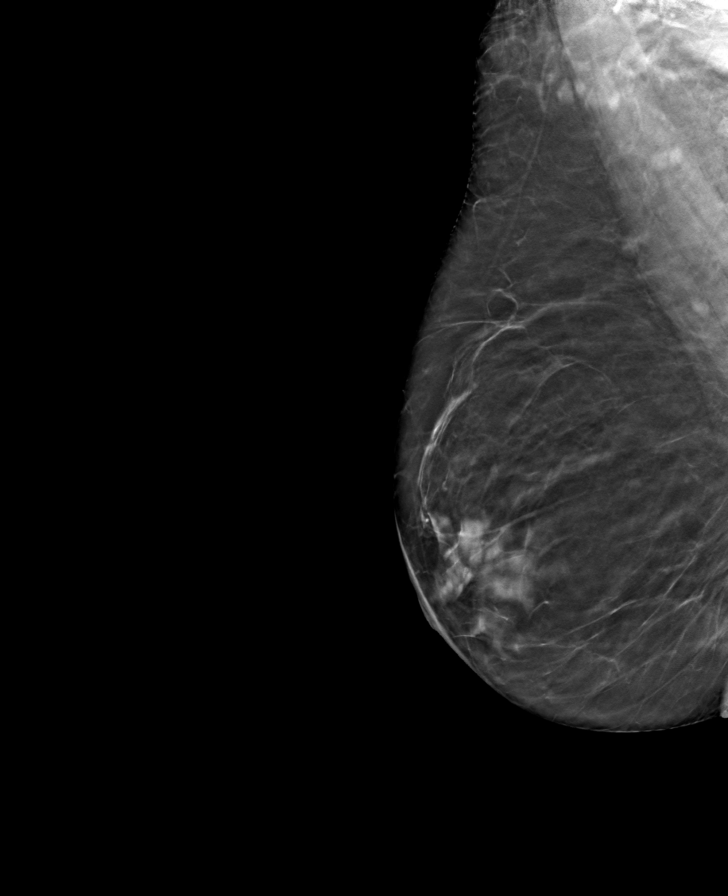

[L CC tomo · tomo slice 31/60.0]
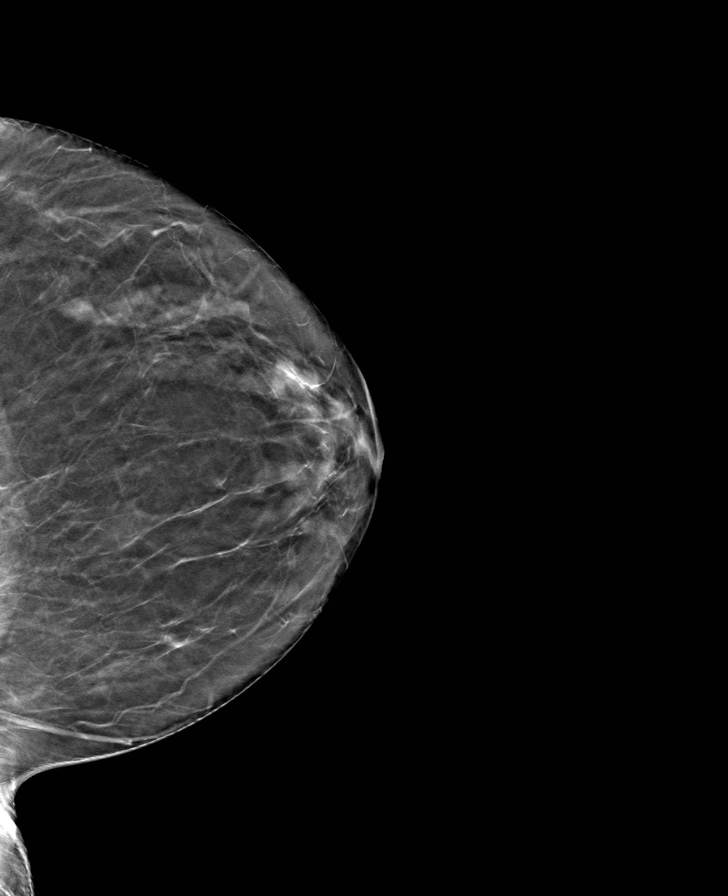

[L MLO tomo · tomo slice 34/67.0]
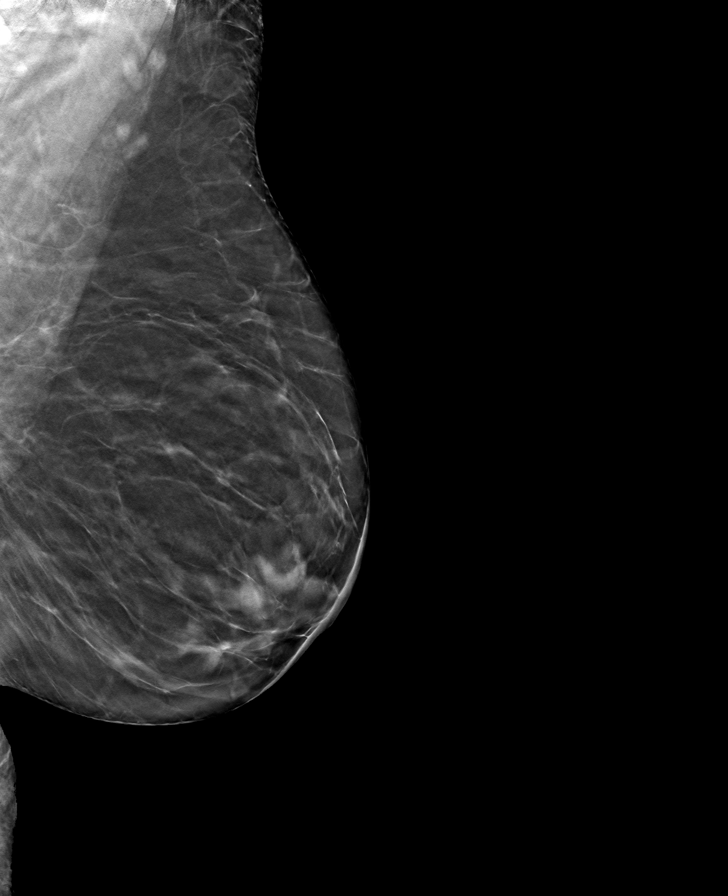

[8 of 24 positions shown; findings below may reference images not displayed]

ACR Breast Density Category b: There are scattered areas of
fibroglandular density.
FINDINGS: There are no findings suspicious for malignancy. Images were
processed with CAD.
IMPRESSION: No mammographic evidence of malignancy. A result letter of this
screening mammogram will be mailed directly to the patient.

RECOMMENDATION:
Screening mammogram in one year. (Code:[id number])

BI-RADS CATEGORY  1: Negative.

## 2020-11-22 DIAGNOSIS — M85852 Other specified disorders of bone density and structure, left thigh: Secondary | ICD-10-CM | POA: Diagnosis not present

## 2020-11-22 DIAGNOSIS — Z780 Asymptomatic menopausal state: Secondary | ICD-10-CM | POA: Diagnosis not present

## 2020-11-22 DIAGNOSIS — M85851 Other specified disorders of bone density and structure, right thigh: Secondary | ICD-10-CM | POA: Diagnosis not present

## 2020-12-07 ENCOUNTER — Other Ambulatory Visit: Payer: Medicare Other

## 2020-12-07 ENCOUNTER — Other Ambulatory Visit: Payer: Self-pay

## 2020-12-07 ENCOUNTER — Ambulatory Visit: Payer: Medicare Other

## 2020-12-07 DIAGNOSIS — I10 Essential (primary) hypertension: Secondary | ICD-10-CM | POA: Diagnosis not present

## 2020-12-07 DIAGNOSIS — R079 Chest pain, unspecified: Secondary | ICD-10-CM

## 2020-12-07 DIAGNOSIS — K219 Gastro-esophageal reflux disease without esophagitis: Secondary | ICD-10-CM

## 2020-12-17 ENCOUNTER — Encounter: Payer: Self-pay | Admitting: Student

## 2020-12-17 ENCOUNTER — Other Ambulatory Visit: Payer: Self-pay

## 2020-12-17 ENCOUNTER — Ambulatory Visit: Payer: Medicare Other | Admitting: Student

## 2020-12-17 VITALS — BP 148/82 | HR 80 | Resp 16 | Ht 60.0 in | Wt 146.4 lb

## 2020-12-17 DIAGNOSIS — I10 Essential (primary) hypertension: Secondary | ICD-10-CM

## 2020-12-17 DIAGNOSIS — R079 Chest pain, unspecified: Secondary | ICD-10-CM | POA: Diagnosis not present

## 2020-12-17 DIAGNOSIS — K219 Gastro-esophageal reflux disease without esophagitis: Secondary | ICD-10-CM | POA: Diagnosis not present

## 2020-12-17 MED ORDER — AMLODIPINE BESYLATE 2.5 MG PO TABS: 2.5000 mg | ORAL_TABLET | Freq: Every day | ORAL | 3 refills | Status: DC

## 2020-12-17 NOTE — Progress Notes (Signed)
Primary Physician/Referring:  Audley Hose, MD  Patient ID: [name], female    DOB: Oct 26, 1945, 75 y.o.   MRN: [id number]  Chief Complaint  Patient presents with   Chest Pain   Hypertension   Follow-up   HPI:    [name]  is a 75 y.o. Caucasian female with hypertension, hyperlipidemia, underlying first-degree AV block by EKG, was evaluated by her PCP on 10/26/2020 and she had complained about chest pain.  Patient originally referred to our office for cardiac risk stratification and evaluation of atypical chest pain.  She established care with our office and was seen by Dr. Einar Gip 11/05/2020 at which time she was advised to restart Pepcid as there was concern chest pain was related to underlying GERD.  She was also advised to monitor blood pressure and bring a written log to her next office visit.  Patient underwent echocardiogram with noted preserved LVEF with mild valvular disease.  Exercise treadmill stress test was overall low risk. She now presents for follow up of atypical chest pain and hypertension.  Patient is now taking Pepcid twice daily and chest discomfort symptoms are occurring less frequently.  She does continue to have occasional episodes of chest discomfort which she describes slants pressure, particularly when she wakes up in the morning.  She remains physically active walking and swimming regularly.  Chest discomfort is nonexertional.  She is also tolerating Crestor without issue.  Reports home blood pressure readings averaging 130/72 mmHg, however does admit to occasional blood pressure spikes >767 mmHg systolic.  Past Medical History:  Diagnosis Date   Allergy    Arthritis    Cataract    BILATERAL-REMOVED   Contact dermatitis and other eczema, due to unspecified cause    Depression    Encounter for long-term (current) use of other medications    GERD (gastroesophageal reflux disease)    Hyperlipidemia    Hypertension    Osteopenia     Other and unspecified hyperlipidemia    Pain in joint, ankle and foot    Screening for malignant neoplasm of the cervix    Past Surgical History:  Procedure Laterality Date   ANKLE SURGERY  05/2006   Right Ankle   Carpel TUnnel Release  2004,2006   Right(2004), Left(2006)   CATARACT EXTRACTION Left 10/03/14   CATARACT EXTRACTION Right 11/16/2014   COLONOSCOPY     DIAGNOSTIC LAPAROSCOPY  1980   FINGER SURGERY Right    ring finger   JOINT REPLACEMENT     Total Knee(L)-11/05/07   KNEE ARTHROPLASTY  05/2007   Left   RHINOPLASTY  1969   TONSILLECTOMY     @ 75 y.o.   TOTAL ANKLE ARTHROPLASTY  10/30/2011   Procedure: TOTAL ANKLE ARTHOPLASTY;  Surgeon: Wylene Simmer, MD;  Location: [place];  Service: Orthopedics;  Laterality: Right;  RIGHT TOTAL ANKLE REPLACEMENT, POSSIBLE GASTROC RESESSION   TOTAL KNEE ARTHROPLASTY Right 10/30/2017   Procedure: RIGHT TOTAL KNEE ARTHROPLASTY;  Surgeon: Netta Cedars, MD;  Location: [place];  Service: Orthopedics;  Laterality: Right;   TUBAL LIGATION  1980   Family History  Problem Relation Age of Onset   Cancer Mother 85       breast   Breast cancer Mother    Stomach cancer Maternal Grandfather    Colon cancer Paternal Grandfather    Esophageal cancer Neg Hx    Rectal cancer Neg Hx     Social History   Tobacco Use   Smoking status: Former  Packs/day: 0.50    Years: 17.00    Pack years: 8.50    Types: Cigarettes    Quit date: 12/17/1979    Years since quitting: 41.0   Smokeless tobacco: Never  Substance Use Topics   Alcohol use: Yes    Alcohol/week: 0.0 standard drinks    Comment: occasional- socially    Marital Status: Married  ROS  Review of Systems  Constitutional: Positive for weight gain (over the last few months).  Cardiovascular:  Positive for dyspnea on exertion. Negative for chest pain, claudication, leg swelling, near-syncope, orthopnea, palpitations, paroxysmal nocturnal dyspnea and syncope.  Respiratory:  Negative for shortness of  breath.   Musculoskeletal:  Negative for muscle cramps (improved).  Gastrointestinal:  Negative for melena.  Neurological:  Negative for dizziness.  Objective  Blood pressure (!) 148/82, pulse 80, resp. rate 16, height 5' (1.524 m), weight 146 lb 6.4 oz (66.4 kg), SpO2 96 %. Body mass index is 28.59 kg/m.  Vitals with BMI 12/17/2020 11/05/2020 11/05/2020  Height $Remov'5\' 0"'YTiHUI$  - $'5\' 0"'h$   Weight 146 lbs 6 oz - 137 lbs  BMI 97.02 - 63.78  Systolic 588 502 774  Diastolic 82 128 84  Pulse 80 67 75     Physical Exam Vitals reviewed.  Neck:     Vascular: No carotid bruit or JVD.  Cardiovascular:     Rate and Rhythm: Normal rate and regular rhythm.     Pulses: Intact distal pulses.     Heart sounds: Normal heart sounds. No murmur heard.   No gallop.  Pulmonary:     Effort: Pulmonary effort is normal.     Breath sounds: Normal breath sounds.  Abdominal:     General: Bowel sounds are normal.     Palpations: Abdomen is soft.  Musculoskeletal:        General: No swelling.  Physical exam unchanged compared to previous office visit.  Laboratory examination:   External labs:   Labs 10/26/2020:  Total cholesterol 156, triglycerides 54, HDL 77, LDL 66.  Non-HDL cholesterol 79.  Serum glucose 90 mg, BUN 17, creatinine 0.90, EGFR 66 mm, potassium 4.7.  Vitamin D 32.  Hb 13.8/HCT 41.5, platelets 235.  Allergies   Allergies  Allergen Reactions   Dog Epithelium Allergy Skin Test    Molds & Smuts    Morphine And Related Itching     Medication prior to this encounter:   Outpatient Medications Prior to Visit  Medication Sig Dispense Refill   acetaminophen (TYLENOL) 500 MG tablet Take 500-1,000 mg by mouth as needed (for pain.).      Azelastine HCl 0.15 % SOLN Place 1-2 sprays into both nostrils as needed.     Biotin 5 MG TBDP Take 1 tablet by mouth daily.     Calcium-Phosphorus-Vitamin D (CALCIUM/D3 ADULT GUMMIES PO) Take 2 tablets by mouth daily.     Cholecalciferol (VITAMIN D3) 2000 units  TABS Take 2,000 Units by mouth daily.     EPINEPHrine 0.3 mg/0.3 mL IJ SOAJ injection See admin instructions.     famotidine (PEPCID) 20 MG tablet Take 1 tablet (20 mg total) by mouth 2 (two) times daily.     fexofenadine (ALLEGRA) 180 MG tablet Take 1 tablet by mouth as needed.     losartan (COZAAR) 50 MG tablet Take 25 mg by mouth daily.     montelukast (SINGULAIR) 10 MG tablet Take 10 mg by mouth at bedtime.     Multiple Vitamin (MULTIVITAMIN WITH MINERALS) TABS Take 1  tablet by mouth daily.     OPCON-A 0.027-0.315 % SOLN Place 1 drop into both eyes 2 (two) times daily as needed (allergy eyes).     rosuvastatin (CRESTOR) 10 MG tablet Take 1 tablet by mouth daily.     aspirin 81 MG chewable tablet Chew 1 tablet by mouth daily.     No facility-administered medications prior to visit.     Medication list after today's encounter   Current Outpatient Medications  Medication Instructions   acetaminophen (TYLENOL) 500-1,000 mg, Oral, As needed   amLODipine (NORVASC) 2.5 mg, Oral, Daily   Azelastine HCl 0.15 % SOLN 1-2 sprays, Each Nare, As needed   Biotin 5 MG TBDP 1 tablet, Oral, Daily   Calcium-Phosphorus-Vitamin D (CALCIUM/D3 ADULT GUMMIES PO) 2 tablets, Oral, Daily   EPINEPHrine 0.3 mg/0.3 mL IJ SOAJ injection See admin instructions   famotidine (PEPCID) 20 mg, Oral, 2 times daily   fexofenadine (ALLEGRA) 180 MG tablet 1 tablet, Oral, As needed   losartan (COZAAR) 25 mg, Oral, Daily   montelukast (SINGULAIR) 10 mg, Oral, Daily at bedtime   Multiple Vitamin (MULTIVITAMIN WITH MINERALS) TABS 1 tablet, Daily   OPCON-A 0.027-0.315 % SOLN 1 drop, Both Eyes, 2 times daily PRN   rosuvastatin (CRESTOR) 10 MG tablet 1 tablet, Oral, Daily   Vitamin D3 2,000 Units, Oral, Daily    Radiology:   No results found.  Cardiac Studies:   PCV CARDIAC STRESS TEST 12/07/2020 Functional status: Good. Chest pain: No. Reason for stopping exercise: Fatigue/weakness. Hypertensive response to  exercise: No. Exercise time 7 minutes 59 seconds on Bruce protocol, achieved 10.12 METS, 112% of age-predicted maximum heart rate. Stress ECG negative for ischemia. Low risk study.  PCV ECHOCARDIOGRAM COMPLETE 08/03/7626 Normal LV systolic function with visual EF 60-65%. Left ventricle cavity is normal in size. Normal left ventricular wall thickness with basal septal hypertrophy. Normal global wall motion. No obvious regional wall motion abnormalities. Indeterminate diastolic filling pattern, normal LAP. Left atrial cavity is mildly dilated. Mild (Grade I) mitral regurgitation. Mild tricuspid regurgitation. No evidence of pulmonary hypertension. Structurally normal pulmonic valve. No evidence of pulmonic stenosis. Mild pulmonic regurgitation. No prior study for comparison.   EKG:   EKG 11/05/2020: Normal sinus rhythm at rate of 64 bpm, left atrial enlargement, left axis deviation, left anterior fascicular block.  No evidence of ischemia.    Assessment     ICD-10-CM   1. Essential hypertension  I10     2. Chest pain due to GERD  K21.9    R07.9        Medications Discontinued During This Encounter  Medication Reason   aspirin 81 MG chewable tablet Error    Meds ordered this encounter  Medications   amLODipine (NORVASC) 2.5 MG tablet    Sig: Take 1 tablet (2.5 mg total) by mouth daily.    Dispense:  90 tablet    Refill:  3   No orders of the defined types were placed in this encounter.  Recommendations:   Imelda Dandridge is a 75 y.o. Caucasian female with hypertension, hyperlipidemia, underlying first-degree AV block by EKG, was evaluated by her PCP on 10/26/2020 and she had complained about chest pain.  Patient originally referred to our office for cardiac risk stratification and evaluation of atypical chest pain.  She established care with our office and was seen by Dr. Einar Gip 11/05/2020 at which time she was advised to restart Pepcid as there was concern chest pain was  related to  underlying GERD.  She was also advised to monitor blood pressure and bring a written log to her next office visit.  Patient underwent echocardiogram with noted preserved LVEF with mild valvular disease.Marland Kitchen  Exercise treadmill stress test was overall low risk. She now presents for follow up of atypical chest pain and hypertension.  Reviewed and discussed with patient regarding results of echocardiogram and stress test, details above.  Echo and stress test were both reassuring.  Suspect chest discomfort is noncardiac.  Encouraged her to continue Crestor and Pepcid.  Recommend primary prevention.  Patient's blood pressure remains mildly elevated above goal, therefore shared decision was to add amlodipine 2.5 mg once daily.  Patient will continue to monitor blood pressure regularly at home.  Also counseled patient regarding DASH diet and weight loss, patient appears motivated.  Follow-up in 3 months, sooner if needed, for hypertension and primary prevention.   Alethia Berthold, PA-C 12/17/2020, 1:57 PM Office: [phone]

## 2021-02-28 ENCOUNTER — Telehealth: Payer: Self-pay

## 2021-02-28 NOTE — Telephone Encounter (Signed)
Patient called and said she needs clearance to her gym so she can exercise

## 2021-03-08 DIAGNOSIS — H6992 Unspecified Eustachian tube disorder, left ear: Secondary | ICD-10-CM | POA: Diagnosis not present

## 2021-03-18 NOTE — Progress Notes (Signed)
Primary Physician/Referring:  Audley Hose, MD  Patient ID: [name], female    DOB: Dec 21, 1945, 76 y.o.   MRN: [id number]  Chief Complaint  Patient presents with   Hypertension   Follow-up   HPI:    [name]  is a 76 y.o. Caucasian female with hypertension, hyperlipidemia, underlying first-degree AV block by EKG, was evaluated by her PCP on 10/26/2020 and she had complained about chest pain.  Patient originally referred to our office for cardiac risk stratification and evaluation of atypical chest pain.  Patient underwent echocardiogram and stress test which were both reassuring, therefore suspect noncardiac etiology of chest pain patient was advised to take Pepcid which improved symptoms.  Last office visit blood pressure remained elevated above goal, therefore added amlodipine 2.5 mg once daily.  Patient now presents for 102-monthfollow-up.  She is tolerating addition of amlodipine without issue and blood pressure is not well controlled.  She exercises regularly doing swimming, cardio activity, and weightlifting without issue.  She has had no recurrence of chest pain.  Past Medical History:  Diagnosis Date   Allergy    Arthritis    Cataract    BILATERAL-REMOVED   Contact dermatitis and other eczema, due to unspecified cause    Depression    Encounter for long-term (current) use of other medications    GERD (gastroesophageal reflux disease)    Hyperlipidemia    Hypertension    Osteopenia    Other and unspecified hyperlipidemia    Pain in joint, ankle and foot    Screening for malignant neoplasm of the cervix    Past Surgical History:  Procedure Laterality Date   ANKLE SURGERY  05/2006   Right Ankle   Carpel TUnnel Release  2004,2006   Right(2004), Left(2006)   CATARACT EXTRACTION Left 10/03/14   CATARACT EXTRACTION Right 11/16/2014   COLONOSCOPY     DIAGNOSTIC LAPAROSCOPY  1980   FINGER SURGERY Right    ring finger   JOINT REPLACEMENT      Total Knee(L)-11/05/07   KNEE ARTHROPLASTY  05/2007   Left   RHINOPLASTY  1969   TONSILLECTOMY     @ 76y.o.   TOTAL ANKLE ARTHROPLASTY  10/30/2011   Procedure: TOTAL ANKLE ARTHOPLASTY;  Surgeon: JWylene Simmer MD;  [place]  Service: Orthopedics;  Laterality: Right;  RIGHT TOTAL ANKLE REPLACEMENT, POSSIBLE GASTROC RESESSION   TOTAL KNEE ARTHROPLASTY Right 10/30/2017   Procedure: RIGHT TOTAL KNEE ARTHROPLASTY;  Surgeon: NNetta Cedars MD;  Location[place] [facility]  Service: Orthopedics;  Laterality: Right;   TUBAL LIGATION  1980   Family History  Problem Relation Age of Onset   Cancer Mother 969      breast   Breast cancer Mother    Stomach cancer Maternal Grandfather    Colon cancer Paternal Grandfather    Esophageal cancer Neg Hx    Rectal cancer Neg Hx     Social History   Tobacco Use   Smoking status: Former    Packs/day: 0.50    Years: 17.00    Pack years: 8.50    Types: Cigarettes    Quit date: 12/17/1979    Years since quitting: 41.2   Smokeless tobacco: Never  Substance Use Topics   Alcohol use: Yes    Alcohol/week: 0.0 standard drinks    Comment: occasional- socially    Marital Status: Married  ROS  Review of Systems  Cardiovascular:  Negative for chest pain, claudication, leg swelling, near-syncope,  orthopnea, palpitations, paroxysmal nocturnal dyspnea and syncope.  Respiratory:  Negative for shortness of breath.   Neurological:  Negative for dizziness.  Objective  Blood pressure 111/71, pulse 80, temperature 97.7 F (36.5 C), temperature source Temporal, weight 145 lb (65.8 kg), SpO2 98 %. Body mass index is 28.32 kg/m.  Vitals with BMI 03/19/2021 12/17/2020 11/05/2020  Height - 5' 0"  -  Weight 145 lbs 146 lbs 6 oz -  BMI - 40.97 -  Systolic 353 299 242  Diastolic 71 82 683  Pulse 80 80 67     Physical Exam Vitals reviewed.  Neck:     Vascular: No carotid bruit or JVD.  Cardiovascular:     Rate and Rhythm: Normal rate and regular rhythm.     Pulses: Intact  distal pulses.     Heart sounds: Normal heart sounds. No murmur heard.   No gallop.  Pulmonary:     Effort: Pulmonary effort is normal.     Breath sounds: Normal breath sounds.  Musculoskeletal:     Right lower leg: No edema.     Left lower leg: No edema.   Laboratory examination:   External labs:  10/26/2020: Total cholesterol 156, triglycerides 54, HDL 77, LDL 66.  Non-HDL cholesterol 79. Serum glucose 90 mg, BUN 17, creatinine 0.90, EGFR 66 mm, potassium 4.7. Vitamin D 32. Hb 13.8/HCT 41.5, platelets 235.  Allergies   Allergies  Allergen Reactions   Dog Epithelium Allergy Skin Test    Molds & Smuts    Morphine And Related Itching    Medication prior to this encounter:   Outpatient Medications Prior to Visit  Medication Sig Dispense Refill   acetaminophen (TYLENOL) 500 MG tablet Take 500-1,000 mg by mouth as needed (for pain.).      amLODipine (NORVASC) 2.5 MG tablet Take 1 tablet (2.5 mg total) by mouth daily. 90 tablet 3   Azelastine HCl 0.15 % SOLN Place 1-2 sprays into both nostrils as needed.     Biotin 5 MG TBDP Take 1 tablet by mouth daily.     Calcium-Phosphorus-Vitamin D (CALCIUM/D3 ADULT GUMMIES PO) Take 2 tablets by mouth daily.     Cholecalciferol (VITAMIN D3) 2000 units TABS Take 2,000 Units by mouth daily.     EPINEPHrine 0.3 mg/0.3 mL IJ SOAJ injection See admin instructions.     famotidine (PEPCID) 20 MG tablet Take 1 tablet (20 mg total) by mouth 2 (two) times daily.     fexofenadine (ALLEGRA) 180 MG tablet Take 1 tablet by mouth as needed.     losartan (COZAAR) 50 MG tablet Take 50 mg by mouth daily.     montelukast (SINGULAIR) 10 MG tablet Take 10 mg by mouth at bedtime.     Multiple Vitamin (MULTIVITAMIN WITH MINERALS) TABS Take 1 tablet by mouth daily.     OPCON-A 0.027-0.315 % SOLN Place 1 drop into both eyes 2 (two) times daily as needed (allergy eyes).     rosuvastatin (CRESTOR) 10 MG tablet Take 1 tablet by mouth daily.     No  facility-administered medications prior to visit.    Medication list after today's encounter   Current Outpatient Medications  Medication Instructions   acetaminophen (TYLENOL) 500-1,000 mg, Oral, As needed   amLODipine (NORVASC) 2.5 mg, Oral, Daily   Azelastine HCl 0.15 % SOLN 1-2 sprays, Each Nare, As needed   Biotin 5 MG TBDP 1 tablet, Oral, Daily   Calcium-Phosphorus-Vitamin D (CALCIUM/D3 ADULT GUMMIES PO) 2 tablets, Oral, Daily  EPINEPHrine 0.3 mg/0.3 mL IJ SOAJ injection See admin instructions   famotidine (PEPCID) 20 mg, Oral, 2 times daily   fexofenadine (ALLEGRA) 180 MG tablet 1 tablet, Oral, As needed   losartan (COZAAR) 50 mg, Oral, Daily   montelukast (SINGULAIR) 10 mg, Oral, Daily at bedtime   Multiple Vitamin (MULTIVITAMIN WITH MINERALS) TABS 1 tablet, Daily   OPCON-A 0.027-0.315 % SOLN 1 drop, Both Eyes, 2 times daily PRN   rosuvastatin (CRESTOR) 10 MG tablet 1 tablet, Oral, Daily   Vitamin D3 2,000 Units, Oral, Daily    Radiology:   No results found.  Cardiac Studies:   PCV CARDIAC STRESS TEST 12/07/2020 Functional status: Good. Chest pain: No. Reason for stopping exercise: Fatigue/weakness. Hypertensive response to exercise: No. Exercise time 7 minutes 59 seconds on Bruce protocol, achieved 10.12 METS, 112% of age-predicted maximum heart rate. Stress ECG negative for ischemia. Low risk study.  PCV ECHOCARDIOGRAM COMPLETE 92/92/4462 Normal LV systolic function with visual EF 60-65%. Left ventricle cavity is normal in size. Normal left ventricular wall thickness with basal septal hypertrophy. Normal global wall motion. No obvious regional wall motion abnormalities. Indeterminate diastolic filling pattern, normal LAP. Left atrial cavity is mildly dilated. Mild (Grade I) mitral regurgitation. Mild tricuspid regurgitation. No evidence of pulmonary hypertension. Structurally normal pulmonic valve. No evidence of pulmonic stenosis. Mild pulmonic regurgitation. No  prior study for comparison.   EKG:  03/19/2021: Sinus rhythm at a rate of 70 bpm.  Left axis, left anterior fascicular block.  Left atrial enlargement.  Poor R wave pression, cannot exclude anteroseptal infarct old.  Compared EKG 11/05/2020, no significant change.  EKG 11/05/2020: Normal sinus rhythm at rate of 64 bpm, left atrial enlargement, left axis deviation, left anterior fascicular block.  No evidence of ischemia.    Assessment     ICD-10-CM   1. Essential hypertension  I10 EKG 12-Lead       There are no discontinued medications.   No orders of the defined types were placed in this encounter.  Orders Placed This Encounter  Procedures   EKG 12-Lead    Recommendations:   Kashia Brossard is a 76 y.o. Caucasian female with hypertension, hyperlipidemia, underlying first-degree AV block by EKG, was evaluated by her PCP on 10/26/2020 and she had complained about chest pain.  Patient originally referred to our office for cardiac risk stratification and evaluation of atypical chest pain.  Patient underwent echocardiogram and stress test which were both reassuring, therefore suspect noncardiac etiology of chest pain patient was advised to take Pepcid which improved symptoms.  Last office visit blood pressure remained elevated above goal, therefore added amlodipine 2.5 mg once daily.  Patient now presents for 66-monthfollow-up.  Patient is tolerating addition of amlodipine without issue and blood pressure is now well controlled, will continue this.  Physical exam and EKG are unchanged compared to previous.  Patient remains active.  Will not make changes today as patient is stable from a cardiovascular standpoint.  Follow-up in 1 year, sooner if needed for hypertension, hyperlipidemia, primary prevention.   CAlethia Berthold PA-C 03/19/2021, 10:12 AM Office: [phone]

## 2021-03-19 ENCOUNTER — Ambulatory Visit: Payer: Medicare Other | Admitting: Student

## 2021-03-19 ENCOUNTER — Encounter: Payer: Self-pay | Admitting: Student

## 2021-03-19 ENCOUNTER — Other Ambulatory Visit: Payer: Self-pay

## 2021-03-19 VITALS — BP 111/71 | HR 80 | Temp 97.7°F | Wt 145.0 lb

## 2021-03-19 DIAGNOSIS — I10 Essential (primary) hypertension: Secondary | ICD-10-CM | POA: Diagnosis not present

## 2021-03-21 DIAGNOSIS — J3089 Other allergic rhinitis: Secondary | ICD-10-CM | POA: Diagnosis not present

## 2021-03-21 DIAGNOSIS — J3081 Allergic rhinitis due to animal (cat) (dog) hair and dander: Secondary | ICD-10-CM | POA: Diagnosis not present

## 2021-03-21 DIAGNOSIS — J301 Allergic rhinitis due to pollen: Secondary | ICD-10-CM | POA: Diagnosis not present

## 2021-04-11 DIAGNOSIS — E785 Hyperlipidemia, unspecified: Secondary | ICD-10-CM | POA: Diagnosis not present

## 2021-04-11 DIAGNOSIS — E559 Vitamin D deficiency, unspecified: Secondary | ICD-10-CM | POA: Diagnosis not present

## 2021-04-11 DIAGNOSIS — I10 Essential (primary) hypertension: Secondary | ICD-10-CM | POA: Diagnosis not present

## 2021-04-11 DIAGNOSIS — Z0000 Encounter for general adult medical examination without abnormal findings: Secondary | ICD-10-CM | POA: Diagnosis not present

## 2021-04-22 DIAGNOSIS — H748X9 Other specified disorders of middle ear and mastoid, unspecified ear: Secondary | ICD-10-CM | POA: Diagnosis not present

## 2021-04-22 DIAGNOSIS — E785 Hyperlipidemia, unspecified: Secondary | ICD-10-CM | POA: Diagnosis not present

## 2021-04-22 DIAGNOSIS — R0789 Other chest pain: Secondary | ICD-10-CM | POA: Diagnosis not present

## 2021-04-22 DIAGNOSIS — R252 Cramp and spasm: Secondary | ICD-10-CM | POA: Diagnosis not present

## 2021-04-22 DIAGNOSIS — M8580 Other specified disorders of bone density and structure, unspecified site: Secondary | ICD-10-CM | POA: Diagnosis not present

## 2021-04-22 DIAGNOSIS — I10 Essential (primary) hypertension: Secondary | ICD-10-CM | POA: Diagnosis not present

## 2021-05-07 DIAGNOSIS — D1801 Hemangioma of skin and subcutaneous tissue: Secondary | ICD-10-CM | POA: Diagnosis not present

## 2021-05-07 DIAGNOSIS — D225 Melanocytic nevi of trunk: Secondary | ICD-10-CM | POA: Diagnosis not present

## 2021-05-07 DIAGNOSIS — L818 Other specified disorders of pigmentation: Secondary | ICD-10-CM | POA: Diagnosis not present

## 2021-05-07 DIAGNOSIS — D485 Neoplasm of uncertain behavior of skin: Secondary | ICD-10-CM | POA: Diagnosis not present

## 2021-05-07 DIAGNOSIS — L814 Other melanin hyperpigmentation: Secondary | ICD-10-CM | POA: Diagnosis not present

## 2021-05-07 DIAGNOSIS — L821 Other seborrheic keratosis: Secondary | ICD-10-CM | POA: Diagnosis not present

## 2021-05-07 DIAGNOSIS — F329 Major depressive disorder, single episode, unspecified: Secondary | ICD-10-CM | POA: Diagnosis not present

## 2021-05-07 DIAGNOSIS — I10 Essential (primary) hypertension: Secondary | ICD-10-CM | POA: Diagnosis not present

## 2021-05-07 DIAGNOSIS — Z85828 Personal history of other malignant neoplasm of skin: Secondary | ICD-10-CM | POA: Diagnosis not present

## 2021-06-11 DIAGNOSIS — M79671 Pain in right foot: Secondary | ICD-10-CM | POA: Diagnosis not present

## 2021-06-11 DIAGNOSIS — M76811 Anterior tibial syndrome, right leg: Secondary | ICD-10-CM | POA: Diagnosis not present

## 2021-06-11 DIAGNOSIS — Z96661 Presence of right artificial ankle joint: Secondary | ICD-10-CM | POA: Diagnosis not present

## 2021-06-11 DIAGNOSIS — M25571 Pain in right ankle and joints of right foot: Secondary | ICD-10-CM | POA: Diagnosis not present

## 2021-06-12 DIAGNOSIS — H6982 Other specified disorders of Eustachian tube, left ear: Secondary | ICD-10-CM | POA: Diagnosis not present

## 2021-06-12 DIAGNOSIS — H903 Sensorineural hearing loss, bilateral: Secondary | ICD-10-CM | POA: Diagnosis not present

## 2021-06-12 DIAGNOSIS — J310 Chronic rhinitis: Secondary | ICD-10-CM | POA: Diagnosis not present

## 2021-06-12 DIAGNOSIS — H838X3 Other specified diseases of inner ear, bilateral: Secondary | ICD-10-CM | POA: Diagnosis not present

## 2021-07-03 DIAGNOSIS — M76811 Anterior tibial syndrome, right leg: Secondary | ICD-10-CM | POA: Diagnosis not present

## 2021-07-03 DIAGNOSIS — M25571 Pain in right ankle and joints of right foot: Secondary | ICD-10-CM | POA: Diagnosis not present

## 2021-07-24 DIAGNOSIS — M76811 Anterior tibial syndrome, right leg: Secondary | ICD-10-CM | POA: Diagnosis not present

## 2021-08-05 DIAGNOSIS — G4701 Insomnia due to medical condition: Secondary | ICD-10-CM | POA: Diagnosis not present

## 2021-08-05 DIAGNOSIS — F330 Major depressive disorder, recurrent, mild: Secondary | ICD-10-CM | POA: Diagnosis not present

## 2021-08-05 DIAGNOSIS — E785 Hyperlipidemia, unspecified: Secondary | ICD-10-CM | POA: Diagnosis not present

## 2021-08-05 DIAGNOSIS — I739 Peripheral vascular disease, unspecified: Secondary | ICD-10-CM | POA: Diagnosis not present

## 2021-08-27 DIAGNOSIS — Z23 Encounter for immunization: Secondary | ICD-10-CM | POA: Diagnosis not present

## 2021-09-10 ENCOUNTER — Other Ambulatory Visit: Payer: Self-pay | Admitting: Internal Medicine

## 2021-09-10 DIAGNOSIS — Z1231 Encounter for screening mammogram for malignant neoplasm of breast: Secondary | ICD-10-CM

## 2021-10-21 ENCOUNTER — Ambulatory Visit
Admission: RE | Admit: 2021-10-21 | Discharge: 2021-10-21 | Disposition: A | Discharge status: Home / Self Care | Payer: Medicare Other | Source: Ambulatory Visit | Attending: Internal Medicine | Admitting: Internal Medicine

## 2021-10-21 DIAGNOSIS — Z1231 Encounter for screening mammogram for malignant neoplasm of breast: Secondary | ICD-10-CM | POA: Diagnosis not present

## 2021-10-25 DIAGNOSIS — Z0001 Encounter for general adult medical examination with abnormal findings: Secondary | ICD-10-CM | POA: Diagnosis not present

## 2021-10-25 DIAGNOSIS — Z131 Encounter for screening for diabetes mellitus: Secondary | ICD-10-CM | POA: Diagnosis not present

## 2021-10-25 DIAGNOSIS — E785 Hyperlipidemia, unspecified: Secondary | ICD-10-CM | POA: Diagnosis not present

## 2021-11-07 DIAGNOSIS — Z1211 Encounter for screening for malignant neoplasm of colon: Secondary | ICD-10-CM | POA: Diagnosis not present

## 2021-11-07 DIAGNOSIS — E785 Hyperlipidemia, unspecified: Secondary | ICD-10-CM | POA: Diagnosis not present

## 2021-11-07 DIAGNOSIS — Z23 Encounter for immunization: Secondary | ICD-10-CM | POA: Diagnosis not present

## 2021-11-07 DIAGNOSIS — J019 Acute sinusitis, unspecified: Secondary | ICD-10-CM | POA: Diagnosis not present

## 2021-11-07 DIAGNOSIS — Z13820 Encounter for screening for osteoporosis: Secondary | ICD-10-CM | POA: Diagnosis not present

## 2021-11-07 DIAGNOSIS — Z1231 Encounter for screening mammogram for malignant neoplasm of breast: Secondary | ICD-10-CM | POA: Diagnosis not present

## 2021-11-07 DIAGNOSIS — J302 Other seasonal allergic rhinitis: Secondary | ICD-10-CM | POA: Diagnosis not present

## 2021-11-07 DIAGNOSIS — M1990 Unspecified osteoarthritis, unspecified site: Secondary | ICD-10-CM | POA: Diagnosis not present

## 2021-11-07 DIAGNOSIS — I10 Essential (primary) hypertension: Secondary | ICD-10-CM | POA: Diagnosis not present

## 2021-11-07 DIAGNOSIS — N183 Chronic kidney disease, stage 3 unspecified: Secondary | ICD-10-CM | POA: Diagnosis not present

## 2021-11-07 DIAGNOSIS — R413 Other amnesia: Secondary | ICD-10-CM | POA: Diagnosis not present

## 2021-11-07 DIAGNOSIS — Z0001 Encounter for general adult medical examination with abnormal findings: Secondary | ICD-10-CM | POA: Diagnosis not present

## 2021-11-08 ENCOUNTER — Other Ambulatory Visit: Payer: Self-pay | Admitting: Internal Medicine

## 2021-11-08 DIAGNOSIS — Z13820 Encounter for screening for osteoporosis: Secondary | ICD-10-CM

## 2021-11-25 DIAGNOSIS — Z23 Encounter for immunization: Secondary | ICD-10-CM | POA: Diagnosis not present

## 2021-12-04 DIAGNOSIS — Z23 Encounter for immunization: Secondary | ICD-10-CM | POA: Diagnosis not present

## 2021-12-04 IMAGING — CR DG CHEST 2V
2 series · 2 of 2 positions shown · non-contrast
Comparison: 10/30/2011

CLINICAL DATA: 75-year-old female with atypical chest pain

EXAM:
CHEST - 2 VIEW

[w chest pa]
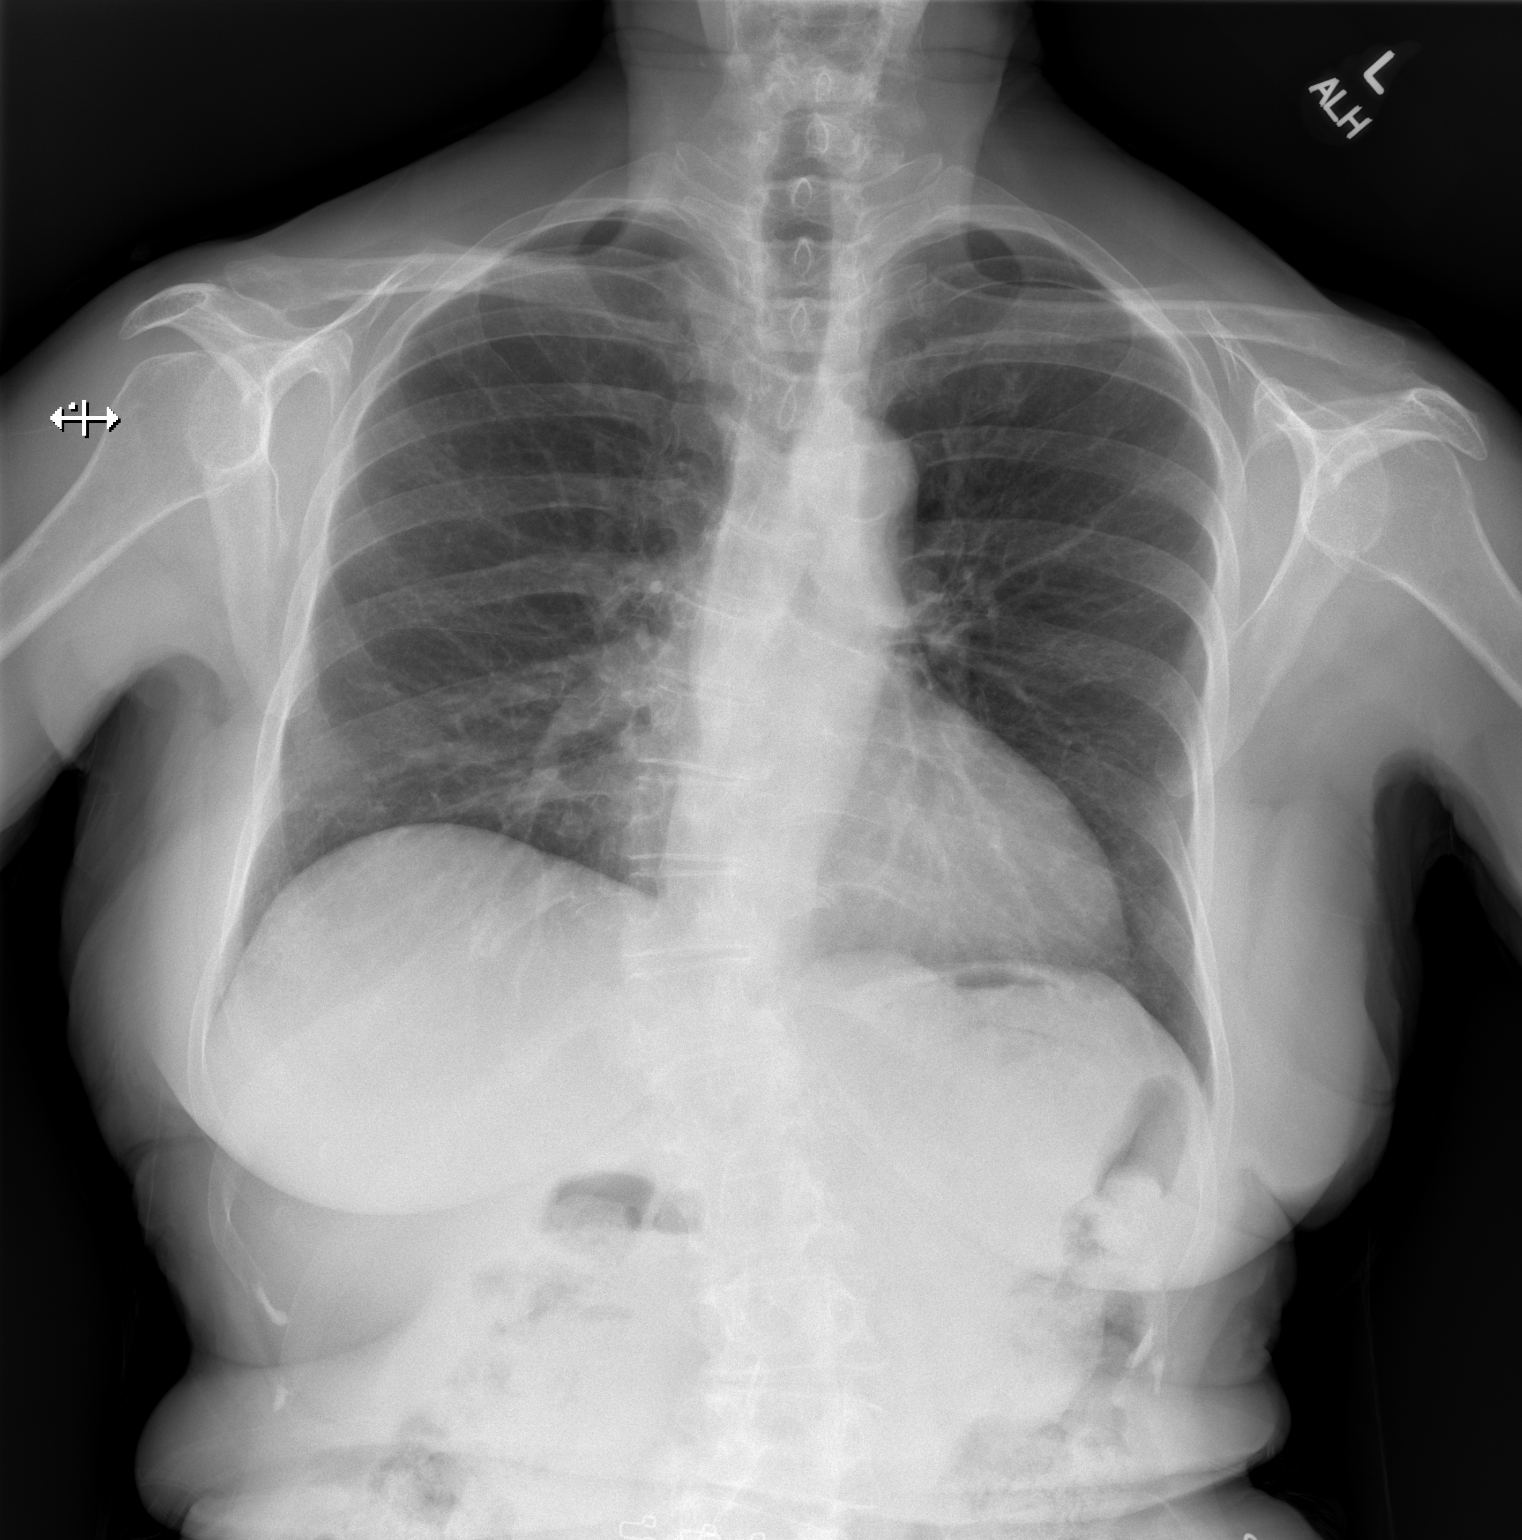

[w chest lat]
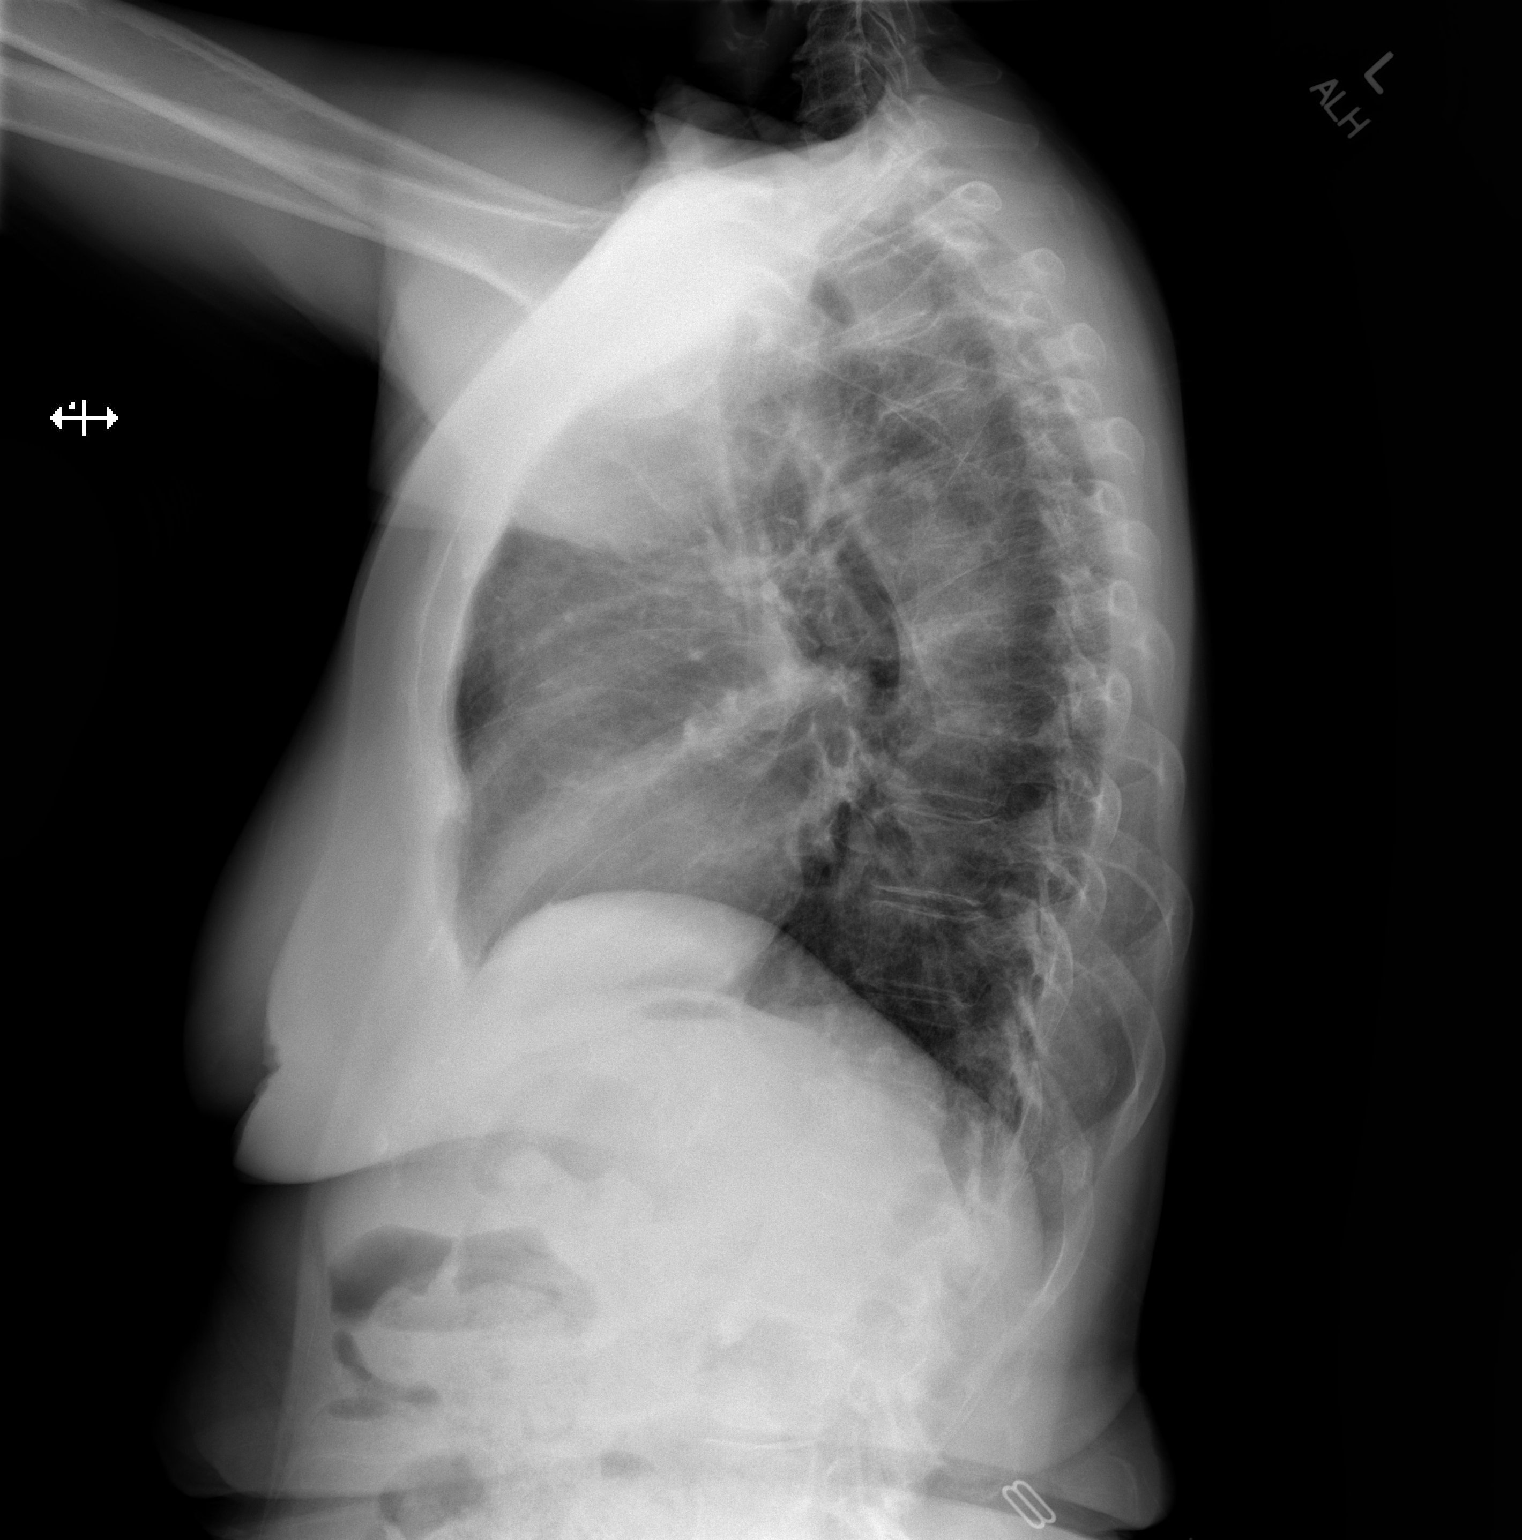

[2 of 2 positions shown; findings below may reference images not displayed]

FINDINGS: Cardiomediastinal silhouette unchanged in size and contour. No
evidence of central vascular congestion. No interlobular septal
thickening.

Low lung volumes with coarsened interstitial markings and no
confluent airspace disease.

No pneumothorax or pleural effusion.

No acute displaced fracture. Degenerative changes of the spine.

Scoliotic curvature
IMPRESSION: No active cardiopulmonary disease.

## 2021-12-12 DIAGNOSIS — Z23 Encounter for immunization: Secondary | ICD-10-CM | POA: Diagnosis not present

## 2021-12-23 DIAGNOSIS — M79645 Pain in left finger(s): Secondary | ICD-10-CM | POA: Diagnosis not present

## 2021-12-23 DIAGNOSIS — M79644 Pain in right finger(s): Secondary | ICD-10-CM | POA: Diagnosis not present

## 2021-12-23 DIAGNOSIS — M151 Heberden's nodes (with arthropathy): Secondary | ICD-10-CM | POA: Diagnosis not present

## 2021-12-23 DIAGNOSIS — M65322 Trigger finger, left index finger: Secondary | ICD-10-CM | POA: Diagnosis not present

## 2022-01-30 DIAGNOSIS — I10 Essential (primary) hypertension: Secondary | ICD-10-CM | POA: Diagnosis not present

## 2022-02-06 ENCOUNTER — Other Ambulatory Visit: Payer: Self-pay | Admitting: Internal Medicine

## 2022-02-06 DIAGNOSIS — N183 Chronic kidney disease, stage 3 unspecified: Secondary | ICD-10-CM

## 2022-02-06 DIAGNOSIS — Z23 Encounter for immunization: Secondary | ICD-10-CM | POA: Diagnosis not present

## 2022-02-06 DIAGNOSIS — R413 Other amnesia: Secondary | ICD-10-CM | POA: Diagnosis not present

## 2022-02-06 DIAGNOSIS — E785 Hyperlipidemia, unspecified: Secondary | ICD-10-CM | POA: Diagnosis not present

## 2022-02-06 DIAGNOSIS — F330 Major depressive disorder, recurrent, mild: Secondary | ICD-10-CM | POA: Diagnosis not present

## 2022-02-06 DIAGNOSIS — J302 Other seasonal allergic rhinitis: Secondary | ICD-10-CM | POA: Diagnosis not present

## 2022-02-06 DIAGNOSIS — I10 Essential (primary) hypertension: Secondary | ICD-10-CM | POA: Diagnosis not present

## 2022-02-17 ENCOUNTER — Ambulatory Visit
Admission: RE | Admit: 2022-02-17 | Discharge: 2022-02-17 | Disposition: A | Discharge status: Home / Self Care | Payer: Medicare Other | Source: Ambulatory Visit | Attending: Internal Medicine | Admitting: Internal Medicine

## 2022-02-17 DIAGNOSIS — N183 Chronic kidney disease, stage 3 unspecified: Secondary | ICD-10-CM

## 2022-03-20 ENCOUNTER — Ambulatory Visit: Payer: Medicare Other | Admitting: Student

## 2022-04-01 ENCOUNTER — Encounter: Payer: Self-pay | Admitting: Cardiology

## 2022-04-01 ENCOUNTER — Ambulatory Visit: Payer: Medicare Other | Admitting: Cardiology

## 2022-04-01 VITALS — BP 147/84 | HR 93 | Resp 16 | Ht 60.0 in | Wt 147.4 lb

## 2022-04-01 DIAGNOSIS — N1831 Chronic kidney disease, stage 3a: Secondary | ICD-10-CM

## 2022-04-01 DIAGNOSIS — I10 Essential (primary) hypertension: Secondary | ICD-10-CM

## 2022-04-01 DIAGNOSIS — E7800 Pure hypercholesterolemia, unspecified: Secondary | ICD-10-CM

## 2022-04-01 MED ORDER — AMLODIPINE BESYLATE 5 MG PO TABS: 5.0000 mg | ORAL_TABLET | ORAL | 0 refills | Status: DC

## 2022-04-01 MED ORDER — LOSARTAN POTASSIUM 100 MG PO TABS: 100.0000 mg | ORAL_TABLET | Freq: Every evening | ORAL | 0 refills | Status: DC

## 2022-04-01 NOTE — Progress Notes (Unsigned)
Primary Physician/Referring:  Audley Hose, MD  Patient ID: [name], female    DOB: [birth year], 77 y.o.   MRN: [id number]  Chief Complaint  Patient presents with   Hypertension   Follow-up    1 year   HPI:    [name]  is a 77 y.o. Caucasian female with hypertension, hyperlipidemia, primary hypertension, stage IIIa CKD  presents here for annual visit.  She exercises regularly doing swimming, cardio activity, and light weightlifting at [facility] without issue.  She has had no recurrence of chest pain.  Past Medical History:  Diagnosis Date   Allergy    Arthritis    Cataract    BILATERAL-REMOVED   Contact dermatitis and other eczema, due to unspecified cause    Depression    Encounter for long-term (current) use of other medications    GERD (gastroesophageal reflux disease)    Hyperlipidemia    Hypertension    Osteopenia    Other and unspecified hyperlipidemia    Pain in joint, ankle and foot    Screening for malignant neoplasm of the cervix    Past Surgical History:  Procedure Laterality Date   ANKLE SURGERY  05/2006   Right Ankle   Carpel TUnnel Release  2004,2006   Right(2004), Left(2006)   CATARACT EXTRACTION Left 10/03/14   CATARACT EXTRACTION Right 11/16/2014   COLONOSCOPY     DIAGNOSTIC LAPAROSCOPY  1980   FINGER SURGERY Right    ring finger   JOINT REPLACEMENT     Total Knee(L)-11/05/07   KNEE ARTHROPLASTY  05/2007   Left   RHINOPLASTY  1969   TONSILLECTOMY     @ 77 y.o.   TOTAL ANKLE ARTHROPLASTY  10/30/2011   Procedure: TOTAL ANKLE ARTHOPLASTY;  Surgeon: Wylene Simmer, MD;  Location: [place];  Service: Orthopedics;  Laterality: Right;  RIGHT TOTAL ANKLE REPLACEMENT, POSSIBLE GASTROC RESESSION   TOTAL KNEE ARTHROPLASTY Right 10/30/2017   Procedure: RIGHT TOTAL KNEE ARTHROPLASTY;  Surgeon: Netta Cedars, MD;  Location: [place];  Service: Orthopedics;  Laterality: Right;   TUBAL LIGATION  1980   Family History  Problem Relation Age  of Onset   Cancer Mother 32       breast   Breast cancer Mother    Stomach cancer Maternal Grandfather    Colon cancer Paternal Grandfather    Esophageal cancer Neg Hx    Rectal cancer Neg Hx     Social History   Tobacco Use   Smoking status: Former    Packs/day: 0.50    Years: 17.00    Total pack years: 8.50    Types: Cigarettes    Quit date: 12/17/1979    Years since quitting: 42.3   Smokeless tobacco: Never  Substance Use Topics   Alcohol use: Yes    Alcohol/week: 0.0 standard drinks of alcohol    Comment: occasional- socially    Marital Status: Married  ROS  Review of Systems  Cardiovascular:  Negative for chest pain, dyspnea on exertion and leg swelling.   Objective  Blood pressure (!) 147/84, pulse 93, resp. rate 16, height 5' (1.524 m), weight 147 lb 6.4 oz (66.9 kg), SpO2 98 %. Body mass index is 28.79 kg/m.     04/01/2022    2:26 PM 03/19/2021    9:32 AM 12/17/2020   12:58 PM  Vitals with BMI  Height 5' 0"$   5' 0"$   Weight 147 lbs 6 oz 145 lbs 146 lbs 6 oz  BMI 28.79  [id number]  Systolic Q000111Q [id number]  Diastolic 84 71 82  Pulse 93 80 80     Physical Exam Vitals reviewed.  Neck:     Vascular: No carotid bruit or JVD.  Cardiovascular:     Rate and Rhythm: Normal rate and regular rhythm.     Pulses: Intact distal pulses.     Heart sounds: Normal heart sounds. No murmur heard.    No gallop.  Pulmonary:     Effort: Pulmonary effort is normal.     Breath sounds: Normal breath sounds.  Musculoskeletal:     Right lower leg: No edema.     Left lower leg: No edema.    Laboratory examination:   External labs:   Labs 05/31/2021:  Serum glucose 87 mg, BUN 20, creatinine 1.08, EGFR 53 mL, potassium 4.6.  Labs 10/25/2021:  Hb 13.6/HCT 40.6, platelets 243, normal indicis.  Total cholesterol 156, triglycerides 46, HDL 80, LDL 63.   Allergies   Allergies  Allergen Reactions   Dog Epithelium Allergy Skin Test    Molds & Smuts    Morphine And Related Itching      Medication    Current Outpatient Medications:    acetaminophen (TYLENOL) 500 MG tablet, Take 500-1,000 mg by mouth as needed (for pain.). , Disp: , Rfl:    Azelastine HCl 0.15 % SOLN, Place 1-2 sprays into both nostrils as needed., Disp: , Rfl:    Biotin 5 MG TBDP, Take 1 tablet by mouth daily., Disp: , Rfl:    buPROPion (WELLBUTRIN XL) 300 MG 24 hr tablet, Take 300 mg by mouth daily., Disp: , Rfl:    Calcium-Phosphorus-Vitamin D (CALCIUM/D3 ADULT GUMMIES PO), Take 2 tablets by mouth daily., Disp: , Rfl:    Cholecalciferol (VITAMIN D3) 2000 units TABS, Take 2,000 Units by mouth daily., Disp: , Rfl:    famotidine (PEPCID) 20 MG tablet, Take 1 tablet (20 mg total) by mouth 2 (two) times daily., Disp: , Rfl:    fexofenadine (ALLEGRA) 180 MG tablet, Take 1 tablet by mouth as needed., Disp: , Rfl:    montelukast (SINGULAIR) 10 MG tablet, Take 10 mg by mouth at bedtime., Disp: , Rfl:    Multiple Vitamin (MULTIVITAMIN WITH MINERALS) TABS, Take 1 tablet by mouth daily., Disp: , Rfl:    OPCON-A 0.027-0.315 % SOLN, Place 1 drop into both eyes 2 (two) times daily as needed (allergy eyes)., Disp: , Rfl:    rosuvastatin (CRESTOR) 10 MG tablet, Take 1 tablet by mouth daily., Disp: , Rfl:    Turmeric (QC TUMERIC COMPLEX) 500 MG CAPS, Take 1 capsule by mouth daily., Disp: , Rfl:    amLODipine (NORVASC) 5 MG tablet, Take 1 tablet (5 mg total) by mouth every morning., Disp: 90 tablet, Rfl: 0   losartan (COZAAR) 100 MG tablet, Take 1 tablet (100 mg total) by mouth every evening., Disp: 90 tablet, Rfl: 0  Radiology:   Ultrasound of renals bilateral 02/17/2022: Renal measurements are normal with normal echogenicity in bilateral kidneys.  No mass or hydronephrosis noted.  Urinary bladder looks normal.  Cardiac Studies:   PCV CARDIAC STRESS TEST 12/07/2020 Functional status: Good. Chest pain: No. Reason for stopping exercise: Fatigue/weakness. Hypertensive response to exercise: No. Exercise time 7  minutes 59 seconds on Bruce protocol, achieved 10.12 METS, 112% of age-predicted maximum heart rate. Stress ECG negative for ischemia. Low risk study.  PCV ECHOCARDIOGRAM COMPLETE 99991111 Normal LV systolic function with visual EF 60-65%. Left ventricle cavity is normal in  size. Normal left ventricular wall thickness with basal septal hypertrophy. Normal global wall motion. No obvious regional wall motion abnormalities. Indeterminate diastolic filling pattern, normal LAP. Left atrial cavity is mildly dilated. Mild (Grade I) mitral regurgitation. Mild tricuspid regurgitation. No evidence of pulmonary hypertension. Structurally normal pulmonic valve. No evidence of pulmonic stenosis. Mild pulmonic regurgitation. No prior study for comparison.   EKG:   EKG 04/01/2022: Normal sinus rhythm at rate of 93 bpm, left atrial enlargement.  Left axis deviation, left anterior fascicular block.  IRBBB.  Normal QT interval.  No evidence of ischemia.  Compared to 03/19/2021, no significant change.   Assessment     ICD-10-CM   1. Essential hypertension  I10 EKG XX123456    Basic metabolic panel    PCV RENAL/RENAL ARTERY DUPLEX COMPLETE    2. Pure hypercholesterolemia  E78.00     3. Stage 3a chronic kidney disease (HCC)  123XX123 Basic metabolic panel    PCV RENAL/RENAL ARTERY DUPLEX COMPLETE       Medications Discontinued During This Encounter  Medication Reason   EPINEPHrine 0.3 mg/0.3 mL IJ SOAJ injection    losartan (COZAAR) 50 MG tablet Reorder   amLODipine (NORVASC) 2.5 MG tablet Reorder     Meds ordered this encounter  Medications   amLODipine (NORVASC) 5 MG tablet    Sig: Take 1 tablet (5 mg total) by mouth every morning.    Dispense:  90 tablet    Refill:  0    Send refills to Dr. Latanya Presser   losartan (COZAAR) 100 MG tablet    Sig: Take 1 tablet (100 mg total) by mouth every evening.    Dispense:  90 tablet    Refill:  0    Send refills to Dr. Bernadene Bell   Orders  Placed This Encounter  Procedures   Basic metabolic panel   EKG XX123456    Recommendations:   [name] is a 77 y.o. Caucasian female with hypertension, hyperlipidemia, primary hypertension, stage IIIa CKD  presents here for annual visit.  1. Essential hypertension ***  2. Pure hypercholesterolemia ***  3. Stage 3a chronic kidney disease ([place]) ***    Adrian Prows, MD, [facility] 04/01/2022, 2:55 PM Office: [phone] Fax: [phone] Pager: [phone]

## 2022-04-09 ENCOUNTER — Ambulatory Visit: Payer: Medicare Other

## 2022-04-09 DIAGNOSIS — N1831 Chronic kidney disease, stage 3a: Secondary | ICD-10-CM

## 2022-04-09 DIAGNOSIS — I10 Essential (primary) hypertension: Secondary | ICD-10-CM

## 2022-04-15 ENCOUNTER — Ambulatory Visit: Payer: No Typology Code available for payment source | Admitting: Nurse Practitioner

## 2022-04-15 LAB — BASIC METABOLIC PANEL
BUN/Creatinine Ratio: 15 (ref 12–28)
BUN: 17 mg/dL (ref 8–27)
CO2: 29 mmol/L (ref 20–29)
Calcium: 9.4 mg/dL (ref 8.7–10.3)
Chloride: 100 mmol/L (ref 96–106)
Creatinine, Ser: 1.12 mg/dL — ABNORMAL HIGH (ref 0.57–1.00)
Glucose: 85 mg/dL (ref 70–99)
Potassium: 4.6 mmol/L (ref 3.5–5.2)
Sodium: 137 mmol/L (ref 134–144)
eGFR: 50 mL/min/{1.73_m2} — ABNORMAL LOW (ref >59)

## 2022-04-15 NOTE — Progress Notes (Signed)
Labs 05/31/2021:  Serum glucose 87 mg, BUN 20, creatinine 1.08, EGFR 53 mL, potassium 4.6.  Hence serum creatinine is stable with stable EGFR, continue present medications.

## 2022-04-16 ENCOUNTER — Encounter: Payer: Self-pay | Admitting: Cardiology

## 2022-04-16 ENCOUNTER — Ambulatory Visit: Payer: Medicare Other | Admitting: Cardiology

## 2022-04-16 VITALS — BP 128/70 | HR 87 | Ht 60.0 in | Wt 144.2 lb

## 2022-04-16 DIAGNOSIS — N1831 Chronic kidney disease, stage 3a: Secondary | ICD-10-CM

## 2022-04-16 DIAGNOSIS — I10 Essential (primary) hypertension: Secondary | ICD-10-CM

## 2022-04-16 NOTE — Progress Notes (Signed)
Primary Physician/Referring:  Audley Hose, MD  Patient ID: [name], female    DOB: September 16, 1945, 77 y.o.   MRN: [id number]  Chief Complaint  Patient presents with   Essential hypertension   Stage 3a chronic kidney disease ([place])   Follow-up   HPI:    [name]  is a 77 y.o. Caucasian female with  hyperlipidemia, primary hypertension, stage IIIa CKD  presents here for follow-up of hypertension.  Recently she has had accelerated hypertension, underwent renal duplex and BMP and follow-up  She exercises regularly doing swimming, cardio activity, and light weightlifting at [facility] without issue.  She has had no recurrence of chest pain.  Past Medical History:  Diagnosis Date   Allergy    Arthritis    Cataract    BILATERAL-REMOVED   Contact dermatitis and other eczema, due to unspecified cause    Depression    Encounter for long-term (current) use of other medications    GERD (gastroesophageal reflux disease)    Hyperlipidemia    Hypertension    Osteopenia    Other and unspecified hyperlipidemia    Pain in joint, ankle and foot    Screening for malignant neoplasm of the cervix    Past Surgical History:  Procedure Laterality Date   ANKLE SURGERY  05/2006   Right Ankle   Carpel TUnnel Release  2004,2006   Right(2004), Left(2006)   CATARACT EXTRACTION Left 10/03/14   CATARACT EXTRACTION Right 11/16/2014   COLONOSCOPY     DIAGNOSTIC LAPAROSCOPY  1980   FINGER SURGERY Right    ring finger   JOINT REPLACEMENT     Total Knee(L)-11/05/07   KNEE ARTHROPLASTY  05/2007   Left   RHINOPLASTY  1969   TONSILLECTOMY     @ 77 y.o.   TOTAL ANKLE ARTHROPLASTY  10/30/2011   Procedure: TOTAL ANKLE ARTHOPLASTY;  Surgeon: Wylene Simmer, MD;  Location: [place];  Service: Orthopedics;  Laterality: Right;  RIGHT TOTAL ANKLE REPLACEMENT, POSSIBLE GASTROC RESESSION   TOTAL KNEE ARTHROPLASTY Right 10/30/2017   Procedure: RIGHT TOTAL KNEE ARTHROPLASTY;  Surgeon: Netta Cedars, MD;  Location: [place];  Service: Orthopedics;  Laterality: Right;   TUBAL LIGATION  1980    Social History   Tobacco Use   Smoking status: Former    Packs/day: 0.50    Years: 17.00    Total pack years: 8.50    Types: Cigarettes    Quit date: 12/17/1979    Years since quitting: 42.3   Smokeless tobacco: Never  Substance Use Topics   Alcohol use: Yes    Alcohol/week: 0.0 standard drinks of alcohol    Comment: occasional- socially    Marital Status: Married  ROS  Review of Systems  Cardiovascular:  Negative for chest pain, dyspnea on exertion and leg swelling.   Objective  Blood pressure 128/70, pulse 87, height 5' (1.524 m), weight 144 lb 3.2 oz (65.4 kg), SpO2 96 %. Body mass index is 28.16 kg/m.     04/16/2022   12:30 PM 04/01/2022    2:26 PM 03/19/2021    9:32 AM  Vitals with BMI  Height '5\' 0"'$  '5\' 0"'$    Weight 144 lbs 3 oz 147 lbs 6 oz 145 lbs  BMI 123XX123 [id number]   Systolic 0000000 Q000111Q 99991111  Diastolic 70 84 71  Pulse 87 93 80     Physical Exam Vitals reviewed.  Neck:     Vascular: No carotid bruit or JVD.  Cardiovascular:  Rate and Rhythm: Normal rate and regular rhythm.     Pulses: Intact distal pulses.     Heart sounds: Normal heart sounds. No murmur heard.    No gallop.  Pulmonary:     Effort: Pulmonary effort is normal.     Breath sounds: Normal breath sounds.  Musculoskeletal:     Right lower leg: No edema.     Left lower leg: No edema.    Laboratory examination:   Lab Results  Component Value Date   NA 137 04/15/2022   K 4.6 04/15/2022   CO2 29 04/15/2022   GLUCOSE 85 04/15/2022   BUN 17 04/15/2022   CREATININE 1.12 (H) 04/15/2022   CALCIUM 9.4 04/15/2022   EGFR 50 (L) 04/15/2022   GFRNONAA >60 10/31/2017    External labs:   Labs 05/31/2021:  Serum glucose 87 mg, BUN 20, creatinine 1.08, EGFR 53 mL, potassium 4.6.  Labs 10/25/2021:  Hb 13.6/HCT 40.6, platelets 243, normal indicis.  Total cholesterol 156, triglycerides 46, HDL 80, LDL  63.   Allergies   Allergies  Allergen Reactions   Dog Epithelium Allergy Skin Test    Molds & Smuts    Morphine And Related Itching     Medication    Current Outpatient Medications:    acetaminophen (TYLENOL) 500 MG tablet, Take 500-1,000 mg by mouth as needed (for pain.). , Disp: , Rfl:    amLODipine (NORVASC) 5 MG tablet, Take 1 tablet (5 mg total) by mouth every morning., Disp: 90 tablet, Rfl: 0   Biotin 5 MG TBDP, Take 1 tablet by mouth daily., Disp: , Rfl:    buPROPion (WELLBUTRIN XL) 300 MG 24 hr tablet, Take 300 mg by mouth daily., Disp: , Rfl:    Calcium-Phosphorus-Vitamin D (CALCIUM/D3 ADULT GUMMIES PO), Take 2 tablets by mouth daily., Disp: , Rfl:    Cholecalciferol (VITAMIN D3) 2000 units TABS, Take 2,000 Units by mouth daily., Disp: , Rfl:    famotidine (PEPCID) 20 MG tablet, Take 1 tablet (20 mg total) by mouth 2 (two) times daily., Disp: , Rfl:    fexofenadine (ALLEGRA) 180 MG tablet, Take 1 tablet by mouth as needed., Disp: , Rfl:    losartan (COZAAR) 100 MG tablet, Take 1 tablet (100 mg total) by mouth every evening., Disp: 90 tablet, Rfl: 0   montelukast (SINGULAIR) 10 MG tablet, Take 10 mg by mouth at bedtime., Disp: , Rfl:    Multiple Vitamin (MULTIVITAMIN WITH MINERALS) TABS, Take 1 tablet by mouth daily., Disp: , Rfl:    OPCON-A 0.027-0.315 % SOLN, Place 1 drop into both eyes 2 (two) times daily as needed (allergy eyes)., Disp: , Rfl:    rosuvastatin (CRESTOR) 10 MG tablet, Take 1 tablet by mouth daily., Disp: , Rfl:    Turmeric (QC TUMERIC COMPLEX) 500 MG CAPS, Take 1 capsule by mouth daily., Disp: , Rfl:   Radiology:   Ultrasound of renals bilateral 02/17/2022: Renal measurements are normal with normal echogenicity in bilateral kidneys.  No mass or hydronephrosis noted.  Urinary bladder looks normal.  Cardiac Studies:   PCV CARDIAC STRESS TEST 12/07/2020 Functional status: Good. Chest pain: No. Reason for stopping exercise: Fatigue/weakness. Hypertensive  response to exercise: No. Exercise time 7 minutes 59 seconds on Bruce protocol, achieved 10.12 METS, 112% of age-predicted maximum heart rate. Stress ECG negative for ischemia. Low risk study.  PCV ECHOCARDIOGRAM COMPLETE 99991111 Normal LV systolic function with visual EF 60-65%. Left ventricle cavity is normal in size. Normal left ventricular wall  thickness with basal septal hypertrophy. Normal global wall motion. No obvious regional wall motion abnormalities. Indeterminate diastolic filling pattern, normal LAP. Left atrial cavity is mildly dilated. Mild (Grade I) mitral regurgitation. Mild tricuspid regurgitation. No evidence of pulmonary hypertension. Structurally normal pulmonic valve. No evidence of pulmonic stenosis. Mild pulmonic regurgitation. No prior study for comparison.  Renal artery duplex  04/09/2022: No evidence of renal artery occlusive disease in either renal artery. Normal intrarenal vascular perfusion is noted in both kidneys. Renal length is within normal limits for both kidneys. Aortic atherosclerosis. Normal abdominal aorta flow velocities noted.   EKG:   EKG 04/01/2022: Normal sinus rhythm at rate of 93 bpm, left atrial enlargement.  Left axis deviation, left anterior fascicular block.  IRBBB.  Normal QT interval.  No evidence of ischemia.  Compared to 03/19/2021, no significant change.   Assessment     ICD-10-CM   1. Essential hypertension  I10     2. Stage 3a chronic kidney disease (HCC)  N18.31        Medications Discontinued During This Encounter  Medication Reason   Azelastine HCl 0.15 % SOLN Patient has not taken in last 30 days      No orders of the defined types were placed in this encounter.  No orders of the defined types were placed in this encounter.   Recommendations:   [name] is a 77 y.o. Caucasian female with  hyperlipidemia, primary hypertension, stage IIIa CKD  presents here for follow-up of hypertension.  Recently  she has had accelerated hypertension, underwent renal duplex and BMP and follow-up  1. Essential hypertension Since increasing the dose of amlodipine to 5 mg and also losartan from 50 mg to 100 mg daily, blood pressure is under excellent control and she remains asymptomatic.  Continue present medication.  Advised her that in case she has any dehydration issues like diarrhea, nausea, vomiting and any acute illness, to hold taking losartan during that time to avoid kidney injury.  2. Stage 3a chronic kidney disease ([place]) Patient was concerned about chronic kidney disease when she saw the BMP.  I went back and reviewed her labs all the way dating back to 2011, she has had stage IIIa chronic kidney disease.  Hence I reassured her.  Advised her continue with ARB will certainly help with maintaining her kidney function, she is already very strict with her diet.  Blood pressure is under excellent control and this should help with preserving her renal function as well.  Suspect her kidney disease was related to uncontrolled hypertension.  I will see her back on a as needed basis.   Adrian Prows, MD, [facility] 04/16/2022, 2:32 PM Office: [phone] Fax: [phone] Pager: [phone]

## 2022-04-17 ENCOUNTER — Ambulatory Visit (INDEPENDENT_AMBULATORY_CARE_PROVIDER_SITE_OTHER): Payer: Medicare Other | Admitting: Nurse Practitioner

## 2022-04-17 ENCOUNTER — Encounter: Payer: Self-pay | Admitting: Nurse Practitioner

## 2022-04-17 VITALS — BP 112/70 | HR 92 | Ht 60.75 in | Wt 146.0 lb

## 2022-04-17 DIAGNOSIS — R143 Flatulence: Secondary | ICD-10-CM

## 2022-04-17 DIAGNOSIS — K219 Gastro-esophageal reflux disease without esophagitis: Secondary | ICD-10-CM | POA: Diagnosis not present

## 2022-04-17 DIAGNOSIS — R1310 Dysphagia, unspecified: Secondary | ICD-10-CM

## 2022-04-17 DIAGNOSIS — R142 Eructation: Secondary | ICD-10-CM

## 2022-04-17 MED ORDER — PANTOPRAZOLE SODIUM 20 MG PO TBEC: DELAYED_RELEASE_TABLET | ORAL | 2 refills | Status: DC

## 2022-04-17 NOTE — Patient Instructions (Addendum)
_______________________________________________________  If your blood pressure at your visit was 140/90 or greater, please contact your primary care physician to follow up on this.  _______________________________________________________  If you are age 79 or older, your body mass index should be between 23-30. Your Body mass index is 27.81 kg/m. If this is out of the aforementioned range listed, please consider follow up with your Primary Care Provider.  If you are age 67 or younger, your body mass index should be between 19-25. Your Body mass index is 27.81 kg/m. If this is out of the aformentioned range listed, please consider follow up with your Primary Care Provider.   ________________________________________________________  The [facility] providers would like to encourage you to use [facility] to communicate with providers for non-urgent requests or questions.  Due to long hold times on the telephone, sending your provider a message by [facility] may be a faster and more efficient way to get a response.  Please allow 48 business hours for a response.  Please remember that this is for non-urgent requests.  _______________________________________________________  [name] have been scheduled for an endoscopy. Please follow written instructions given to you at your visit today. If you use inhalers (even only as needed), please bring them with you on the day of your procedure.  Due to recent changes in healthcare laws, you may see the results of your imaging and laboratory studies on MyChart before your provider has had a chance to review them.  We understand that in some cases there may be results that are confusing or concerning to you. Not all laboratory results come back in the same time frame and the provider may be waiting for multiple results in order to interpret others.  Please give [place] 48 hours in order for your provider to thoroughly review all the results before contacting the office for  clarification of your results.   Increase Famotidine to 20 mg twice daily at bed time  Purchase and take 1-2 tablets of Lactaid over the counter with each dairy product.  You have been scheduled for a Barium Esophogram at [facility] (1st floor of the hospital) on 04/24/22 at 1:00 pm. Please arrive @ 12:30 pm 30 minutes prior to your appointment for registration. Make certain not to have anything to eat or drink 3 hours prior to your test. If you need to reschedule for any reason, please contact radiology at [phone] to do so. __________________________________________________________________ A barium swallow is an examination that concentrates on views of the esophagus. This tends to be a double contrast exam (barium and two liquids which, when combined, create a gas to distend the wall of the oesophagus) or single contrast (non-ionic iodine based). The study is usually tailored to your symptoms so a good history is essential. Attention is paid during the study to the form, structure and configuration of the esophagus, looking for functional disorders (such as aspiration, dysphagia, achalasia, motility and reflux) EXAMINATION You may be asked to change into a gown, depending on the type of swallow being performed. A radiologist and radiographer will perform the procedure. The radiologist will advise you of the type of contrast selected for your procedure and direct you during the exam. You will be asked to stand, sit or lie in several different positions and to hold a small amount of fluid in your mouth before being asked to swallow while the imaging is performed .In some instances you may be asked to swallow barium coated marshmallows to assess the motility of a solid  food bolus. The exam can be recorded as a digital or video fluoroscopy procedure. POST PROCEDURE It will take 1-2 days for the barium to pass through your system. To facilitate this, it is important, unless otherwise  directed, to increase your fluids for the next 24-48hrs and to resume your normal diet.  This test typically takes about 30 minutes to perform. __________________________________________________________________________________

## 2022-04-17 NOTE — Progress Notes (Signed)
04/17/2022 Sabra Plantz [id number] 06-Apr-1945   CHIEF COMPLAINT: Abdominal gas   HISTORY OF PRESENT ILLNESS: [name] is a 77 year old female with a past medical history of depression, hypertension, hyperlipidemia, CKD, osteoarthritis and GERD. She is known by Dr. Carlean Purl. She presents today with complaints of having digestive problems including acid reflux. She has frequent heartburn with increased burping since the fall of 2023. She is taking Famotidine '20mg'$  bid and TUMS 2 - 6 tabs daily as needed. Sugar, bread and [place] foods products trigger reflux. She endorses having dysphagia which occurs once or twice weekly for the past 6 months. She describes eating food such as bread or steak which gets stuck in the upper esophagus and passes down the esophagus if she drinks water or waits a minute or two. No vomiting. She has a lot of abdominal gas with increased flatulence. She passes a normal formed BM daily. No rectal bleeding or black stools. She uses almond milk and eats greek yogurt and cottage cheese. She eats a lot of fruit. She takes a probiotic for the past few years. She traveled to [place] 12/2021 without experiencing any GI illness.   She underwent an EGD 12/22/2018 which showed a tortuous esophagus and a 1 - 2 cm hiatal hernia. Her most recent colonoscopy was 05/15/2016 which identified diverticulosis, no polyps. No further screening colonoscopies recommended due to age.      Latest Ref Rng & Units 04/15/2022    9:37 AM 10/31/2017    4:35 AM 09/18/2016    8:20 AM  CMP  Glucose 70 - 99 mg/dL 85  149  85   BUN 8 - 27 mg/dL '17  8  21   '$ Creatinine 0.57 - 1.00 mg/dL 1.12  0.62  0.93   Sodium 134 - 144 mmol/L 137  128  139   Potassium 3.5 - 5.2 mmol/L 4.6  3.9  4.5   Chloride 96 - 106 mmol/L 100  91  103   CO2 20 - 29 mmol/L '29  27  28   '$ Calcium 8.7 - 10.3 mg/dL 9.4  8.2  9.0   Total Protein 6.1 - 8.1 g/dL   6.3   Total Bilirubin 0.2 - 1.2 mg/dL   0.4    Alkaline Phos 33 - 130 U/L   73   AST 10 - 35 U/L   17   ALT 6 - 29 U/L   14     PAST GI PROCEDURES:  EGD 12/22/2018: - Tortuous esophagus.  - 1-2 cm sliding hiatal hernia.  - Gastroesophageal flap valve classified as Hill Grade III (minimal fold, loose to endoscope, hiatal hernia likely).  - The examination was otherwise normal.  - No specimens collected.   Colonoscopy 05/15/2016: - Diverticulosis in the sigmoid colon.  - The examination was otherwise normal on direct and retroflexion views. - No specimens collected. -No further colonoscopies due to age   Colonoscopy 01/06/2006: Sigmoid diverticulosis Internal hemorrhoids    Past Medical History:  Diagnosis Date   Allergy    Cataract    BILATERAL-REMOVED   Chronic kidney disease (CKD), stage III (moderate) (HCC)    Contact dermatitis and other eczema, due to unspecified cause    Depression    Encounter for long-term (current) use of other medications    GERD (gastroesophageal reflux disease)    Hyperlipidemia    Hypertension    Osteoarthritis    Osteopenia    Other and unspecified hyperlipidemia  Pain in joint, ankle and foot    Screening for malignant neoplasm of the cervix    Past Surgical History:  Procedure Laterality Date   Carpel TUnnel Release Bilateral 2004,2006   [phone]), Left(2006)   CATARACT EXTRACTION Left 10/03/2014   CATARACT EXTRACTION Right 11/16/2014   COLONOSCOPY     DIAGNOSTIC LAPAROSCOPY  1980   FINGER SURGERY Right    ring finger   KNEE ARTHROPLASTY Left 05/2007   Left   RHINOPLASTY  1969   TONSILLECTOMY     @ 77 y.o.   TOTAL ANKLE ARTHROPLASTY  10/30/2011   Procedure: TOTAL ANKLE ARTHOPLASTY;  Surgeon: Wylene Simmer, MD;  Location: [facility];  Service: Orthopedics;  Laterality: Right;  RIGHT TOTAL ANKLE REPLACEMENT, POSSIBLE GASTROC RESESSION   TOTAL KNEE ARTHROPLASTY Right 10/30/2017   Procedure: RIGHT TOTAL KNEE ARTHROPLASTY;  Surgeon: Netta Cedars, MD;  Location: [facility];  Service:  Orthopedics;  Laterality: Right;   TUBAL LIGATION  1980   Social History: She is married. Retired. She quit smoking 42 years ago. Infrequent alcohol use. No drug use.    Family History: Paternal grandfather had colon cancer. Father had heart disease/MI. Mother had breast cancer. Maternal grandfather died from stomach cancer. Sister with alcohol use disorder. Sister with rheumatoid arthritis.   Allergies  Allergen Reactions   Dog Epithelium (Canis Lupus Familiaris)    Molds & Smuts    Morphine And Related Itching      Outpatient Encounter Medications as of 04/17/2022  Medication Sig   acetaminophen (TYLENOL) 500 MG tablet Take 500-1,000 mg by mouth as needed (for pain.).    amLODipine (NORVASC) 5 MG tablet Take 1 tablet (5 mg total) by mouth every morning.   Biotin 5 MG TBDP Take 1 tablet by mouth daily.   buPROPion (WELLBUTRIN XL) 300 MG 24 hr tablet Take 300 mg by mouth daily.   Calcium-Phosphorus-Vitamin D (CALCIUM/D3 ADULT GUMMIES PO) Take 2 tablets by mouth daily.   Cholecalciferol (VITAMIN D3) 2000 units TABS Take 2,000 Units by mouth daily.   famotidine (PEPCID) 20 MG tablet Take 1 tablet (20 mg total) by mouth 2 (two) times daily.   fexofenadine (ALLEGRA) 180 MG tablet Take 1 tablet by mouth as needed.   losartan (COZAAR) 100 MG tablet Take 1 tablet (100 mg total) by mouth every evening.   montelukast (SINGULAIR) 10 MG tablet Take 10 mg by mouth at bedtime.   Multiple Vitamin (MULTIVITAMIN WITH MINERALS) TABS Take 1 tablet by mouth daily.   OPCON-A 0.027-0.315 % SOLN Place 1 drop into both eyes 2 (two) times daily as needed (allergy eyes).   rosuvastatin (CRESTOR) 10 MG tablet Take 1 tablet by mouth daily.   Turmeric (QC TUMERIC COMPLEX) 500 MG CAPS Take 1 capsule by mouth daily.   No facility-administered encounter medications on file as of 04/17/2022.    REVIEW OF SYSTEMS:  Gen: Denies fever, sweats or chills. No weight loss.  CV: Denies chest pain, palpitations or  edema. Resp: Denies cough, shortness of breath of hemoptysis.  GI: Denies heartburn, dysphagia, stomach or lower abdominal pain. No diarrhea or constipation.  GU : Denies urinary burning, blood in urine, increased urinary frequency or incontinence. MS:+ Arthritis. Derm: Denies rash, itchiness, skin lesions or unhealing ulcers. Psych: Mild depression.  Heme: Denies bruising, easy bleeding. Neuro:  Denies headaches, dizziness or paresthesias. Endo:  Denies any problems with DM, thyroid or adrenal function.  PHYSICAL EXAM: BP 112/70 (BP Location: Left Arm, Patient Position: Sitting, Cuff Size: Normal)  Pulse 92   Ht 5' 0.75" (1.543 m) Comment: height measured without shoes  Wt 146 lb (66.2 kg)   BMI 27.81 kg/m  General: 77 year old female in no acute distress. Head: Normocephalic and atraumatic. Eyes:  Sclerae non-icteric, conjunctive pink. Ears: Normal auditory acuity. Mouth: Dentition intact. No ulcers or lesions.  Neck: Supple, no lymphadenopathy or thyromegaly.  Lungs: Clear bilaterally to auscultation without wheezes, crackles or rhonchi. Heart: Regular rate and rhythm. No murmur, rub or gallop appreciated.  Abdomen: Soft, nontender, nondistended. No masses. No hepatosplenomegaly. Normoactive bowel sounds x 4 quadrants.  Rectal: Deferred.  Musculoskeletal: Symmetrical with no gross deformities. Skin: Warm and dry. No rash or lesions on visible extremities. Extremities: No edema. Neurological: Alert oriented x 4, no focal deficits.  Psychological:  Alert and cooperative. Normal mood and affect.  ASSESSMENT AND PLAN:  77 year old female with recurrence of GERD symptoms and new onset dysphagia x 6 months. EGD 12/2018 showed a tortuous esophagus and a small hiatal hernia.  -Pantoprazole '20mg'$  Q AM to be taken 30 minutes before breakfast. Famotidine '20mg'$  two tabs Q HS.  -Barium swallow study to rule out esophageal dysmotility  -EGD with possible esophageal dilatation, benefits and  risks discussed including risk with sedation, risk of bleeding, perforation and infection  -Further recommendations to be determined after the above evaluation completed   Abdominal gas, increased gas per there rectum  -Consider stopping probiotic -Lactaid 1 to 2 tabs with each dairy product   CKD. Cr 1.12. GFR 50.         CC:  Audley Hose, MD

## 2022-04-24 ENCOUNTER — Ambulatory Visit (HOSPITAL_COMMUNITY)
Admission: RE | Admit: 2022-04-24 | Discharge: 2022-04-24 | Disposition: A | Discharge status: Home / Self Care | Payer: Medicare Other | Source: Ambulatory Visit | Attending: Nurse Practitioner | Admitting: Nurse Practitioner

## 2022-04-24 ENCOUNTER — Other Ambulatory Visit: Payer: Self-pay | Admitting: Nurse Practitioner

## 2022-04-24 DIAGNOSIS — K219 Gastro-esophageal reflux disease without esophagitis: Secondary | ICD-10-CM

## 2022-04-24 DIAGNOSIS — R142 Eructation: Secondary | ICD-10-CM

## 2022-04-24 DIAGNOSIS — R1310 Dysphagia, unspecified: Secondary | ICD-10-CM

## 2022-04-24 DIAGNOSIS — R143 Flatulence: Secondary | ICD-10-CM

## 2022-04-29 ENCOUNTER — Ambulatory Visit: Payer: Medicare Other | Admitting: Cardiology

## 2022-05-15 ENCOUNTER — Other Ambulatory Visit: Payer: Medicare Other

## 2022-05-26 ENCOUNTER — Ambulatory Visit (AMBULATORY_SURGERY_CENTER): Payer: Medicare Other | Admitting: Internal Medicine

## 2022-05-26 ENCOUNTER — Encounter: Payer: Self-pay | Admitting: Internal Medicine

## 2022-05-26 VITALS — BP 114/69 | HR 74 | Temp 97.5°F | Resp 17 | Ht 60.0 in | Wt 146.0 lb

## 2022-05-26 DIAGNOSIS — K3189 Other diseases of stomach and duodenum: Secondary | ICD-10-CM | POA: Diagnosis not present

## 2022-05-26 DIAGNOSIS — K222 Esophageal obstruction: Secondary | ICD-10-CM | POA: Diagnosis not present

## 2022-05-26 DIAGNOSIS — R1310 Dysphagia, unspecified: Secondary | ICD-10-CM

## 2022-05-26 DIAGNOSIS — K219 Gastro-esophageal reflux disease without esophagitis: Secondary | ICD-10-CM

## 2022-05-26 DIAGNOSIS — K2950 Unspecified chronic gastritis without bleeding: Secondary | ICD-10-CM | POA: Diagnosis not present

## 2022-05-26 MED ORDER — SODIUM CHLORIDE 0.9 % IV SOLN: 500.0000 mL | Freq: Once | INTRAVENOUS | Status: DC

## 2022-05-26 MED ORDER — PANTOPRAZOLE SODIUM 40 MG PO TBEC: 40.0000 mg | DELAYED_RELEASE_TABLET | Freq: Every day | ORAL | 3 refills | Status: DC

## 2022-05-26 NOTE — Progress Notes (Signed)
[facility] History and Physical   Primary Care Physician:  Harvest Forest, MD   Reason for Procedure:   Dysphagia   Plan:    EGD, esophageal dilation     HPI: [name] is a 77 y.o. female seen 3/7 by Alcide Evener NP - having dysphagia, GERD sxs and gas. EGD 202 w. Small hiatal hernia and tortuous esophagus   Ba swallow 3/14  IMPRESSION: 1. 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   2. Given question of granular mucosal pattern the possibility of Barrett's esophagus would be difficult to exclude. Endoscopic evaluation may be helpful as warranted for further assessment.   3. Schatzki is ring without impeded passage of a 13 mm barium tablet. This may explain to some extent however the presence of globus sensation with certain foods.   4.  Small hiatal hernia   Past Surgical History:  Procedure Laterality Date   Carpel TUnnel Release Bilateral 2004,2006   [phone]), Left(2006)   CATARACT EXTRACTION Left 10/03/2014   CATARACT EXTRACTION Right 11/16/2014   COLONOSCOPY     DIAGNOSTIC LAPAROSCOPY  1980   FINGER SURGERY Right    ring finger   KNEE ARTHROPLASTY Left 05/2007   Left   RHINOPLASTY  1969   TONSILLECTOMY     @ 77 y.o.   TOTAL ANKLE ARTHROPLASTY  10/30/2011   Procedure: TOTAL ANKLE ARTHOPLASTY;  Surgeon: Toni Arthurs, MD;  Location: [facility];  Service: Orthopedics;  Laterality: Right;  RIGHT TOTAL ANKLE REPLACEMENT, POSSIBLE GASTROC RESESSION   TOTAL KNEE ARTHROPLASTY Right 10/30/2017   Procedure: RIGHT TOTAL KNEE ARTHROPLASTY;  Surgeon: Beverely Low, MD;  Location: [facility];  Service: Orthopedics;  Laterality: Right;   TUBAL LIGATION  1980   UPPER GASTROINTESTINAL ENDOSCOPY      Prior to Admission medications   Medication Sig Start Date End Date Taking? Authorizing Provider  acetaminophen (TYLENOL) 500 MG tablet  Take 500-1,000 mg by mouth as needed (for pain.).    Yes [provider]  amLODipine (NORVASC) 5 MG tablet Take 1 tablet (5 mg total) by mouth every morning. 04/01/22 06/30/22 Yes Yates Decamp, MD  Biotin 5 MG TBDP Take 1 tablet by mouth daily.   Yes [provider]  buPROPion (WELLBUTRIN XL) 300 MG 24 hr tablet Take 300 mg by mouth daily.   Yes [provider]  Calcium-Phosphorus-Vitamin D (CALCIUM/D3 ADULT GUMMIES PO) Take 2 tablets by mouth daily.   Yes [provider]  Cholecalciferol (VITAMIN D3) 2000 units TABS Take 2,000 Units by mouth daily.   Yes [provider]  famotidine (PEPCID) 20 MG tablet Take 1 tablet (20 mg total) by mouth 2 (two) times daily. 11/05/20  Yes Yates Decamp, MD  fexofenadine (ALLEGRA) 180 MG tablet Take 1 tablet by mouth as needed.   Yes [provider]  losartan (COZAAR) 100 MG tablet Take 1 tablet (100 mg total) by mouth every evening. 04/01/22  Yes Yates Decamp, MD  montelukast (SINGULAIR) 10 MG tablet Take 10 mg by mouth at bedtime.   Yes [provider]  rosuvastatin (CRESTOR) 10 MG tablet Take 1 tablet by mouth daily.   Yes [provider]  Turmeric (QC TUMERIC COMPLEX) 500 MG CAPS Take 1 capsule by mouth daily.   Yes [provider]  Multiple Vitamin (MULTIVITAMIN WITH MINERALS) TABS Take 1 tablet by mouth daily. Patient not taking: Reported on 05/26/2022    [provider]  OPCON-A 0.027-0.315 % SOLN Place 1 drop into both eyes 2 (two) times daily as needed (allergy eyes).    [provider]  pantoprazole (PROTONIX) 20 MG tablet Take one tablet 30 minutes before breakfast 04/17/22   Arnaldo Natal, NP    Current Outpatient Medications  Medication Sig Dispense Refill   acetaminophen (TYLENOL) 500 MG tablet Take 500-1,000 mg by mouth as needed (for pain.).      amLODipine (NORVASC) 5 MG tablet Take 1 tablet (5 mg total) by mouth every morning. 90 tablet 0   Biotin  5 MG TBDP Take 1 tablet by mouth daily.     buPROPion (WELLBUTRIN XL) 300 MG 24 hr tablet Take 300 mg by mouth daily.     Calcium-Phosphorus-Vitamin D (CALCIUM/D3 ADULT GUMMIES PO) Take 2 tablets by mouth daily.     Cholecalciferol (VITAMIN D3) 2000 units TABS Take 2,000 Units by mouth daily.     famotidine (PEPCID) 20 MG tablet Take 1 tablet (20 mg total) by mouth 2 (two) times daily.     fexofenadine (ALLEGRA) 180 MG tablet Take 1 tablet by mouth as needed.     losartan (COZAAR) 100 MG tablet Take 1 tablet (100 mg total) by mouth every evening. 90 tablet 0   montelukast (SINGULAIR) 10 MG tablet Take 10 mg by mouth at bedtime.     rosuvastatin (CRESTOR) 10 MG tablet Take 1 tablet by mouth daily.     Turmeric (QC TUMERIC COMPLEX) 500 MG CAPS Take 1 capsule by mouth daily.     Multiple Vitamin (MULTIVITAMIN WITH MINERALS) TABS Take 1 tablet by mouth daily. (Patient not taking: Reported on 05/26/2022)     OPCON-A 0.027-0.315 % SOLN Place 1 drop into both eyes 2 (two) times daily as needed (allergy eyes).     pantoprazole (PROTONIX) 20 MG tablet Take one tablet 30 minutes before breakfast 30 tablet 2   Current Facility-Administered Medications  Medication Dose Route Frequency Provider Last Rate Last Admin   0.9 %  sodium chloride infusion  500 mL Intravenous Once Iva Boop, MD        Allergies as of 05/26/2022 - Review Complete 05/26/2022  Allergen Reaction Noted   Dog epithelium (canis lupus familiaris)  11/05/2020   Molds & smuts  11/05/2020   Morphine and related Itching 10/23/2011    Family History  Problem Relation Age of Onset   Breast cancer Mother 68   Dementia Mother    Heart attack Father    Heart disease Father    Rheum arthritis Sister    Alcoholism Sister        recovering   Dementia Maternal Grandmother    Stomach cancer Maternal Grandfather        29's   Other Paternal Grandmother        deceased from the flu   Colon cancer Paternal Grandfather    Heart  disease Paternal Grandfather    Esophageal cancer Neg Hx    Rectal cancer Neg Hx     Social History   Socioeconomic History   Marital status: Married    Spouse name: Not on file   Number of children: 0   Years of education: Not on file   Highest education level: Not on file  Occupational History   Occupation: retired  Tobacco Use   Smoking status: Former    Packs/day: 0.50    Years: 17.00    Additional pack years: 0.00    Total pack years:  8.50    Types: Cigarettes    Quit date: 12/17/1979    Years since quitting: 42.4   Smokeless tobacco: Never  Vaping Use   Vaping Use: Never used  Substance and Sexual Activity   Alcohol use: Yes    Alcohol/week: 0.0 standard drinks of alcohol    Comment: occasional- socially    Drug use: No   Sexual activity: Not Currently  Other Topics Concern   Not on file  Social History Narrative   Not on file   Social Determinants of Health   Financial Resource Strain: Low Risk  (09/15/2017)   Overall Financial Resource Strain (CARDIA)    Difficulty of Paying Living Expenses: Not hard at all  Food Insecurity: No Food Insecurity (09/15/2017)   Hunger Vital Sign    Worried About Running Out of Food in the Last Year: Never true    Ran Out of Food in the Last Year: Never true  Transportation Needs: No Transportation Needs (09/15/2017)   PRAPARE - Administrator, Civil Service (Medical): No    Lack of Transportation (Non-Medical): No  Physical Activity: Sufficiently Active (09/15/2017)   Exercise Vital Sign    Days of Exercise per Week: 7 days    Minutes of Exercise per Session: 50 min  Stress: No Stress Concern Present (09/15/2017)   [employer] of Occupational Health - Occupational Stress Questionnaire    Feeling of Stress : Only a little  Social Connections: Socially Integrated (09/15/2017)   Social Connection and Isolation Panel [NHANES]    Frequency of Communication with Friends and Family: More than three times a week     Frequency of Social Gatherings with Friends and Family: More than three times a week    Attends Religious Services: More than 4 times per year    Active Member of [employer] or Organizations: Yes    Attends Engineer, structural: More than 4 times per year    Marital Status: Married  Catering manager Violence: Not At Risk (09/15/2017)   Humiliation, Afraid, Rape, and Kick questionnaire    Fear of Current or Ex-Partner: No    Emotionally Abused: No    Physically Abused: No    Sexually Abused: No    Review of Systems:  All other review of systems negative except as mentioned in the HPI.  Physical Exam: Vital signs BP 116/72   Pulse 78   Temp (!) 97.5 F (36.4 C) (Temporal)   Ht 5' (1.524 m)   Wt 146 lb (66.2 kg)   SpO2 97%   BMI 28.51 kg/m   General:   Alert,  Well-developed, well-nourished, pleasant and cooperative in NAD Lungs:  Clear throughout to auscultation.   Heart:  Regular rate and rhythm; no murmurs, clicks, rubs,  or gallops. Abdomen:  Soft, nontender and nondistended. Normal bowel sounds.   Neuro/Psych:  Alert and cooperative. Normal mood and affect. A and O x 3    Sena Slate, MD, [facility] [phone] (pager) 05/26/2022 8:05 AM@

## 2022-05-26 NOTE — Progress Notes (Signed)
Report to PACU, RN, vss, BBS= Clear.  

## 2022-05-26 NOTE — Progress Notes (Signed)
Pt's states no medical or surgical changes since previsit or office visit. 

## 2022-05-26 NOTE — Patient Instructions (Addendum)
I dilated the esophagus - it should help you swallow better. Also sampled the stomach lining - looked irritated. ? Infection.  Changing medications - stop Pepcid (famotidine), change pantoprazole to 40 mg daily. Rx sent.  I appreciate the opportunity to care for you. Iva Boop, MD, FACG    YOU HAD AN ENDOSCOPIC PROCEDURE TODAY AT THE [facility]:   Refer to the procedure report that was given to you for any specific questions about what was found during the examination.  If the procedure report does not answer your questions, please call your gastroenterologist to clarify.  If you requested that your care partner not be given the details of your procedure findings, then the procedure report has been included in a sealed envelope for you to review at your convenience later.  YOU SHOULD EXPECT: Some feelings of bloating in the abdomen. Passage of more gas than usual.  Walking can help get rid of the air that was put into your GI tract during the procedure and reduce the bloating. If you had a lower endoscopy (such as a colonoscopy or flexible sigmoidoscopy) you may notice spotting of blood in your stool or on the toilet paper. If you underwent a bowel prep for your procedure, you may not have a normal bowel movement for a few days.  Please Note:  You might notice some irritation and congestion in your nose or some drainage.  This is from the oxygen used during your procedure.  There is no need for concern and it should clear up in a day or so.  SYMPTOMS TO REPORT IMMEDIATELY:  Following upper endoscopy (EGD)  Vomiting of blood or coffee ground material  New chest pain or pain under the shoulder blades  Painful or persistently difficult swallowing  New shortness of breath  Fever of 100F or higher  Black, tarry-looking stools  For urgent or emergent issues, a gastroenterologist can be reached at any hour by calling [phone]. Do not use MyChart messaging for urgent  concerns.    DIET:  We do recommend a small meal at first, but then you may proceed to your regular diet.  Drink plenty of fluids but you should avoid alcoholic beverages for 24 hours.  ACTIVITY:  You should plan to take it easy for the rest of today and you should NOT DRIVE or use heavy machinery until tomorrow (because of the sedation medicines used during the test).    FOLLOW UP: Our staff will call the number listed on your records the next business day following your procedure.  We will call around 7:15- 8:00 am to check on you and address any questions or concerns that you may have regarding the information given to you following your procedure. If we do not reach you, we will leave a message.     If any biopsies were taken you will be contacted by phone or by letter within the next 1-3 weeks.  Please call us at [phone] if you have not heard about the biopsies in 3 weeks.    SIGNATURES/CONFIDENTIALITY: You and/or your care partner have signed paperwork which will be entered into your electronic medical record.  These signatures attest to the fact that that the information above on your After Visit Summary has been reviewed and is understood.  Full responsibility of the confidentiality of this discharge information lies with you and/or your care-partner.

## 2022-05-26 NOTE — Progress Notes (Signed)
Called to room to assist during endoscopic procedure.  Patient ID and intended procedure confirmed with present staff. Received instructions for my participation in the procedure from the performing physician.  

## 2022-05-26 NOTE — Op Note (Signed)
[facility] Patient Name: [name] Procedure Date: 05/26/2022 8:04 AM MRN: [id number] Endoscopist: Iva Boop , MD, [id number] Age: 77 Referring MD:  Date of Birth: 01-Dec-1945 Gender: Female Account #: [id number] Procedure:                Upper GI endoscopy Indications:              Dysphagia, Esophageal reflux symptoms that persist                            despite appropriate therapy Medicines:                Monitored Anesthesia Care Procedure:                Pre-Anesthesia Assessment:                           - Prior to the procedure, a History and Physical                            was performed, and patient medications and                            allergies were reviewed. The patient's tolerance of                            previous anesthesia was also reviewed. The risks                            and benefits of the procedure and the sedation                            options and risks were discussed with the patient.                            All questions were answered, and informed consent                            was obtained. Prior Anticoagulants: The patient has                            taken no anticoagulant or antiplatelet agents. ASA                            Grade Assessment: II - A patient with mild systemic                            disease. After reviewing the risks and benefits,                            the patient was deemed in satisfactory condition to                            undergo the procedure.  After obtaining informed consent, the endoscope was                            passed under direct vision. Throughout the                            procedure, the patient's blood pressure, pulse, and                            oxygen saturations were monitored continuously. The                            GIF [id number] #[id number] was introduced through the                            mouth, and advanced to  the second part of duodenum.                            The upper GI endoscopy was accomplished without                            difficulty. The patient tolerated the procedure                            well. Scope In: Scope Out: Findings:                 The examined esophagus was mildly tortuous.                           A moderate Schatzki ring was found at the                            gastroesophageal junction. A TTS dilator was passed                            through the scope. Dilation with an 18-19-20 mm                            balloon dilator was performed to 20 mm. The                            dilation site was examined and showed mild mucosal                            disruption. Estimated blood loss was minimal.                           A 2 cm hiatal hernia was present.                           The gastroesophageal flap valve was visualized                            endoscopically and  classified as Hill Grade III                            (minimal fold, loose to endoscope, hiatal hernia                            likely).                           Diffuse mild mucosal changes characterized by                            discoloration and erythema were found in the entire                            examined stomach. Biopsies were taken with a cold                            forceps for histology. Verification of patient                            identification for the specimen was done. Estimated                            blood loss was minimal.                           The exam was otherwise without abnormality.                           The cardia and gastric fundus were normal on                            retroflexion. Complications:            No immediate complications. Estimated Blood Loss:     Estimated blood loss was minimal. Impression:               - Tortuous esophagus.                           - Moderate Schatzki ring. Dilated.                            - 2 cm hiatal hernia.                           - Gastroesophageal flap valve classified as Hill                            Grade III (minimal fold, loose to endoscope, hiatal                            hernia likely).                           - Discolored and erythematous mucosa in the  stomach. Biopsied.                           - The examination was otherwise normal. Recommendation:           - Patient has a contact number available for                            emergencies. The signs and symptoms of potential                            delayed complications were discussed with the                            patient. Return to normal activities tomorrow.                            Written discharge instructions were provided to the                            patient.                           - Clear liquids x 1 hour then soft foods rest of                            day. Start prior diet tomorrow.                           - Continue GERD diet                           DC famotidine and 20 mg pantoprazole (still having                            some heartburn)                           Take 40 mg pantoprazole daily AM - Rx sent                           Await pathology Iva Boop, MD 05/26/2022 8:31:17 AM This report has been signed electronically.

## 2022-05-27 ENCOUNTER — Telehealth: Payer: Self-pay | Admitting: *Deleted

## 2022-05-27 NOTE — Telephone Encounter (Signed)
  Follow up Call-     05/26/2022    7:19 AM  Call back number  Post procedure Call Back phone  # [phone]  Permission to leave phone message Yes     Patient questions:  Do you have a fever, pain , or abdominal swelling? No. Pain Score  0 *  Have you tolerated food without any problems? Yes.    Have you been able to return to your normal activities? Yes.    Do you have any questions about your discharge instructions: Diet   No. Medications  No. Follow up visit  No.  Do you have questions or concerns about your Care? No.  Actions: * If pain score is 4 or above: No action needed, pain <4.

## 2022-06-07 ENCOUNTER — Encounter: Payer: Self-pay | Admitting: Internal Medicine

## 2022-06-27 ENCOUNTER — Other Ambulatory Visit: Payer: Self-pay | Admitting: Cardiology

## 2022-08-06 ENCOUNTER — Inpatient Hospital Stay: Admission: RE | Admit: 2022-08-06 | Payer: Medicare Other | Source: Ambulatory Visit

## 2022-08-11 ENCOUNTER — Other Ambulatory Visit: Payer: Self-pay | Admitting: Internal Medicine

## 2022-08-11 DIAGNOSIS — K7689 Other specified diseases of liver: Secondary | ICD-10-CM

## 2022-09-11 ENCOUNTER — Ambulatory Visit
Admission: RE | Admit: 2022-09-11 | Discharge: 2022-09-11 | Disposition: A | Discharge status: Home / Self Care | Payer: Medicare Other | Source: Ambulatory Visit | Attending: Internal Medicine | Admitting: Internal Medicine

## 2022-09-11 DIAGNOSIS — K7689 Other specified diseases of liver: Secondary | ICD-10-CM

## 2022-09-24 ENCOUNTER — Other Ambulatory Visit: Payer: Self-pay | Admitting: Cardiology

## 2022-10-09 ENCOUNTER — Other Ambulatory Visit: Payer: Self-pay | Admitting: Internal Medicine

## 2022-10-09 DIAGNOSIS — Z1231 Encounter for screening mammogram for malignant neoplasm of breast: Secondary | ICD-10-CM

## 2022-10-14 ENCOUNTER — Other Ambulatory Visit: Payer: Self-pay | Admitting: Medical Genetics

## 2022-10-14 DIAGNOSIS — Z006 Encounter for examination for normal comparison and control in clinical research program: Secondary | ICD-10-CM

## 2022-10-30 ENCOUNTER — Other Ambulatory Visit: Payer: Self-pay | Admitting: Internal Medicine

## 2022-10-30 DIAGNOSIS — Z13820 Encounter for screening for osteoporosis: Secondary | ICD-10-CM

## 2022-11-06 ENCOUNTER — Ambulatory Visit: Payer: Medicare Other

## 2022-11-13 ENCOUNTER — Ambulatory Visit
Admission: RE | Admit: 2022-11-13 | Discharge: 2022-11-13 | Disposition: A | Discharge status: Home / Self Care | Payer: Medicare Other | Source: Ambulatory Visit | Attending: Internal Medicine | Admitting: Internal Medicine

## 2022-11-13 DIAGNOSIS — Z1231 Encounter for screening mammogram for malignant neoplasm of breast: Secondary | ICD-10-CM

## 2022-11-26 ENCOUNTER — Other Ambulatory Visit (HOSPITAL_COMMUNITY)
Admission: RE | Admit: 2022-11-26 | Discharge: 2022-11-26 | Disposition: A | Discharge status: Home / Self Care | Payer: Medicare Other | Source: Ambulatory Visit | Attending: Medical Genetics | Admitting: Medical Genetics

## 2022-11-26 DIAGNOSIS — Z006 Encounter for examination for normal comparison and control in clinical research program: Secondary | ICD-10-CM | POA: Insufficient documentation

## 2022-12-05 LAB — HELIX MOLECULAR SCREEN: Genetic Analysis Overall Interpretation: NEGATIVE

## 2022-12-24 ENCOUNTER — Other Ambulatory Visit: Payer: Self-pay | Admitting: Cardiology

## 2023-05-20 ENCOUNTER — Other Ambulatory Visit: Payer: Self-pay | Admitting: Internal Medicine

## 2023-06-22 ENCOUNTER — Other Ambulatory Visit: Payer: Self-pay | Admitting: Cardiology

## 2023-07-30 ENCOUNTER — Other Ambulatory Visit: Payer: Self-pay | Admitting: Cardiology

## 2023-08-13 ENCOUNTER — Other Ambulatory Visit: Payer: Self-pay | Admitting: Cardiology

## 2023-09-11 ENCOUNTER — Other Ambulatory Visit: Payer: Self-pay | Admitting: Internal Medicine

## 2023-09-11 DIAGNOSIS — K7689 Other specified diseases of liver: Secondary | ICD-10-CM

## 2023-09-30 ENCOUNTER — Ambulatory Visit
Admission: RE | Admit: 2023-09-30 | Discharge: 2023-09-30 | Disposition: A | Discharge status: Home / Self Care | Source: Ambulatory Visit | Attending: Internal Medicine | Admitting: Internal Medicine

## 2023-09-30 DIAGNOSIS — K7689 Other specified diseases of liver: Secondary | ICD-10-CM

## 2023-10-20 ENCOUNTER — Other Ambulatory Visit: Payer: Self-pay | Admitting: Internal Medicine

## 2023-10-20 DIAGNOSIS — Z1231 Encounter for screening mammogram for malignant neoplasm of breast: Secondary | ICD-10-CM

## 2023-11-18 ENCOUNTER — Ambulatory Visit
Admission: RE | Admit: 2023-11-18 | Discharge: 2023-11-18 | Disposition: A | Source: Ambulatory Visit | Attending: Internal Medicine | Admitting: Internal Medicine

## 2023-11-18 DIAGNOSIS — Z1231 Encounter for screening mammogram for malignant neoplasm of breast: Secondary | ICD-10-CM

## 2023-11-23 ENCOUNTER — Other Ambulatory Visit: Payer: Self-pay | Admitting: Internal Medicine

## 2023-11-23 DIAGNOSIS — R928 Other abnormal and inconclusive findings on diagnostic imaging of breast: Secondary | ICD-10-CM

## 2023-12-03 ENCOUNTER — Encounter

## 2023-12-03 ENCOUNTER — Other Ambulatory Visit

## 2023-12-03 ENCOUNTER — Ambulatory Visit
Admission: RE | Admit: 2023-12-03 | Discharge: 2023-12-03 | Disposition: A | Discharge status: Home / Self Care | Source: Ambulatory Visit | Attending: Internal Medicine | Admitting: Internal Medicine

## 2023-12-03 ENCOUNTER — Ambulatory Visit

## 2023-12-03 DIAGNOSIS — R928 Other abnormal and inconclusive findings on diagnostic imaging of breast: Secondary | ICD-10-CM
